# Patient Record
Sex: Female | Born: 1935 | Race: White | Hispanic: No | Smoking: Never smoker
Health system: Southern US, Community
[De-identification: ages and names within clinical notes are randomized; demographics above are authoritative.]

## PROBLEM LIST (undated history)

## (undated) DIAGNOSIS — K219 Gastro-esophageal reflux disease without esophagitis: Secondary | ICD-10-CM

## (undated) DIAGNOSIS — R55 Syncope and collapse: Secondary | ICD-10-CM

## (undated) DIAGNOSIS — R911 Solitary pulmonary nodule: Secondary | ICD-10-CM

## (undated) DIAGNOSIS — R112 Nausea with vomiting, unspecified: Secondary | ICD-10-CM

## (undated) DIAGNOSIS — Z9889 Other specified postprocedural states: Secondary | ICD-10-CM

## (undated) DIAGNOSIS — I639 Cerebral infarction, unspecified: Secondary | ICD-10-CM

## (undated) DIAGNOSIS — K559 Vascular disorder of intestine, unspecified: Secondary | ICD-10-CM

## (undated) DIAGNOSIS — I509 Heart failure, unspecified: Secondary | ICD-10-CM

## (undated) DIAGNOSIS — I2510 Atherosclerotic heart disease of native coronary artery without angina pectoris: Secondary | ICD-10-CM

## (undated) DIAGNOSIS — I10 Essential (primary) hypertension: Secondary | ICD-10-CM

## (undated) DIAGNOSIS — K5792 Diverticulitis of intestine, part unspecified, without perforation or abscess without bleeding: Secondary | ICD-10-CM

## (undated) DIAGNOSIS — E785 Hyperlipidemia, unspecified: Secondary | ICD-10-CM

## (undated) DIAGNOSIS — I4891 Unspecified atrial fibrillation: Secondary | ICD-10-CM

## (undated) DIAGNOSIS — I252 Old myocardial infarction: Secondary | ICD-10-CM

## (undated) HISTORY — PX: KNEE SURGERY: SHX244

## (undated) HISTORY — DX: Heart failure, unspecified: I50.9

## (undated) HISTORY — DX: Hyperlipidemia, unspecified: E78.5

## (undated) HISTORY — DX: Gastro-esophageal reflux disease without esophagitis: K21.9

## (undated) HISTORY — DX: Solitary pulmonary nodule: R91.1

## (undated) HISTORY — DX: Diverticulitis of intestine, part unspecified, without perforation or abscess without bleeding: K57.92

## (undated) HISTORY — DX: Vascular disorder of intestine, unspecified: K55.9

## (undated) HISTORY — DX: Syncope and collapse: R55

## (undated) HISTORY — DX: Unspecified atrial fibrillation: I48.91

## (undated) HISTORY — DX: Atherosclerotic heart disease of native coronary artery without angina pectoris: I25.10

---

## 1998-01-28 ENCOUNTER — Inpatient Hospital Stay (HOSPITAL_COMMUNITY): Admission: AD | Admit: 1998-01-28 | Discharge: 1998-02-01 | Payer: Self-pay | Admitting: Internal Medicine

## 1999-06-18 ENCOUNTER — Inpatient Hospital Stay (HOSPITAL_COMMUNITY): Admission: EM | Admit: 1999-06-18 | Discharge: 1999-06-20 | Payer: Self-pay | Admitting: Cardiovascular Disease

## 2000-07-24 ENCOUNTER — Inpatient Hospital Stay (HOSPITAL_COMMUNITY): Admission: AD | Admit: 2000-07-24 | Discharge: 2000-07-26 | Payer: Self-pay | Admitting: Cardiology

## 2000-07-24 ENCOUNTER — Encounter: Payer: Self-pay | Admitting: Internal Medicine

## 2001-11-06 ENCOUNTER — Encounter: Payer: Self-pay | Admitting: Internal Medicine

## 2001-11-06 ENCOUNTER — Ambulatory Visit (HOSPITAL_COMMUNITY): Admission: RE | Admit: 2001-11-06 | Discharge: 2001-11-06 | Payer: Self-pay | Admitting: Internal Medicine

## 2001-12-29 ENCOUNTER — Inpatient Hospital Stay (HOSPITAL_COMMUNITY): Admission: EM | Admit: 2001-12-29 | Discharge: 2002-01-01 | Payer: Self-pay | Admitting: *Deleted

## 2001-12-29 ENCOUNTER — Encounter: Payer: Self-pay | Admitting: *Deleted

## 2001-12-29 ENCOUNTER — Encounter: Payer: Self-pay | Admitting: Internal Medicine

## 2002-05-22 ENCOUNTER — Ambulatory Visit (HOSPITAL_COMMUNITY): Admission: RE | Admit: 2002-05-22 | Discharge: 2002-05-22 | Payer: Self-pay | Admitting: Orthopedic Surgery

## 2002-05-22 ENCOUNTER — Encounter: Payer: Self-pay | Admitting: Orthopedic Surgery

## 2002-05-26 ENCOUNTER — Inpatient Hospital Stay (HOSPITAL_COMMUNITY): Admission: RE | Admit: 2002-05-26 | Discharge: 2002-05-28 | Payer: Self-pay | Admitting: Orthopedic Surgery

## 2002-11-11 ENCOUNTER — Ambulatory Visit (HOSPITAL_COMMUNITY): Admission: RE | Admit: 2002-11-11 | Discharge: 2002-11-11 | Payer: Self-pay | Admitting: Pulmonary Disease

## 2003-05-13 ENCOUNTER — Ambulatory Visit (HOSPITAL_COMMUNITY): Admission: RE | Admit: 2003-05-13 | Discharge: 2003-05-13 | Payer: Self-pay | Admitting: Urology

## 2003-11-12 ENCOUNTER — Ambulatory Visit (HOSPITAL_COMMUNITY): Admission: RE | Admit: 2003-11-12 | Discharge: 2003-11-12 | Payer: Self-pay | Admitting: Internal Medicine

## 2004-04-09 DIAGNOSIS — K559 Vascular disorder of intestine, unspecified: Secondary | ICD-10-CM

## 2004-04-09 HISTORY — PX: COLONOSCOPY: SHX174

## 2004-04-09 HISTORY — DX: Vascular disorder of intestine, unspecified: K55.9

## 2004-08-06 ENCOUNTER — Ambulatory Visit: Payer: Self-pay | Admitting: Cardiovascular Disease

## 2004-08-06 ENCOUNTER — Inpatient Hospital Stay (HOSPITAL_COMMUNITY): Admission: AD | Admit: 2004-08-06 | Discharge: 2004-08-07 | Payer: Self-pay | Admitting: Cardiovascular Disease

## 2004-08-06 ENCOUNTER — Encounter: Payer: Self-pay | Admitting: Emergency Medicine

## 2004-08-14 ENCOUNTER — Ambulatory Visit: Payer: Self-pay | Admitting: Cardiovascular Disease

## 2004-11-13 ENCOUNTER — Ambulatory Visit (HOSPITAL_COMMUNITY): Admission: RE | Admit: 2004-11-13 | Discharge: 2004-11-13 | Payer: Self-pay | Admitting: Internal Medicine

## 2004-11-14 ENCOUNTER — Ambulatory Visit: Payer: Self-pay | Admitting: Cardiology

## 2004-12-05 ENCOUNTER — Ambulatory Visit: Payer: Self-pay | Admitting: Cardiovascular Disease

## 2005-01-21 ENCOUNTER — Ambulatory Visit: Payer: Self-pay | Admitting: Internal Medicine

## 2005-01-22 ENCOUNTER — Inpatient Hospital Stay (HOSPITAL_COMMUNITY): Admission: EM | Admit: 2005-01-22 | Discharge: 2005-01-24 | Payer: Self-pay | Admitting: Emergency Medicine

## 2005-01-23 ENCOUNTER — Encounter (INDEPENDENT_AMBULATORY_CARE_PROVIDER_SITE_OTHER): Payer: Self-pay | Admitting: Internal Medicine

## 2005-02-08 ENCOUNTER — Ambulatory Visit (HOSPITAL_COMMUNITY): Admission: RE | Admit: 2005-02-08 | Discharge: 2005-02-08 | Payer: Self-pay | Admitting: Internal Medicine

## 2005-11-12 ENCOUNTER — Ambulatory Visit (HOSPITAL_COMMUNITY): Admission: RE | Admit: 2005-11-12 | Discharge: 2005-11-12 | Payer: Self-pay | Admitting: Internal Medicine

## 2005-12-12 ENCOUNTER — Ambulatory Visit (HOSPITAL_COMMUNITY): Admission: RE | Admit: 2005-12-12 | Discharge: 2005-12-12 | Payer: Self-pay | Admitting: Internal Medicine

## 2006-12-16 ENCOUNTER — Ambulatory Visit (HOSPITAL_COMMUNITY): Admission: RE | Admit: 2006-12-16 | Discharge: 2006-12-16 | Payer: Self-pay | Admitting: Internal Medicine

## 2007-02-14 ENCOUNTER — Emergency Department (HOSPITAL_COMMUNITY): Admission: EM | Admit: 2007-02-14 | Discharge: 2007-02-14 | Payer: Self-pay | Admitting: Emergency Medicine

## 2007-10-10 ENCOUNTER — Emergency Department (HOSPITAL_COMMUNITY): Admission: EM | Admit: 2007-10-10 | Discharge: 2007-10-10 | Payer: Self-pay | Admitting: Emergency Medicine

## 2007-12-18 ENCOUNTER — Ambulatory Visit (HOSPITAL_COMMUNITY): Admission: RE | Admit: 2007-12-18 | Discharge: 2007-12-18 | Payer: Self-pay | Admitting: Internal Medicine

## 2008-04-25 ENCOUNTER — Emergency Department (HOSPITAL_COMMUNITY): Admission: EM | Admit: 2008-04-25 | Discharge: 2008-04-25 | Payer: Self-pay | Admitting: Emergency Medicine

## 2008-12-04 ENCOUNTER — Inpatient Hospital Stay (HOSPITAL_COMMUNITY): Admission: EM | Admit: 2008-12-04 | Discharge: 2008-12-07 | Payer: Self-pay | Admitting: Emergency Medicine

## 2009-02-08 ENCOUNTER — Ambulatory Visit (HOSPITAL_COMMUNITY): Admission: RE | Admit: 2009-02-08 | Discharge: 2009-02-08 | Payer: Self-pay | Admitting: Internal Medicine

## 2009-03-15 ENCOUNTER — Ambulatory Visit: Payer: Self-pay | Admitting: Cardiology

## 2009-03-16 ENCOUNTER — Ambulatory Visit: Payer: Self-pay | Admitting: Vascular Surgery

## 2009-03-16 ENCOUNTER — Inpatient Hospital Stay (HOSPITAL_COMMUNITY): Admission: EM | Admit: 2009-03-16 | Discharge: 2009-03-24 | Payer: Self-pay | Admitting: Emergency Medicine

## 2009-03-16 ENCOUNTER — Encounter (INDEPENDENT_AMBULATORY_CARE_PROVIDER_SITE_OTHER): Payer: Self-pay | Admitting: Internal Medicine

## 2009-03-18 ENCOUNTER — Ambulatory Visit: Payer: Self-pay | Admitting: Physical Medicine & Rehabilitation

## 2009-03-24 ENCOUNTER — Inpatient Hospital Stay (HOSPITAL_COMMUNITY)
Admission: RE | Admit: 2009-03-24 | Discharge: 2009-04-06 | Payer: Self-pay | Admitting: Physical Medicine & Rehabilitation

## 2009-03-25 ENCOUNTER — Ambulatory Visit: Payer: Self-pay | Admitting: Physical Medicine & Rehabilitation

## 2009-04-12 ENCOUNTER — Ambulatory Visit (HOSPITAL_COMMUNITY): Admission: RE | Admit: 2009-04-12 | Discharge: 2009-04-12 | Payer: Self-pay | Admitting: Internal Medicine

## 2009-11-17 ENCOUNTER — Emergency Department (HOSPITAL_COMMUNITY): Admission: EM | Admit: 2009-11-17 | Discharge: 2009-11-17 | Payer: Self-pay | Admitting: Internal Medicine

## 2010-04-30 ENCOUNTER — Encounter: Payer: Self-pay | Admitting: Internal Medicine

## 2010-04-30 ENCOUNTER — Encounter: Payer: Self-pay | Admitting: Physical Medicine & Rehabilitation

## 2010-05-01 ENCOUNTER — Encounter: Payer: Self-pay | Admitting: Internal Medicine

## 2010-06-23 LAB — URINE CULTURE
Colony Count: >=100000
Culture  Setup Time: 201108120044

## 2010-06-23 LAB — BASIC METABOLIC PANEL
BUN: 22 mg/dL (ref 6–23)
CO2: 28 mEq/L (ref 19–32)
Chloride: 104 mEq/L (ref 96–112)
Creatinine, Ser: 1.3 mg/dL — ABNORMAL HIGH (ref 0.4–1.2)

## 2010-06-23 LAB — DIFFERENTIAL
Basophils Absolute: 0 10*3/uL (ref 0.0–0.1)
Basophils Relative: 0 % (ref 0–1)
Eosinophils Relative: 5 % (ref 0–5)
Monocytes Absolute: 1.1 10*3/uL — ABNORMAL HIGH (ref 0.1–1.0)

## 2010-06-23 LAB — CBC
HCT: 34.1 % — ABNORMAL LOW (ref 36.0–46.0)
MCH: 28.1 pg (ref 26.0–34.0)
MCHC: 33 g/dL (ref 30.0–36.0)
MCV: 85.1 fL (ref 78.0–100.0)
RDW: 17.9 % — ABNORMAL HIGH (ref 11.5–15.5)

## 2010-06-23 LAB — POCT CARDIAC MARKERS

## 2010-06-23 LAB — URINE MICROSCOPIC-ADD ON

## 2010-06-23 LAB — URINALYSIS, ROUTINE W REFLEX MICROSCOPIC
Bilirubin Urine: NEGATIVE
Ketones, ur: NEGATIVE mg/dL
Nitrite: POSITIVE — AB
Urobilinogen, UA: 0.2 mg/dL (ref 0.0–1.0)
pH: 5.5 (ref 5.0–8.0)

## 2010-07-10 LAB — URINALYSIS, ROUTINE W REFLEX MICROSCOPIC
Bilirubin Urine: NEGATIVE
Hgb urine dipstick: NEGATIVE
Ketones, ur: NEGATIVE mg/dL
Protein, ur: 100 mg/dL — AB
Urobilinogen, UA: 0.2 mg/dL (ref 0.0–1.0)

## 2010-07-10 LAB — BASIC METABOLIC PANEL
BUN: 12 mg/dL (ref 6–23)
CO2: 27 mEq/L (ref 19–32)
CO2: 28 mEq/L (ref 19–32)
Calcium: 8.7 mg/dL (ref 8.4–10.5)
Calcium: 8.9 mg/dL (ref 8.4–10.5)
Chloride: 101 mEq/L (ref 96–112)
Chloride: 102 mEq/L (ref 96–112)
Chloride: 114 mEq/L — ABNORMAL HIGH (ref 96–112)
Creatinine, Ser: 0.7 mg/dL (ref 0.4–1.2)
Creatinine, Ser: 0.84 mg/dL (ref 0.4–1.2)
Creatinine, Ser: 0.88 mg/dL (ref 0.4–1.2)
GFR calc Af Amer: >60 mL/min (ref >60)
GFR calc non Af Amer: >60 mL/min (ref >60)
GFR calc non Af Amer: >60 mL/min (ref >60)
Glucose, Bld: 105 mg/dL — ABNORMAL HIGH (ref 70–99)
Glucose, Bld: 105 mg/dL — ABNORMAL HIGH (ref 70–99)
Glucose, Bld: 123 mg/dL — ABNORMAL HIGH (ref 70–99)
Glucose, Bld: 128 mg/dL — ABNORMAL HIGH (ref 70–99)
Potassium: 3.4 mEq/L — ABNORMAL LOW (ref 3.5–5.1)
Potassium: 3.4 mEq/L — ABNORMAL LOW (ref 3.5–5.1)
Potassium: 3.7 mEq/L (ref 3.5–5.1)
Sodium: 136 mEq/L (ref 135–145)
Sodium: 138 mEq/L (ref 135–145)

## 2010-07-10 LAB — URINE CULTURE: Colony Count: 1000

## 2010-07-10 LAB — COMPREHENSIVE METABOLIC PANEL
BUN: 13 mg/dL (ref 6–23)
CO2: 28 mEq/L (ref 19–32)
Calcium: 8.4 mg/dL (ref 8.4–10.5)
Chloride: 109 mEq/L (ref 96–112)
Creatinine, Ser: 0.69 mg/dL (ref 0.4–1.2)
GFR calc non Af Amer: >60 mL/min (ref >60)
Glucose, Bld: 103 mg/dL — ABNORMAL HIGH (ref 70–99)
Total Bilirubin: 0.6 mg/dL (ref 0.3–1.2)

## 2010-07-10 LAB — CBC
HCT: 30 % — ABNORMAL LOW (ref 36.0–46.0)
HCT: 30.5 % — ABNORMAL LOW (ref 36.0–46.0)
HCT: 30.6 % — ABNORMAL LOW (ref 36.0–46.0)
HCT: 32.6 % — ABNORMAL LOW (ref 36.0–46.0)
Hemoglobin: 9.8 g/dL — ABNORMAL LOW (ref 12.0–15.0)
Hemoglobin: 9.9 g/dL — ABNORMAL LOW (ref 12.0–15.0)
Hemoglobin: 9.9 g/dL — ABNORMAL LOW (ref 12.0–15.0)
MCV: 79.1 fL (ref 78.0–100.0)
MCV: 79.6 fL (ref 78.0–100.0)
MCV: 80.4 fL (ref 78.0–100.0)
MCV: 80.6 fL (ref 78.0–100.0)
Platelets: 212 10*3/uL (ref 150–400)
Platelets: 249 10*3/uL (ref 150–400)
RBC: 3.73 MIL/uL — ABNORMAL LOW (ref 3.87–5.11)
RBC: 3.79 MIL/uL — ABNORMAL LOW (ref 3.87–5.11)
RDW: 16.4 % — ABNORMAL HIGH (ref 11.5–15.5)
RDW: 17 % — ABNORMAL HIGH (ref 11.5–15.5)
WBC: 14.4 10*3/uL — ABNORMAL HIGH (ref 4.0–10.5)
WBC: 8.2 10*3/uL (ref 4.0–10.5)
WBC: 8.4 10*3/uL (ref 4.0–10.5)

## 2010-07-10 LAB — DIFFERENTIAL
Basophils Relative: 0 % (ref 0–1)
Lymphocytes Relative: 15 % (ref 12–46)
Monocytes Relative: 8 % (ref 3–12)
Neutro Abs: 6 10*3/uL (ref 1.7–7.7)
Neutrophils Relative %: 73 % (ref 43–77)

## 2010-07-10 LAB — PROTIME-INR: INR: 1.58 — ABNORMAL HIGH (ref 0.00–1.49)

## 2010-07-10 LAB — CULTURE, BLOOD (ROUTINE X 2)
Culture: NO GROWTH
Culture: NO GROWTH

## 2010-07-11 LAB — PROTIME-INR
INR: 1.15 (ref 0.00–1.49)
INR: 1.45 (ref 0.00–1.49)
INR: 1.74 — ABNORMAL HIGH (ref 0.00–1.49)
INR: 1.84 — ABNORMAL HIGH (ref 0.00–1.49)
INR: 1.93 — ABNORMAL HIGH (ref 0.00–1.49)
INR: 1.99 — ABNORMAL HIGH (ref 0.00–1.49)
Prothrombin Time: 14.6 seconds (ref 11.6–15.2)
Prothrombin Time: 17.5 seconds — ABNORMAL HIGH (ref 11.6–15.2)
Prothrombin Time: 21.1 seconds — ABNORMAL HIGH (ref 11.6–15.2)
Prothrombin Time: 22.4 seconds — ABNORMAL HIGH (ref 11.6–15.2)

## 2010-07-11 LAB — BRAIN NATRIURETIC PEPTIDE: Pro B Natriuretic peptide (BNP): 844 pg/mL — ABNORMAL HIGH (ref 0.0–100.0)

## 2010-07-11 LAB — CARDIAC PANEL(CRET KIN+CKTOT+MB+TROPI)
CK, MB: 1.1 ng/mL (ref 0.3–4.0)
Relative Index: 0.6 (ref 0.0–2.5)
Total CK: 106 U/L (ref 7–177)
Total CK: 159 U/L (ref 7–177)
Total CK: 166 U/L (ref 7–177)
Total CK: 200 U/L — ABNORMAL HIGH (ref 7–177)
Troponin I: 0.02 ng/mL (ref 0.00–0.06)
Troponin I: 0.03 ng/mL (ref 0.00–0.06)
Troponin I: 0.03 ng/mL (ref 0.00–0.06)

## 2010-07-11 LAB — COMPREHENSIVE METABOLIC PANEL
BUN: 18 mg/dL (ref 6–23)
Calcium: 8.8 mg/dL (ref 8.4–10.5)
Creatinine, Ser: 1.05 mg/dL (ref 0.4–1.2)
Glucose, Bld: 132 mg/dL — ABNORMAL HIGH (ref 70–99)
Total Protein: 6.9 g/dL (ref 6.0–8.3)

## 2010-07-11 LAB — CBC
HCT: 31.6 % — ABNORMAL LOW (ref 36.0–46.0)
Hemoglobin: 10.3 g/dL — ABNORMAL LOW (ref 12.0–15.0)
Hemoglobin: 9.4 g/dL — ABNORMAL LOW (ref 12.0–15.0)
MCHC: 32 g/dL (ref 30.0–36.0)
MCHC: 32.9 g/dL (ref 30.0–36.0)
MCHC: 32.9 g/dL (ref 30.0–36.0)
MCV: 80.7 fL (ref 78.0–100.0)
Platelets: 197 10*3/uL (ref 150–400)
Platelets: 200 10*3/uL (ref 150–400)
Platelets: 258 10*3/uL (ref 150–400)
RBC: 3.42 MIL/uL — ABNORMAL LOW (ref 3.87–5.11)
RBC: 3.56 MIL/uL — ABNORMAL LOW (ref 3.87–5.11)
RBC: 3.93 MIL/uL (ref 3.87–5.11)
RDW: 15.8 % — ABNORMAL HIGH (ref 11.5–15.5)
RDW: 16 % — ABNORMAL HIGH (ref 11.5–15.5)
WBC: 7.5 10*3/uL (ref 4.0–10.5)

## 2010-07-11 LAB — BASIC METABOLIC PANEL
BUN: 11 mg/dL (ref 6–23)
BUN: 15 mg/dL (ref 6–23)
BUN: 21 mg/dL (ref 6–23)
CO2: 25 mEq/L (ref 19–32)
CO2: 27 mEq/L (ref 19–32)
Calcium: 8.3 mg/dL — ABNORMAL LOW (ref 8.4–10.5)
Calcium: 8.4 mg/dL (ref 8.4–10.5)
Calcium: 8.5 mg/dL (ref 8.4–10.5)
Calcium: 8.9 mg/dL (ref 8.4–10.5)
Chloride: 107 mEq/L (ref 96–112)
Creatinine, Ser: 0.96 mg/dL (ref 0.4–1.2)
Creatinine, Ser: 1.08 mg/dL (ref 0.4–1.2)
GFR calc Af Amer: 58 mL/min — ABNORMAL LOW (ref >60)
GFR calc Af Amer: >60 mL/min (ref >60)
GFR calc Af Amer: >60 mL/min (ref >60)
GFR calc non Af Amer: >60 mL/min (ref >60)
Glucose, Bld: 107 mg/dL — ABNORMAL HIGH (ref 70–99)
Potassium: 2.9 mEq/L — ABNORMAL LOW (ref 3.5–5.1)
Sodium: 140 mEq/L (ref 135–145)
Sodium: 147 mEq/L — ABNORMAL HIGH (ref 135–145)

## 2010-07-11 LAB — DIFFERENTIAL
Lymphs Abs: 1.6 10*3/uL (ref 0.7–4.0)
Monocytes Relative: 13 % — ABNORMAL HIGH (ref 3–12)
Neutro Abs: 4.2 10*3/uL (ref 1.7–7.7)
Neutrophils Relative %: 60 % (ref 43–77)

## 2010-07-11 LAB — CK TOTAL AND CKMB (NOT AT ARMC)
CK, MB: 1.8 ng/mL (ref 0.3–4.0)
Total CK: 100 U/L (ref 7–177)
Total CK: 120 U/L (ref 7–177)

## 2010-07-11 LAB — IRON AND TIBC
Iron: 19 ug/dL — ABNORMAL LOW (ref 42–135)
Saturation Ratios: 6 % — ABNORMAL LOW (ref 20–55)
TIBC: 340 ug/dL (ref 250–470)

## 2010-07-11 LAB — TSH: TSH: 6.563 u[IU]/mL — ABNORMAL HIGH (ref 0.350–4.500)

## 2010-07-11 LAB — LIPID PANEL: VLDL: 21 mg/dL (ref 0–40)

## 2010-07-11 LAB — VITAMIN B12: Vitamin B-12: 1306 pg/mL — ABNORMAL HIGH (ref 211–911)

## 2010-07-11 LAB — HEMOGLOBIN A1C: Mean Plasma Glucose: 120 mg/dL

## 2010-07-11 LAB — APTT: aPTT: 30 seconds (ref 24–37)

## 2010-07-15 LAB — DIFFERENTIAL
Basophils Absolute: 0 10*3/uL (ref 0.0–0.1)
Basophils Relative: 0 % (ref 0–1)
Eosinophils Relative: 2 % (ref 0–5)
Monocytes Absolute: 0.7 10*3/uL (ref 0.1–1.0)

## 2010-07-15 LAB — CBC
HCT: 38.7 % (ref 36.0–46.0)
Platelets: 196 10*3/uL (ref 150–400)
RBC: 4.64 MIL/uL (ref 3.87–5.11)
WBC: 9.6 10*3/uL (ref 4.0–10.5)

## 2010-07-15 LAB — URINALYSIS, ROUTINE W REFLEX MICROSCOPIC
Glucose, UA: NEGATIVE mg/dL
Hgb urine dipstick: NEGATIVE
Ketones, ur: NEGATIVE mg/dL
pH: 6 (ref 5.0–8.0)

## 2010-07-15 LAB — URINE MICROSCOPIC-ADD ON

## 2010-07-15 LAB — COMPREHENSIVE METABOLIC PANEL
AST: 22 U/L (ref 0–37)
Albumin: 3.3 g/dL — ABNORMAL LOW (ref 3.5–5.2)
Alkaline Phosphatase: 92 U/L (ref 39–117)
BUN: 11 mg/dL (ref 6–23)
Chloride: 105 mEq/L (ref 96–112)
GFR calc Af Amer: >60 mL/min (ref >60)
Potassium: 3.5 mEq/L (ref 3.5–5.1)
Total Bilirubin: 1 mg/dL (ref 0.3–1.2)

## 2010-07-15 LAB — POCT I-STAT, CHEM 8
BUN: 27 mg/dL — ABNORMAL HIGH (ref 6–23)
Calcium, Ion: 1.16 mmol/L (ref 1.12–1.32)
Chloride: 104 mEq/L (ref 96–112)
Creatinine, Ser: 1.7 mg/dL — ABNORMAL HIGH (ref 0.4–1.2)
Glucose, Bld: 150 mg/dL — ABNORMAL HIGH (ref 70–99)

## 2010-08-22 NOTE — H&P (Signed)
NAMECARMON, [name]             ACCOUNT NO.:  [id number]   MEDICAL RECORD NO.:  [id number]          PATIENT TYPE:  INP   LOCATION:  A331                          FACILITY:  [facility]   PHYSICIAN:  Melvyn Novas, MDDATE OF BIRTH:  [birth year]   DATE OF ADMISSION:  12/04/2008  DATE OF DISCHARGE:  LH                              HISTORY & PHYSICAL   HISTORY OF PRESENT ILLNESS:  The patient is an 77 year old white female  who lives alone.  Patient of Dr. Alonza Smoker.  She apparently was making the  bed and fell, tripped over something and landed on the wooden part of  the bed.  She complained of severe right-sided chest pain, was found to  have an acute refracture of the seventh rib and acute versus chronic  fracture of the right 9th rib.  She denies any dyspnea.  She does have  some pleuritic pain on deep inspiration.  No cough, sputum, or  hemoptysis.  The patient denies any antecedent dizziness, anginal chest  pain, or palpitations.  She is simply tripped.  She is admitted for  analgesia and incentive spirometry and perhaps physical therapy.   PAST MEDICAL HISTORY:  Significant for minimal coronary artery disease,  hypertension, history of congestive heart failure, cerebral aneurysm,  GERD, sigmoid diverticulosis, extensive hyperlipidemia, and hiatal  hernia.   PAST SURGICAL HISTORY:  Remarkable for cholecystectomy, knee surgery,  and heart catheterization.   ALLERGIES:  She has no known allergies.   CURRENT MEDICATIONS:  1. Benicar 40 p.o. daily.  2. Imdur 30 mg p.o. b.i.d.  3. Aspirin 81 mg p.o. daily.  4. Zocor 40 mg p.o. daily.  5. Losartan 100 mg as listed.  6. Prilosec 20 mg p.o. daily.  7. Norvasc 10 mg p.o. daily.   PHYSICAL EXAMINATION:  VITAL SIGNS:  Blood pressure 143/76, temperature  98.3, respirations of 22, O2 sat is 95%, pulse is 60 and regular.  HEENT:  Eyes, PERRLA.  Extraocular movement intact.  Sclerae clear.  Conjunctivae pink.  NECK:  No JVD.  No  carotid bruits.  No thyromegaly or thyroid bruits.  LUNGS:  Prolonged expiratory phase.  No rales, wheeze, or rhonchi  appreciable.  HEART:  Regular rhythm.  A 1/6 aortic outflow murmur.  No S3, S4, or  gallops.  No heaves, thrills, or rubs.  ABDOMEN:  Soft and nontender.  Bowel sounds normoactive.  No guarding,  rebound, or hepatosplenomegaly.  EXTREMITIES:  Trace to 1+ pedal edema.  NEUROLOGIC:  The patient is alert and oriented.  Cranial nerves grossly  intact.  The patient moves all 4 extremities.  Plantars are downgoing.   IMPRESSION:  1. Right acute seventh rib fracture and possible acute versus chronic      ninth rib fracture.  2. Atelectasis based on chest x-ray.  3. Hypertension.  4. Minimal coronary artery disease.  5. Hiatal hernia.  6. Hyperlipidemia.  7. Hypertension.   PLAN:  Plan is to admit, continue all current orders, intravenous and  opioid analgesia, incentive spirometry, to instruct on usage, perhaps  physical therapy and Lovenox prophylaxis.  I will make further  recommendations as the database expands.      Melvyn Novas, MD  Electronically Signed     RMD/MEDQ  D:  12/04/2008  T:  12/05/2008  Job:  [id number]

## 2010-08-22 NOTE — Discharge Summary (Signed)
NAMEHARLEIGH, [name]             ACCOUNT NO.:  [id number]   MEDICAL RECORD NO.:  [id number]          PATIENT TYPE:  INP   LOCATION:  A331                          FACILITY:  [facility]   PHYSICIAN:  Kingsley Callander. Ouida Sills, MD       DATE OF BIRTH:  [birth year]   DATE OF ADMISSION:  12/04/2008  DATE OF DISCHARGE:  08/31/2010LH                               DISCHARGE SUMMARY   DISCHARGE DIAGNOSES:  1. Acute fractures of the right 7th and 9th ribs  2. Atelectasis.  3. Coronary artery disease.  4. Urinary tract infection.  5. Paroxysmal atrial fibrillation.  6. Hypertension.  7. Congestive heart failure.  8. Gastroesophageal reflux disease.  9. Bilateral supraclinoid internal carotid artery aneurysms.   HOSPITAL COURSE:  This patient is an 77 year old female who fell at home  while making her bed.  She experienced severe right-sided chest pain.  She was evaluated in the emergency room with CT scans of her head,  abdomen, chest and pelvis.  She was found to have acute fractures of the  right 7th and 9th ribs.  Atelectasis was present.  She was treated with  morphine.  She was treated with incentive spirometry.  She had  paroxysmal atrial fibrillation and amlodipine was switched to diltiazem.  She has a mildly elevated TSH at 5.3.  Her electrolytes were normal.  Her hemoglobin was 12.9.  She developed nausea with her pain medication.  She felt that she could make it at home with Tylenol or ibuprofen alone  without narcotics.  She was improved and stable for discharge on the  morning of the 31st.  She will be seen in follow-up in the office in  approximately 10 days.   DISCHARGE MEDICATIONS:  1. Cipro 250 mg b.i.d. for 7 days.  2. Diltiazem CD 240 mg daily.  3. Benicar 40 mg daily.  4. Isosorbide mononitrate 30 mg b.i.d.  5. Aspirin 81 mg daily.  6. Simvastatin 40 mg q.h.s.  7. Detrol 4 mg daily.  8. Lasix 20 mg daily.  9. Omeprazole 20 mg daily.  10.Norvasc will be stopped.      Kingsley Callander.  Ouida Sills, MD  Electronically Signed     ROF/MEDQ  D:  12/07/2008  T:  12/07/2008  Job:  [id number]

## 2010-08-25 NOTE — Discharge Summary (Signed)
   [name], [name]                       ACCOUNT NO.:  [id number]   MEDICAL RECORD NO.:  [id number]                   PATIENT TYPE:  INP   LOCATION:  A218                                 FACILITY:  [facility]   PHYSICIAN:  Edward L. Juanetta Gosling, M.D.             DATE OF BIRTH:  12/11/1917   DATE OF ADMISSION:  12/29/2001  DATE OF DISCHARGE:  01/01/2002                                 DISCHARGE SUMMARY   FINAL DISCHARGE DIAGNOSES:  1. Acute diverticulitis.  2. Brief episode of syncope.  3. Urinary tract infection.  4. Hypertension.  5. Coronary artery occlusive disease.  6. Hypokalemia.   HISTORY:  The patient is an 77 year old who has a history of coronary artery  occlusive disease, history of diverticulitis in the past, who had been doing  well until the day of admission, at which point she started having diarrhea.  She had soft mucoid bloody stools associated with abdominal pain, passed out  for about 10 seconds, was revived by her relatives.  She had fever and  chills as well.   PHYSICAL EXAMINATION:  CHEST:  Relatively clear.  HEART:  Regular.  ABDOMEN:  Tender.  EXTREMITIES:  No edema.  VITAL SIGNS:  Heart rate 101, blood pressure 190/80, temperature 101.1  degrees.   LABORATORY DATA:  White blood count was 14,900.  Her potassium was 3.3, BUN  23, creatinine 1.7.  Urine showed many white blood cells.   HOSPITAL COURSE:  She was started on Cipro and Flagyl, given IV fluids, had  multiple cardiac enzymes to rule out myocardial infarction, had a CT scan  which showed evidence of diverticulitis, and improved over the next several  days.  By the time of discharge, she was able to take medications orally.  She was eating without any nausea, vomiting, or diarrhea and she was  discharged home in improved condition.   DISCHARGE MEDICATIONS:  1. Metoprolol 100 mg b.i.d.  2. Imdur 30 mg daily.  3. HCTZ 25 mg daily.  4. Altace 10 mg daily.  5. Prilosec 20 mg daily.  6. Aspirin  325 mg daily.  7.     Multiple vitamins.  8. Cipro 500 mb b.i.d. x7 days.  9. Flagyl 500 mg t.i.d. x7 days.                                               Edward L. Juanetta Gosling, M.D.    ELH/MEDQ  D:  01/01/2002  T:  01/01/2002  Job:  [id number]

## 2010-08-25 NOTE — Op Note (Signed)
[name], VENTURA                       ACCOUNT NO.:  [id number]   MEDICAL RECORD NO.:  [id number]                   PATIENT TYPE:  INP   LOCATION:  2891                                 FACILITY:  [facility]   PHYSICIAN:  Lenard Galloway. Chaney Malling, M.D.           DATE OF BIRTH:  [birth year]   DATE OF PROCEDURE:  05/26/2002  DATE OF DISCHARGE:                                 OPERATIVE REPORT   PREOPERATIVE DIAGNOSIS:  Old fracture, right patella, with loose patellar  prosthetic component.   POSTOPERATIVE DIAGNOSES:  Old fracture, right patella, with malunion; loose  patellar total knee prosthesis, tight lateral retinaculum and synovitis,  right knee.   OPERATION:  Synovectomy of right knee; partial patellectomy; lateral  release, removed patellar prosthesis.   SURGEON:  Lenard Galloway. Chaney Malling, M.D.   ASSISTANT:  Legrand Pitts. Duffy, P.A.   ANESTHESIA:  General.   PROCEDURE:  The patient was placed on the operating table in a supine  position.  Pneumatic tourniquet was placed about the right upper thigh.  There was ecchymosis and swelling about the ankle.  Prior to prepping and  draping, Doppler studies were done and dorsalis pedis and posterior tibial  pulses were 2+.  C-arm was used and x-ray was taken of the foot and the  tibia and fibula, all the way up to the knee.  No fractures were seen.  She  does have a history of a rupture popliteal cyst and it was felt that  swelling and ecchymosis about the ankle was related to the popliteal cyst  problem and it was elected to proceed with surgery.   The right lower extremity was prepped with Duraprep and draped out in the  usual manner.  Vi Drape was placed over the operative site.  The right leg  was then wrapped out with an Esmarch and tourniquet elevated.  Incision was  made at the anterior aspect of the knee and through the previous total knee  incision.  The skin edges were retracted.  A long medial parapatellar  incision was then done  and there was old blood in this area.  The sutures  were removed.  The knee was opened and the patellar was completely loose and  floating around her knee.  No damage had occurred to the femoral component.  The articular surface of the tibial poly was intact.  The knee was opened  completely and the patella everted.  There was a great deal of synovitis and  a synovectomy was done.  Some loose pieces of glue were found in the knee.  There were multiple fragments to the patella.  The majority of the patella  was __________ with the lateral femoral condyle adhesed in this area.  Fractures of the patella were found medially and these were each  individually excised out of the retinaculum.  A generous lateral release was  then done and this allowed the patella to be realigned in  the midline.  Part  of the medial retinaculum was excised.  __________  suture and pulsed saline  lavage were used and antibiotic solution was used.  Medial retinaculum was  then closed with interrupted sutures, three along the patella in the  midline.  The tourniquet was dropped and all bleeders were coagulated.  A  Hemovac was then placed in the wound.  Subcutaneous tissue was closed with 2-  0 Vicryl and skin closed with stainless steel staples.  The knee was quite  stable in flexion and extension and with varus and valgus stress.  A large  bulky compressive dressing was applied and the patient returned to the  recovery room in excellent condition.                                              Rodney A. Chaney Malling, M.D.   RAM/MEDQ  D:  05/26/2002  T:  05/26/2002  Job:  [id number]

## 2010-08-25 NOTE — Consult Note (Signed)
NAMESHERISA, GILVIN             ACCOUNT NO.:  [id number]   MEDICAL RECORD NO.:  [id number]          PATIENT TYPE:  INP   LOCATION:  A206                          FACILITY:  [facility]   PHYSICIAN:  Stefani Dama, M.D.  DATE OF BIRTH:  06-20-1917   DATE OF CONSULTATION:  01/21/2005  DATE OF DISCHARGE:                                   CONSULTATION   REQUESTOR:  Rhae Lerner. Margretta Ditty, M.D., [facility] Emergency [facility].   REASON FOR REQUEST:  Syncopal episode, abnormal CT scan.   HISTORY OF PRESENT ILLNESS:  Ms. Alexy Bringle is an 77 year old  individual who presented to [facility] Emergency Room after an apparent  syncopal episode.  She fell forward, striking her head.  CT scan was  obtained.  This demonstrated the presence of normal intracranial contents.  However, there was evidence of a right-sided mass in the region of the  carotid artery consistent with the possibility of a carotid artery aneurysm.  Consultation was sought for further evaluation of this process.  Clinically,  the patient has been stable.  She has not had any cardiac arrhythmias while  in the emergency department.  She has a normal blood pressure, and she has  no significant history of hypertension or other abnormalities.  According to  Dr. Margretta Ditty, her neurologic exam was completely within the limits of  normal.  I reviewed the CT scan and I note that there is the possibility  that there is a 13 mm aneurysm in the region of the internal carotid artery  on the right side.  I discussed the situation with Dr. Earlyne Iba, and  I indicated that I feel the aneurysm is an incidental finding.  I do not  believe that it is likely related to her syncopal episode.  I suggested that  these aneurysms would best be treated most conservatively, and noting that  the patient has normal neurologic examination, I would advise ruling out all  other reasons for the possibility of syncope.  I would be glad to evaluate  the  patient on an outpatient basis to discuss the significance of the  aneurysm and the risks of treatment versus nontreatment.  In this age group,  I would strongly favor nontreatment.  I obtained the patient's phone number,  which was [phone], or [phone], and will be arranging for an outpatient  visit for this patient.      Stefani Dama, M.D.  Electronically Signed     HJE/MEDQ  D:  01/22/2005  T:  01/22/2005  Job:  [id number]   cc:   Kingsley Callander. Ouida Sills, MD  Fax: [phone]   Rhae Lerner. Margretta Ditty, M.D.  [address]  [place]  [place] [address]

## 2010-08-25 NOTE — H&P (Signed)
NAME:  [name], [name]                       ACCOUNT NO.:  [id number]   MEDICAL RECORD NO.:  [id number]                   PATIENT TYPE:  INP   LOCATION:  5030                                 FACILITY:  [facility]   PHYSICIAN:  Lenard Galloway. Chaney Malling, M.D.           DATE OF BIRTH:  [birth year]   DATE OF ADMISSION:  05/26/2002  DATE OF DISCHARGE:  05/28/2002                                HISTORY & PHYSICAL   CHIEF COMPLAINT:  Painful right total knee for further weeks.   HISTORY OF PRESENT ILLNESS:  This 77 year old white female patient presented  to Dr. Chaney Malling with a history of a right knee replacement in August of  1990 and then a revision of her right knee replacement in October of 1998.  The initial two surgeries were done by Dr. Priscille Kluver.  She had been having  difficulty four weeks prior to admission with sudden onset of right knee  pain.  She had no known injury or any other surgery to the knee other than  noted previously.  Three days prior to admission she said she could not move  her knee at all.  Again, no fall or injury to the knee.  She has had no  fever or chills and no real night pain.  She has complained of pain just  with weightbearing and it seems to be in the posterior thigh and calf.  Leg  does feel a little numb and tingly.  There is no pain at night.  It does  seem to swell on and off.  She has been taking Tylenol with minimal relief  of the pain. She has been ambulating with a cane for the last three days.   ALLERGIES:  No known drug allergies.   CURRENT MEDICATIONS:  1. Hydrochlorothiazide 25 mg one tablet p.o. daily.  2. Altace 10 mg one tablet p.o. daily.  3. Metoprolol 100 mg one tablet p.o. b.i.d.  4. Aspirin 81 mg one tablet p.o. daily, last dose May 18, 2002.  5. Isosorbide 30 mg one tablet p.o. daily.  6. Prilosec 20 mg one tablet p.o. daily.   PAST MEDICAL HISTORY:  1. Hypertension for three years.  2. Myocardial infarction in 2000 and she  recently has had a negative stress     test by Dr. Eden Emms.  3. She denies any history of diabetes mellitus, thyroid disease,  hiatal     hernia, reflux, peptic ulcer disease, asthma or any other chronic medical     condition other than noted previously.   PAST SURGICAL HISTORY:  1. Left knee arthroscopy November 03, 1978, by Dr. Priscille Kluver.  2. Right knee arthroscopy December 11, 1979, by Dr. Priscille Kluver.  3. Left knee arthroscopy July 20, 1981, by Dr. Jonny Ruiz L. Rendall.  4. Right total knee arthroplasty November 16, 1988 by Dr. Jonny Ruiz L. Rendall.  5. Revision right total knee arthroplasty January 27, 1997, by Dr. Jonny Ruiz L.  Rendall.  6. Laparoscopic cholecystectomy 2003 by Dr. Jefm Bryant.  7. Removal of bilateral cataracts.   She denies any complications from the above mentioned procedures.   SOCIAL HISTORY:  She denies any history of cigarettes smoking, alcohol use  or drug use.  She is a widow and has four children.  She lives by herself in  a one story house with two steps into the main entrance.  Her daughter does  come stay with her at night.  She is retired from [employer].  Her medical doctor is Dr. Eden Emms of [place].   FAMILY HISTORY:  Her mother died at the age of 78 with tuberculosis.  Her  father died at the age of 34 with myocardial infarction.  She has one  brother alive with coronary artery disease and three two died, two with  myocardial infarctions and one with a car accident.  She has four sisters  who are alive, one with coronary artery disease, one with leukemia, and one  with diabetes and coronary artery disease.  She had one sister who passed  away with a myocardial infarction.  She has four children, one daughter with  osteoarthritis, one with diabetes and then two sons who are health.   REVIEW OF SYSTEMS:  She does complain of some pain in her right knee and  some cramping at times.  She does have urinary urgency and nocturia once a  night, some frequency. She  does have some arthritis in her fingers.  She  wears glasses.  She has a living will and her power of attorney is [employer].   PHYSICAL EXAMINATION:  GENERAL APPEARANCE:  A well-developed, well-nourished  white female in no acute distress.  She walks with use of a cane.  Mood and  affect are appropriate.  She talks easily with the examiner.  She is  accompanied by her daughter.  VITAL SIGNS:  Height is 5 feet 9 inches, weight 186 pounds.  Temperature  98.3 degrees F, pulse 80, respiratory rate 16, blood pressure 142/74.  HEENT:  Normocephalic and atraumatic without frontal or maxillary sinus  tenderness to palpation.  Conjunctiva pink.  Sclerae are anicteric.  PERRLA.  EOM intact.  No visible external ear deformities.  Hearing grossly intact.  Tympanic membranes are pearly gray bilaterally with good light reflex.  Nose  and nasal septum midline.  Nasal mucosa pink and moist without exudates or  polyps noted.  Buckle mucosa pink and moist.  Good dentition.  Pharynx  without erythema or exudates.  Tongue and uvula midline. Tongue without  fasciculations and uvula rises equally with phonation.  NECK:  No visible masses or lesions noted.  Trachea midline.  No palpable  lymphadenopathy nor thyromegaly.  She has decreased range of motion of her  cervical spine on lateral bending and rotation bilaterally but this does  appear to be even.  Minimal pain with these movements.  No pain with  palpation along the neck.  RESPIRATORY:  Respirations are even and unlabored.  Breath sounds clear to  auscultation bilaterally without rales or wheezes noted.  CARDIOVASCULAR:  Regular rate and rhythm.  S1 and S2 present without rubs,  clicks, or murmurs noted.  ABDOMEN:  Rounded abdominal contour.  Bowel sounds present x4 quadrants.  Soft, nontender to palpation without hepatosplenomegaly nor CVA tenderness. Femoral pulses +2 bilaterally.  Nontender to palpation along the entire  length of the  vertebral column.  BREASTS/GU/RECTAL/PELVIC:  These examinations are deferred at this time.  MUSCULOSKELETAL:  She has no obvious deformities in bilateral upper  extremities with full range of motion of these extremities without pain.  Radial pulses +2 bilaterally.  Full range of motion of her hips, ankles and  toes bilaterally.  DP and PT pulses are +2.  Left knee lacks about 25  degrees of full extension, can flex to 120 degrees but a moderate amount of  crepitus with range of motion.  No real pain with palpation.  She does  appear to be in about 20 degrees of a valgus position and she does have a  lateral incision line noted that is well healed.  No pain with palpation  along the joint line, negative Homan's and stable to varus and valgus  stress.  Right knee is lacking about 15 degrees of full extension, can flex  to 115 degrees.  There is a well healed midline incision.  There is minimal  crepitus with range of motion of the knee and she appears to have a 30  degree extension line.  She does have pain with palpation in the chest.  No  real pain with palpation along the joint line.  Stable to varus and valgus  stress.  NEUROLOGIC:  The patient is alert and oriented x3.  Cranial nerves II-XII  grossly intact.  Strength 5/5 bilateral upper and lower extremities.  Rapid  alternating movements intact.  Deep tendon reflexes 2+ bilateral upper and  lower extremities.   RADIOLOGIC FINDINGS:  Three views taken of her right knee in February of  2004, showed the tibial space normal.  Femoral and tibial components are in  good position with no toggle or no loosening noted.  There is severe  deformity noted about the patella and the sunrise view shows what appears to  be a fracture of the patella and/or loosening of the patellar component.  There is very little bone stalk available there.   IMPRESSION:  1. Probable loose patellar component right total knee.  2. Hypertension.  3. Coronary  artery disease with history of myocardial infarction in 2000.   PLAN:  The patient will be admitted to [facility] on  May 26, 2002, when she will undergo a revision of her patellar  component of her right total knee arthroplasty by Dr. Rinaldo Ratel.  She  will undergo all the routine preoperative laboratory tests and studies prior  to this procedure.     Legrand Pitts Duffy, P.A.                      Rodney A. Chaney Malling, M.D.    KED/MEDQ  D:  08/13/2002  T:  08/14/2002  Job:  [id number]

## 2010-08-25 NOTE — Discharge Summary (Signed)
. [facility]  Patient:    [name], [name]                      MRN: [id number] Adm. Date:  06/18/99 Disc. Date: 06/20/99 Attending:  Noralyn Pick. Eden Emms, M.D. [facility] Dictator:   Tereso Newcomer, P.A.-C.                  Referring Physician Discharge [facility]  DATE OF BIRTH:  Aug 12, 1917  DISCHARGE DIAGNOSES: 1. Coronary artery disease.  Coronary angiography, October 1999, revealing left    main 20%; left anterior descending artery 30% proximal, 40% mid; circumflex    20% proximal; right coronary artery totally occluded, with left-to-right    collateralization; normal left ventricular function; treated medically. 2. Admission for chest pain, ruled out for myocardial infarction by enzymes, spiral    CT negative for pulmonary embolism.  Repeat catheterization normal left main;    left anterior descending artery 30% proximal, 40% distal; right coronary    artery with total occlusion, left-to-right collateralization; ejection    fraction 70%. 3. Status post right total knee arthroplasty. 4. Hypertension. 5. Syncope. 6. Increased lipids. 7. Acute parascapular pain.  ADMISSION HISTORY:  This 77 year old female with known CAD (see above) was at church the morning of admission.  She suddenly developed parascapular pain. She lost consciousness and awoke on the floor.  EMS was called.  Apparently, she had a second syncopal episode in the ambulance with no telemetry changes.  She had continued back pain with relief by two nitroglycerin at [facility] emergency department.  EKG at [facility] showed normal sinus rhythm and no acute abnormalities.  She also stated that she had a syncopal episode two weeks prior, at home, that was similar to the day of admission, although at that time she had no back pain.  She was subsequently transferred to [facility] for workup.   LABORATORY FROM [facility]:  WBC 8.4, hemoglobin 13.8,  hematocrit 41.5, platelet count 257.  PTT 25.4, INR 1.27.  Sodium 139, potassium 4.0, chloride 97, CO2 30, glucose 123, BUN 23, creatinine 1.3.  D-dimer slightly elevated. Urinalysis negative.  Troponin I 0.0.  CPK 136, CK-MB 3.  ADMISSION PHYSICAL EXAMINATION:  GENERAL:  Revealed a female in no apparent distress.  HEART:  Regular rate and rhythm without significant murmur.  LUNGS:  Clear to auscultation.  EXTREMITIES:  Without edema.  HOSPITAL COURSE:  Patient was admitted to [facility] under rule out MI  protocol.  Her CK enzymes were as follows:  total 162, CK-MB 2.0, troponin I less than 0.03; third CK done in all - total 148, CK-MB 1.8, troponin I less than 0.03. The patient remained stable.  She had a spiral CT performed on June 18, 1999 that showed cardiomegaly, COPD, coronary artery calcification, and no evidence for pulmonary embolism.  She also had a lumbar and thoracic spine x-ray performed on June 19, 1999.  This showed L4-L5 degenerative disk space narrowing with L4-5 nd L5-S1, past that joint, arthritic changes bilaterally.  No evidence for fracture or subluxation on thoracic spine x-ray.  On June 20, 1999, the patient was taken o the cardiac catheterization laboratory.  Her catheterization report is as above. This showed no progression of coronary artery disease.  It was felt that she was stable enough to be discharged on June 20, 1999.  Question was given to some GI complaints that she had had in the  past as attributing to her symptoms for this  admission.  It was recommended to her that she should follow-up with her doctor, Dr. Juanetta Gosling, about this, and possibly have gastroenterology workup.  It was also noted that during her admission, she had trace leukocytes and few bacteria noted in the urinalysis.  Urine culture was collected and was pending at time of discharge.  DISCHARGE LABORATORY DATA:  WBC 6.9, hemoglobin 13.1, hematocrit  38.4, platelet  count 261.  Sodium 141, potassium 4.0, chloride 102, CO2 30, BUN 20, creatinine  1.4, glucose 121.  TSH 1.348.  DISCHARGE MEDICATIONS: 1. Protonix 40 mg q.d. 2. Enteric-coated aspirin 325 mg q.d. 3. HCTZ 25 mg q.d. 4. Altace 10 mg q.d. 5. Clonidine 0.2 mg b.i.d. 6. Isosorbide 30 mg q.d. 7. Metoprolol 100 mg q.d. 8. Norvasc 10 mg q.d. (this was added due to systolic hypertension noted during her    admission, while on her present antihypertensive regimen).  ACTIVITY:  No driving, sexual activity, heavy lifting, or exertion for three days.  DIET:  Low fat/low sodium/low cholesterol diet.  WOUND CARE:  Watch groin for increased swelling, bleeding, or redness.  Call our office with any concerns.  FOLLOW-UP AND SPECIAL INSTRUCTIONS:  Patient is to follow up with Dr. Juanetta Gosling in one to two weeks.  She should follow up with Dr. Eden Emms in six months.  She should have Dr. Juanetta Gosling repeat a urinalysis at follow-up.  She should also inquire about having a gastroenterology workup after follow-up with Dr. Juanetta Gosling. DD:  06/20/99 TD:  06/20/99 Job: [id number]

## 2010-08-25 NOTE — Cardiovascular Report (Signed)
[place]. [facility]  Patient:    [name], [name]                    MRN: [id number] Proc. Date: 06/20/99 Adm. Date:  [id number] Attending:  Colon Branch                        Cardiac Catheterization  INDICATION:  Chest pain and syncope, with history of coronary disease.  FINDINGS: 1. LEFT MAIN:  Normal. 2. LEFT ANTERIOR DESCENDING:  Has 30% tubular lesion proximally.  The distal    vessel had a 40% tubular lesion; these were unchanged from the study done    in 1999.  The first diagonal branch was normal. 3. CIRCUMFLEX CORONARY ARTERY:  A large vessel, normal.  There were two obtuse    marginal branches which were entirely normal.  There were some left-to-right  collaterals to the PDA. 4. RIGHT CORONARY ARTERY:  Known to be previously occluded.  It was 100% occluded    proximally, with some bridging collaterals to the distal vessel.  RAO VENTRICULOGRAPHY:  Revealed inferior apical wall hypokinesis.  Ejection fraction is preserved with a 70% range.  HEMODYNAMICS: 1. Aortic pressure:  168/82. 2. Left ventricular pressure:  168/18.  The patient was somewhat more hypertensive than this during the case, and was given nitroglycerin sublingually.  The aortic root injections done due to the patients pain between the scapula.  There was no evidence of aortic dissection.  AORTOGRAM:  Since she had significant hypertension, we did a distal aortogram looking at her renal arteries.  These were widely patent.  CLOSURE:  The right femoral artery was closed using Perclose device with good hemostasis.  IMPRESSION:  The patients chest pain and presyncope would appear to be noncardiac in etiology.  She is not ruled in for myocardial infarction and has not had progression of her disease.  She has had a negative CT for pulmonary embolus and has no evidence of aortic dissection.  She will be discharged home later today.  She will follow up with Dr.  Juanetta Gosling and Dr. Vanessa Kick.  I suspect she also needs further GI workup as she has significant reflux symptoms and diarrhea. DD:  06/20/99 TD:  06/19/99 Job: [id number]

## 2010-08-25 NOTE — Group Therapy Note (Signed)
   [name], [name]                       ACCOUNT NO.:  [id number]   MEDICAL RECORD NO.:  [id number]                   PATIENT TYPE:  INP   LOCATION:  A218                                 FACILITY:  [facility]   PHYSICIAN:  Edward L. Juanetta Gosling, M.D.             DATE OF BIRTH:  1917/09/28   DATE OF PROCEDURE:  DATE OF DISCHARGE:                                   PROGRESS NOTE   PROBLEM:  Probable diverticulitis.   SUBJECTIVE:  This patient was admitted last night with what appears to be  diverticulitis.  She has had abdominal discomfort, nausea, and diarrhea.  She says that she feels a little bit better this morning.  She has no other  new complaints.   OBJECTIVE:  Her exam this morning shows that she is afebrile.  Her chest is  fairly clear.  Her heart is regular.  Her abdomen is soft.  According to  her, she has already had a CT scan, but I do not have a report as yet.   ASSESSMENT:  She has what appears to be diverticulitis.   PLAN:  The plan is to continue with treatments and medicines, no changes  today, and will decrease her Cipro.  She is on a higher dose than typical  for a patient who has mild decrease in renal function.                                               Edward L. Juanetta Gosling, M.D.    ELH/MEDQ  D:  12/30/2001  T:  12/31/2001  Job:  [id number]

## 2010-08-25 NOTE — Cardiovascular Report (Signed)
[place]. [facility]  Patient:    [name], [name]                    MRN: [id number] Proc. Date: 07/26/00 Adm. Date:  [id number] Disc. Date: [id number] Attending:  Learta Codding CC:         Arna Snipe, M.D.  Noralyn Pick. Eden Emms, M.D. [facility]   Cardiac Catheterization  PRIMARY CARE PHYSICIAN:  Arna Snipe, M.D.  CARDIOLOGIST:  Noralyn Pick. Eden Emms, M.D.  PROCEDURE:  Left heart catheterization/coronary arteriography.  INDICATIONS:  Evaluate patient with known coronary disease and recurrent chest discomfort.  PROCEDURAL NOTE:  A left heart catheterization was performed via the right femoral artery.  The artery was cannulated using an anterior wall puncture.  A 6-French arterial sheath was inserted via the modified Seldinger technique. Preformed Judkins and a pigtail catheter were utilized.  The patient tolerated the procedure well and left the lab in stable condition.  HEMODYNAMICS:  LV 176/18, AO 182/78.  CORONARIES: 1. The left main was normal. 2. The LAD had a long mid 50% lesion which was slightly progressed compared to    the 2001 examination. 3. The circumflex was the large vessel with luminal irregularities. 4. The right coronary artery appeared to be a dominant vessel and was occluded    proximally.  There was scant left-to-right collaterals.  Left ventriculogram:  A left ventriculogram was obtained in the RAO projection.  The EF was 70% with well preserved wall motion.  CONCLUSIONS:  Single-vessel coronary artery disease with an occluded right coronary artery.  Nonobstructive disease elsewhere.  PLAN:  The patient will continue to have medical management for her known coronary disease with continued risk factor modification.  No further cardiovascular testing is planned at this point. DD:  07/26/00 TD:  07/26/00 Job: [id number]

## 2010-08-25 NOTE — Group Therapy Note (Signed)
   [name], [name]                       ACCOUNT NO.:  [id number]   MEDICAL RECORD NO.:  [id number]                   PATIENT TYPE:  INP   LOCATION:  A218                                 FACILITY:  [facility]   PHYSICIAN:  Edward L. Juanetta Gosling, M.D.             DATE OF BIRTH:  [birth year]   DATE OF PROCEDURE:  01/01/2002  DATE OF DISCHARGE:  01/01/2002                                   PROGRESS NOTE   PROBLEMS:  1. Diverticulitis.  2. Coronary artery occlusive disease.   SUBJECTIVE:  The patient says that she is feeling very well and wants to go  home.  She has no complaints at all this morning.   OBJECTIVE:  Her physical exam shows that her temperature, the max yesterday  was 98.6, blood pressure 150/70, pulse 78, respirations 18.  Her abdomen is  soft except she had some minimal tenderness on very deep palpation.  Bowel  sounds are active.  Her chest is clear.  Her heart is regular.   ASSESSMENT:  She seems to be doing well.   PLAN:  For discharge today if she tolerates oral medications.  Please see  discharge summary for details.                                                Edward L. Juanetta Gosling, M.D.    ELH/MEDQ  D:  01/01/2002  T:  01/02/2002  Job:  [id number]

## 2010-08-25 NOTE — H&P (Signed)
NAME:  [name], [name]                       ACCOUNT NO.:  [id number]   MEDICAL RECORD NO.:  [id number]                   PATIENT TYPE:  INP   LOCATION:  A226                                 FACILITY:  [facility]   PHYSICIAN:  Sarita Bottom, M.D.                  DATE OF BIRTH:  Mar 24, 1918   DATE OF ADMISSION:  12/29/2001  DATE OF DISCHARGE:                                HISTORY & PHYSICAL   REASON FOR ADMISSION:  1. Diverticulitis.  2. Syncope, rule out myocardial infarction.  3. Urinary tract infection.   CHIEF COMPLAINT:  I have had diarrhea and abdominal pain for one day and I  passed out.   HISTORY OF PRESENT ILLNESS:  The patient is an 77 year old lady with a  history of coronary artery disease and diverticulitis.  She was apparently  well until this afternoon when she started having diarrhea.  Her stools are  described as soft, mucoid and bloody associated with abdominal pain.  She  said she passed out for about 10 minutes and she was revived by her  relatives.  She admits to having a fever and chills.  She denies any  vomiting, but admits having nausea.  She denies any chest pain or  palpitations.   PAST MEDICAL HISTORY:  1. Coronary artery disease, status post cardiac catheterization, dates     unknown.  2. History of right knee replacement.  3. History of diverticulitis one year ago.   MEDICATIONS:  1. Metoprolol 100 mg b.i.d.  2. Imdur 20 mg q.d.  3. Hydrochlorothiazide 25 mg q.d.  4. Altace 10 mg q.d.  5. Omeprazole 20 mg q.d.  6. Aspirin 325 mg q.d.  7. Vitamins.   ALLERGIES:  No known drug allergies.   FAMILY HISTORY:  Significant for cancer in her sister and her mother.  Heart  disease with her father.   SOCIAL HISTORY:  She is a widow and lives alone.  She is independent of  activities of daily living.  She does not smoke or drink alcohol.   REVIEW OF SYMPTOMS:  GENERAL:  She admits to having malaise and fever.  RESPIRATORY:  She admits to having a  cough which is nonproductive.  CARDIOVASCULAR:  She denies any chest pain or palpitations.  GI:  She admits  to having abdominal pain, nausea and diarrhea.  GENITOURINARY:  She denies  any dysuria, but admits to incontinence.  NEUROLOGIC:  She denies any  weakness, but admits to having dizziness sometimes.  PSYCHIATRIC:  Denies  any depression.  SKIN:  She denies any rashes or ulcers.  EXTREMITIES:  No  edema.  BACK/SPINE:  She admits to having some back pain.   PHYSICAL EXAMINATION:  VITAL SIGNS:  BP 189/86, heart rate 101, respirations  18, temperature 101.1 degrees Fahrenheit.  GENERAL:  She was an elderly lady lying on a stretcher in mild distress.  HEENT:  The patient was  not pale.  She was anicteric.  Pupils equal round  and reactive to light and accommodation.  She had a dry coated tongue.  NECK:  Supple.  No lymphadenopathy.  No carotid bruits.  CHEST:  Clear to auscultation.  HEART:  S1, S2 normal, regular rate and rhythm.  No murmurs were  appreciated.  ABDOMEN:  Soft with no guarding.  She had tenderness in the right and left  lower abdominal quadrants.  No masses were felt.  The liver and spleen were  not enlarged.  Bowel sounds were present and normal.  NEUROLOGIC:  She was alert and oriented x3 and there were no focal  neurological deficits.  EXTREMITIES:  She did not have any pedal edema.  The pulses were 2+  bilaterally.  RECTAL:  She had blood and mucoid stool on the examining finger which were  guaiac positive.   LABORATORY DATA AND X-RAY FINDINGS:  WBC 14.9, hemoglobin 13.3, hematocrit  39.8, MCV 88.6, platelet count 239, neutrophils 86%, lymphs 6%, monos 8%.  Sodium 137, potassium 3.3, chloride 103, CO2 30, BUN 23, creatinine 1.7,  glucose 92, calcium 8.4.  Total bilirubin 0.5, Alk phos 96, SGOT 21, SGPT  18, total protein 7.1, albumin 3.1.  UA was yellow and clear with specific  gravity of 1.015, nitrite positive, many wbc's 3-6.   Abdominal x-ray unremarkable  for any bowel gas pattern.   ASSESSMENT/PLAN:  1. Acute diverticulitis.  2. Syncope to rule out myocardial infarction, rule out arrhythmia, rule out     transient ischemic attacks.  3. Urinary tract infection.  4. Hypertension.  5. Coronary artery disease.  6. Mild hypokalemia.   PLAN:  1. Admit the patient to telemetry.  2. Give IV hydration, D-5-W plus normal saline at 100 cc per hour.  3. NPO.  4. IV antibiotics with ciprofloxacin started in the emergency room.  5. Continue Cipro and add metronidazole for anaerobic coverage.  6. Schedule CT of the abdomen.  7. Give IV Demerol 25 mg q.4h. p.r.n. pain.  8. Blood cultures x2 sets.  9. WBC, cardiac enzymes and EKG q.6h. to rule out any MI.  10.      Resume current medications of Metoprolol 100 mg b.i.d., Imdur 30 mg     q.d., hydrochlorothiazide 25 mg p.o. q.d., aspirin 325 mg q.d.  11.     Give potassium to replace lost potassium with 20 mEq of potassium IV.  12.      Repeat her basic metabolic panel after potassium.  13.      Notify primary M.D., Dr. Juanetta Gosling to follow the patient's clinical     course.                                               Sarita Bottom, M.D.    DW/MEDQ  D:  12/29/2001  T:  12/30/2001  Job:  [id number]

## 2010-08-25 NOTE — Discharge Summary (Signed)
[place]. [facility]  Patient:    PRINCE, OLIVIER                    MRN: [id number] Adm. Date:  [id number] Disc. Date: [id number] Attending:  Learta Codding Dictator:   Lavella Hammock, P.A. CC:         Noralyn Pick. Eden Emms, M.D. [facility]  Dr. Jinny Sanders   Referring Physician Discharge [facility]  DATE OF BIRTH:  07/19/1917  PROCEDURES: 1. Cardiac catheterization. 2. Coronary arteriogram. 3. Left ventriculogram.  HOSPITAL COURSE:  Mrs. Sanko is an 77 year old female who was transferred to [facility] from [facility] for evaluation of chest pain.  She has a prior history of MI, as well as hypertension and diverticular disease.  She had an elevated D-dimer at [facility], but the CT scan was negative for PE or DVT.  She was admitted to [facility] for further evaluation and treatment.  HOSPITAL COURSE:  Cardiac enzymes were negative for MI and she was scheduled for a cardiac catheterization.  This was done on July 26, 2000, and it showed a normal left main and an LAD with a long mid 50% lesion in the circumflex with luminal irregularities.  The RCA was totaled with left-to-right collaterals.  Her EF was 70%.  It was felt that she had single-vessel coronary artery disease with nonobstructive disease otherwise.  The plan was medical management.  She was considered for discharge on July 26, 2000, p.m.  LABORATORY VALUES:  Serial CK-MBs and troponins Is negative for MI.  The sodium is 141, potassium 3.5, chloride 106, CO2 27, BUN 18, creatinine 1.4, and glucose 118.  Hemoglobin 11.9, hematocrit 35.9, WBC 7.5, platelets 258.  Chest CT:  No evidence of acute pulmonary emboli or aortic dissection.  No other acute findings.  Chest x-ray:  Minimal cardiomegaly with no active disease.  DISCHARGE CONDITION:  Stable.  CONSULTS:  None.  COMPLICATIONS:  None.  DISCHARGE DIAGNOSES: 1. Chest pain,  single-vessel coronary artery disease, medical therapy    recommended. 2. Status post myocardial infarction x 2 in the past. 3. Hypertension. 4. History of diverticular disease. 5. Status post cholecystectomy. 6. Hyperlipidemia, status post reaction to last statin tried. 7. Status post right total knee replacement. 8. Family history of coronary artery disease.  DISCHARGE INSTRUCTIONS: 1. Her activity level is to include no steps for one week, no driving for two    days, and no strenuous activity for two days. 2. She is to stick to a low-fat diet. 3. She is to call the office for bleeding, swelling, or drainage of the    catheterization site. 4. She is to see Dr. Jinny Sanders in a p.r.n. basis. 5. She is to follow up with Theron Arista C. Eden Emms, M.D., as arranged.  DISCHARGE MEDICATIONS: 1. Centrum Silver q.d. 2. Aspirin 325 mg q.d. 3. Clonidine 0.2 mg b.i.d. 4. Nexium 40 mg q.d. 5. Altace 10 mg q.d. 6. Imdur 30 mg q.d. 7. Metoprolol 100 mg b.i.d. 8. HCTZ 25 mg p.o. q.d. DD:  07/26/00 TD:  07/26/00 Job: [id number]

## 2010-08-25 NOTE — H&P (Signed)
[name], [name]             ACCOUNT NO.:  [id number]   MEDICAL RECORD NO.:  [id number]          PATIENT TYPE:  INP   LOCATION:  A206                          FACILITY:  [facility]   PHYSICIAN:  [name]. Dawn Sills, MD       DATE OF BIRTH:  08-14-1917   DATE OF ADMISSION:  01/21/2005  DATE OF DISCHARGE:  LH                                HISTORY & PHYSICAL   CHIEF COMPLAINT:  Nausea and vomiting.   HISTORY OF PRESENT ILLNESS:  This patient is an 77 year old white female  with coronary artery disease who presented to the emergency room after she  was found by her daughters to be experiencing nausea and vomiting.  She then  had an episode of nearly passing out.  She was brought to the emergency room  where she was found to have no signs of hemodynamic instability.  She had  experienced some crampy abdominal pain but no diarrhea.  She had been well  one day previously.  She did not experience chest pain.  Cardiac markers  were negative.  She did not experience shortness of breath, diaphoresis, or  definite loss of consciousness.  She had not experienced hematemesis.   PAST MEDICAL HISTORY:  1.  Coronary artery disease status post two myocardial infarctions.  Last      catheterization was in 2002.  She had a 100% RCA lesion.  Her LV      function was normal.  2.  Cholecystectomy.  3.  History of diverticulitis.  4.  Status post right total knee replacement.  5.  Hypertension.  6.  Chronic microscopic hematuria.  7.  Negative adenosine Myoview stress test in May of 2006.  8.  GERD.   MEDICATIONS:  1.  Metoprolol 100 mg b.i.d.  2.  Altace 10 mg daily.  3.  Imdur 30 mg b.i.d.  4.  Aspirin 81 mg daily.  5.  Prilosec 20 mg daily.  6.  Lipitor 10 mg daily.  7.  Vitamin B12 500 mcg daily.  8.  Centrum Silver multivitamin daily.  9.  Hydrochlorothiazide 25 mg daily.   ALLERGIES:  NONE.   SOCIAL HISTORY:  She was never a smoker.  She does not drink alcohol.  She  does not use  recreational drugs.  She had previously worked at [employer] and  [employer].   FAMILY HISTORY:  Her mother died at 60 of stomach cancer.  Her father died  at 79 of a MI.  A sister has died of a MI.  Three brothers have died of MI.   REVIEW OF SYSTEMS:  Noncontributory.   PHYSICAL EXAMINATION:  VITAL SIGNS:  Temperature 98.1, pulse 66,  respirations 20, blood pressure 134/50, oxygen saturation 98%.  GENERAL:  Alert and oriented and in no distress.  HEENT:  The eyes and oropharynx are unremarkable.  NECK:  Supple, without lymphadenopathy or thyromegaly.  LUNGS:  Clear.  HEART:  Regular with no murmurs.  ABDOMEN:  Mildly tender across the lower quadrants, with no palpable  organomegaly or mass.  EXTREMITIES:  No clubbing, cyanosis, or edema.  NEUROLOGIC:  Grossly intact.   LABORATORY DATA:  White count 10.8, hemoglobin 12.2, platelets 221,000.  Sodium 137, potassium 3.2, bicarb 26, glucose 132, BUN 28, creatinine 1.3,  calcium 8.1.  CK-MB 1.7, troponin I less than 0.05.   EKG reveals sinus bradycardia at 57 beats per minute with left ventricular  hypertrophy, poor R wave progression, and nonspecific T wave changes.   IMPRESSION:  1.  Nausea, vomiting, near-syncope.  She is being hospitalized overnight for      observation.  This may simply be a viral gastrointestinal illness.  She      has shown no sign of cardiac arrhythmias on cardiac monitoring to      explain her near-syncopal episode.  2.  Hypokalemia.  Will supplement her potassium intravenously.  3.  Coronary artery disease, stable.  4.  Hypertension, controlled.  5.  Hyperlipidemia, stable.  6.  Degenerative joint disease.      [name]. Dawn Sills, MD  Electronically Signed     ROF/MEDQ  D:  01/22/2005  T:  01/22/2005  Job:  [id number]

## 2010-08-25 NOTE — Consult Note (Signed)
NAMEDEATRICE, SPANBAUER             ACCOUNT NO.:  [id number]   MEDICAL RECORD NO.:  [id number]          PATIENT TYPE:  INP   LOCATION:  A206                          FACILITY:  [facility]   PHYSICIAN:  Lionel December, M.D.    DATE OF BIRTH:  1917-10-09   DATE OF CONSULTATION:  01/22/2005  DATE OF DISCHARGE:                                   CONSULTATION   REQUESTING PHYSICIAN:  Dr. Ouida Sills.   REASON FOR CONSULTATION:  Bright red blood per rectum.   HISTORY OF PRESENT ILLNESS:  Ms. Whitehair is an 77 year old Caucasian female  who was admitted yesterday.  Her daughter states that she went to the  bathroom around 9:50.  She was having crampy abdominal pain.  She was then  found two hours later by her daughter unresponsive with her head in the  sink.  She was transferred to the hospital via ambulance.  Once then she  continued to complain of severe abdominal pain which was crampy in nature.  She also had some nausea, diaphoresis, and weakness.  She vomited once  yesterday.  Through the evening and night she has had at least four more  episodes of bloody diarrhea.  Reportedly one of the stools was hemoccult-  negative.  She notes pain continues to be 10/10 at worst.  Her pain is now  intermittent and is responding well to pain medications.  Prior to this her  bowel movements have been normal, nonbloody.  She denies any history of  constipation or diarrhea.  She does have occasional heartburn and  indigestion and takes Prilosec 20 mg on a daily basis.  Denies any dysphagia  or odynophagia.   She did have a colonoscopy March 08, 2000.  She had colonic  diverticulosis in the ascending and descending and left colon.  Dr. Karilyn Cota  was unable to reach the cecum, therefore barium enema was ordered which  revealed multiple diverticula.  She also had EGD at the same time which  revealed erosive reflux esophagitis and a small hiatal hernia.   PAST MEDICAL HISTORY:  1.  Hypertension.  2.   Hypercholesterolemia.  3.  TCS and EGD as described in the HPI.  4.  Coronary artery disease.  5.  Obesity.  6.  GERD.  7.  Right knee replacement.  8.  Cholecystectomy.   MEDICATIONS PRIOR TO ADMISSION:  1.  Vitamin B12 500 mg daily.  2.  Altace 10 mg daily.  3.  Hydrochlorothiazide 25 mg daily.  4.  Lipitor 20 mg daily.  5.  Isosorbide MN 30 mg b.i.d.  6.  Metoprolol 100 mg b.i.d.  7.  Aspirin 81 mg daily.  8.  Prilosec 20 mg daily.  9.  Tylenol p.r.n.   ALLERGIES:  No known drug allergies.   FAMILY HISTORY:  Positive for one brother with Crohn's disease.  Mother  deceased secondary to TB.  Father deceased secondary to MI.  She has one  sister with leukemia, one with coronary artery disease.   SOCIAL HISTORY:  Ms. Gargan is currently a widow.  She lives alone.  She has  four grown  healthy children.  She denies any tobacco, alcohol, or drug use.  She is retired from [employer].   REVIEW OF SYSTEMS:  CONSTITUTIONAL:  Weight is stable.  Denies any anorexia.  Denies any fatigue.  Denies any early satiety.  CARDIOVASCULAR:  Denies any  chest pain or palpitations.  PULMONARY:  Denies any shortness of breath,  dyspnea, cough, or hemoptysis.  GI:  See HPI.  NEUROLOGIC:  Denies any  headache.  She does complain of generalized weakness.  No paresthesias.   PHYSICAL EXAMINATION:  VITAL SIGNS:  Height 69 inches, temperature 97.2,  pulse 77, respirations 20, blood pressure 155/72.  GENERAL:  Ms. Droege is an 77 year old elderly-appearing Caucasian female who  is alert, pleasant, cooperative, no acute distress.  HEENT:  Sclerae clear, non-icteric.  Conjunctivae pink.  Oropharynx pink and  moist without any lesions.  NECK:  Supple without any mass or thyromegaly.  CHEST:  Heart regular rate and rhythm with normal S1, S2.  LUNGS:  Clear to auscultation bilaterally.  ABDOMEN:  Positive bowel sounds x4.  No bruits auscultated.  Soft,  nontender, nondistended without palpable mass or  hepatosplenomegaly.  No  rebound tenderness or guarding.  RECTAL:  She does have some rectal excoriation circumferentially around the  anal sphincter approximately 2 cm duration.  No internal masses palpated.  She has good sphincter tone.  Upon removal there is significant amount of  bright red blood on the glove which is gross.  EXTREMITIES:  Without edema bilaterally.   LABORATORIES:  WBC 10.8, hemoglobin 12.2, hematocrit 36.7, platelets 221.  CK-MB 1.6, troponin less than 0.05, myoglobin 24.6.  Calcium 8.1.  Sodium  137, potassium 4.6, chloride 105, CO2 26, BUN 28, creatinine 1.3, glucose  132.   IMPRESSION:  Ms. Pacini is an 77 year old Caucasian female with a two-day  history of severe cramping, low abdominal pain, nausea, and bright red  rectal bleeding with loose stools.  She was found unresponsive by her  daughter yesterday on the commode.  She was diaphoretic and weak.  She has  since had four episodes of rectal bleeding here.  Her abdominal pain is  intermittent 10/10 at worst.  She does respond well to pain medications.  Last colonoscopy was November 2001 which showed significant diverticulosis.   I suspect she has diverticular bleed versus ischemic colitis.  Given the  fact that she has not had a colonoscopy in five years we need to find the  source of her bleeding.  She also has history of chronic gastroesophageal  reflux disease, well controlled on Prilosec.   PLAN:  1.  CBC now.  2.  Obtain consent for colonoscopy tomorrow by Dr. Karilyn Cota.  I discussed the      procedure including risks, benefits which include, but are not limited      to, bleeding, infection, perforation and drug reaction. Informed consent      will be obtained.  3.  Clear liquids tonight.  4.  n.p.o. past midnight.  5.  Will hold her aspirin for now.  6.  She is going to prep with GoLYTELY 4 L over four hours to begin 1700      today and she will have two Fleet's enemas per rectum at 8:00 in the      morning.  7.  Further recommendations to follow procedure.   I would like to thank Dr. Ouida Sills for allowing [place] to participate in the care  of Ms. Harvel.      Nicholas Lose,  N.P.      Lionel December, M.D.  Electronically Signed    KC/MEDQ  D:  01/22/2005  T:  01/22/2005  Job:  [id number]

## 2010-08-25 NOTE — Group Therapy Note (Signed)
   NAMEKHALILA, BUECHNER                       ACCOUNT NO.:  [id number]   MEDICAL RECORD NO.:  [id number]                   PATIENT TYPE:  INP   LOCATION:  A218                                 FACILITY:  [facility]   PHYSICIAN:  Edward L. Juanetta Gosling, M.D.             DATE OF BIRTH:  [birth year]   DATE OF PROCEDURE:  DATE OF DISCHARGE:                                   PROGRESS NOTE   PROBLEMS:  Diverticulitis.   SUBJECTIVE:  The patient says she is feeling well.  She is not having any  abdominal pain now.  She has no other new complaints except that she is  hungry.   PHYSICAL EXAMINATION:  CHEST:  Clear.  HEART:  Regular.  ABDOMEN:  Nontender now.  VITAL SIGNS:  She is afebrile.  EXTREMITIES:  No edema.  CNS:  Grossly intact.   ASSESSMENT:  She is doing well.   PLAN:  Advance her diet.  If she does well through the day on an advanced  diet, I will probably switch her to oral antibiotics later today.                                               Edward L. Juanetta Gosling, M.D.    ELH/MEDQ  D:  12/31/2001  T:  01/01/2002  Job:  [id number]

## 2010-08-25 NOTE — Discharge Summary (Signed)
NAMEJENNIGER, FIGIEL             ACCOUNT NO.:  [id number]   MEDICAL RECORD NO.:  [id number]          PATIENT TYPE:  INP   LOCATION:  2023                         FACILITY:  [facility]   PHYSICIAN:  Willa Rough, M.D.     DATE OF BIRTH:  April 20, 1917   DATE OF ADMISSION:  08/06/2004  DATE OF DISCHARGE:  08/07/2004                                 DISCHARGE SUMMARY   PROCEDURES:  1. Adenosine Myoview, Aug 07, 2004.  2. Lower extremity venous Dopplers.     REASON FOR ADMISSION:  Ms. Cortner is an 77 year old female, with known  coronary artery disease, followed by Dr. Charlton Haws, who presented from  [facility] for further evaluation and management of chest pain.  Please refer to dictated admission note for further details.   LABORATORY DATA:  Serial cardiac markers normal.  Lipid profile:  Total  cholesterol 165, triglycerides 177, HDL 47, LDL 83.  TSH 3.02.  Normal  electrolytes, renal function, and liver enzymes, marginally elevated glucose  of 102, CBC normal.   HOSPITAL COURSE:  The patient initially presented to [facility]  Emergency Room with a complaint of chest pain, had negative serial cardiac  markers, but with elevated D-dimer (1.13) and followup chest CT negative for  pulmonary embolus.  Of note, however, CT scan also notable for 3  questionable nodular foci in the right lung (nonspecific), with recommended  followup CT scan in 3 months.   Following transfer from [facility], repeat cardiac markers were all within  normal limits.   The patient was thus cleared to proceed with noninvasive workup and was  scheduled for an adenosine Myoview.  Perfusion imaging revealed no evidence  of scar/ischemia (study was not gated).   No further cardiac workup was recommended; patient was cleared for  discharge.   Of note, the patient also was referred for lower extremity venous Dopplers  which were negative for DVT or superficial thrombosis.   MEDICATION  ADJUSTMENTS:  1. Up-titration of Lisinopril on admission.  2. Imdur increased at time of discharge.     MEDICATIONS AT DISCHARGE:  1. Aspirin 81 mg daily.  2. Imdur 30 mg b.i.d.  3. Metoprolol 100 mg b.i.d.  4. Prilosec 20 mg daily.  5. Hydrochlorothiazide 25 mg daily.  6. Lisinopril 20 mg b.i.d.  7. Lipitor 20 mg daily.     INSTRUCTIONS:  Low-fat/-cholesterol diet.   FOLLOWUP:  The patient will follow up with Dr. Charlton Haws on Monday, Aug 21, 2004, at 11 a.m.   Patient also instructed to arrange followup with her primary care physician,  Dr. Carylon Perches, in [place].   DISCHARGE DIAGNOSES:  1. Nonischemic chest pain.      a. Normal serial cardiac markers.      b. Normal perfusion by adenosine Myoview, Aug 07, 2004.  2. Coronary artery disease.      a. History of myocardial infarction with known 100% right coronary         artery with distal collateralization by cardiac catheterization,         April 2002.  b. Normal left ventricular function.  3. Dyslipidemia.  4. Abnormal chest CT scan.      a. Nonspecific right lung nodules.  5. Hypertension.  6. Gastroesophageal reflux disease.  7. Obesity.        GS/MEDQ  D:  08/07/2004  T:  08/08/2004  Job:  [id number]   cc:   Kingsley Callander. Ouida Sills, MD  [address]  [place]  [place] [address]  Fax: [phone]

## 2010-08-25 NOTE — H&P (Signed)
NAMELATERRA, LUBINSKI NO.:  [id number]   MEDICAL RECORD NO.:  [id number]          PATIENT TYPE:  INP   LOCATION:  2023                         FACILITY:  [facility]   PHYSICIAN:  Verne Grain, MD   DATE OF BIRTH:  08-Jan-1918   DATE OF ADMISSION:  08/06/2004  DATE OF DISCHARGE:                                HISTORY & PHYSICAL   PHYSICIANS:  Primary cardiologist:  Charlton Haws, M.D.  Primary care physician: Kingsley Callander. Ouida Sills, MD   CHIEF COMPLAINT:  Chest pain, transferred from [facility].   HISTORY OF PRESENT ILLNESS:  An 77 year old female with hypertension,  hyperlipidemia, coronary artery disease status post myocardial infarction in  2000, history of negative stress test by Dr. Eden Emms in January 2004.  Most  recent cardiac catheterization May 2002 with totaled right coronary artery  and left-to-right collaterals, a long 50% LAD region that was slightly worse  from previous cardiac catheterization in 2001, treated medically.  Has done  well until this evening when was at a birthday party around 4 or 5 p.m. at  which time she felt weak and had a fall.  However, reports not hurting  herself at that time.  Unclear exactly why she felt weak.  She describes no  focal weakness now.  She denies any dizziness or other focal neurologic  complaint.  At 8 p.m. she reported having sudden onset chest discomfort,  squeezing, with shortness of breath, mild nausea, mild diaphoresis.  No  radiation.  She reports taking 2 sublingual nitroglycerin with only partial  relief.  She reported to [facility] Emergency Room for further evaluation.  She was treated with nitroglycerin, morphine, and oxygen as well as IV  nitroglycerin and eventually reported being pain free.  She recalls the  morphine being the most helpful agent to her recollection.  She still has  some mild residual soreness.  She notes marked discomfort with palpation  along her sternum.  She does have some increased chest  discomfort with deep  inspiration.  Her D-dimer is noted to be 1.1 at [facility].  Subsequently  she had a spiral CT ordered that showed no evidence of pulmonary embolism.   ALLERGIES:  No known drug allergies.   CURRENT MEDICATIONS:  1.  Multivitamins 1 tablet p.o. daily.  2.  Lipitor 20 mg p.o. q.h.s.  3.  Lisinopril 20 mg p.o. daily.  4.  Aspirin 81 mg p.o. daily.  5.  Isosorbide mononitrate 30 mg p.o. daily.  6.  Metoprolol 100 mg p.o. b.i.d.  7.  Prilosec 20 mg p.o. daily.  8.  Hydrochlorothiazide 25 mg p.o. daily.   PAST MEDICAL HISTORY:  1.  Coronary artery disease status post myocardial infarction in 2000,      history of negative stress test in 2004 by Dr. Eden Emms, status post      admission for chest pain and subsequent cardiac catheterization in April      2002 which revealed left main normal, LAD with long mid 50% that was      somewhat worrisome.  On previous cardiac catheterization in 2001, left  circumflex with some minor irregularities, right coronary artery totaled      with left-to-right collaterals, left ventricular ejection fraction 70%.  2.  Hypertension.  3.  Hyperlipidemia.  4.  History of gastric reflux symptoms.  5.  History of right total knee arthroplasty.  6.  History of laparoscopic cholecystectomy and bilateral cataracts.  7.  History of diverticulitis.   SOCIAL HISTORY:  The patient is a widow; she has four children.  She has no  tobacco, alcohol, or drug history.   FAMILY HISTORY:  The patient's mother died at age 25 of tuberculosis.  Her  father died at age 47 of a myocardial infarction.  She has multiple  siblings, some of which had heart problems and one of whom had leukemia.   REVIEW OF SYSTEMS:  The patient reports no fevers, chills, sweats, weight  change, or adenopathy.  She has no acute changes in auditory or visual  status.  She has no rash or other lesions to report.  She describes chest  pain, chest squeezing with shortness of  breath, and also reports an episode  of a fall but did not lose consciousness as described in HPI.  She had no  cough or wheeze.  She has no bowel or bladder complaints.  She felt  transiently weak during the fall but currently has no complaints of weakness  or numbness.  She did report mild nausea and diaphoresis with her chest  discomfort; otherwise, she has no bowel or bladder complaints.  She reports  no polyuria, polydipsia, heat or cold intolerance, skin or hair changes  suggestive of endocrinologic abnormality.  All other systems are negative.   PHYSICAL EXAMINATION:  VITAL SIGNS:  Temperature 98.6, heart rate 66,  respiratory rate 16, blood pressure 155/81, oxygen saturation 98% on room  air.  GENERAL:  The patient is pleasant, alert, cooperative.  HEENT:  She is normocephalic and atraumatic.  Extraocular eye movements are  intact.  Oropharynx pink and moist.  NECK:  Supple.  There is no evidence of bruit.  There is no evidence of  jugular venous distention.  There is no evidence of lymphadenopathy.  CARDIOVASCULAR:  Regular S1 and regular S2.  LUNGS:  Lung fields reveal occasional scattered crackles at the base.  However, these appear to clear with cough.  There are no acute rashes  identified.  ABDOMEN:  Soft, nontender, nondistended with positive bowel sounds.  EXTREMITIES:  Lower extremity examination reveals no evidence of edema.  Extremities are well perfused.  NEUROLOGIC:  Grossly nonfocal.  The patient is alert and answers questions  appropriately.  She is able to move all four extremities without difficulty.   LABORATORY DATA AND OTHER STUDIES:  Spiral CT:  No PE, per verbal report  from [facility].   Chest x-ray:  Cardiomegaly with subsegmental atelectasis at the left base,  at [facility].   EKG:  Sinus rhythm at a rate of 75 with evidence of left axis deviation, normal PR, QRS, and QTC intervals.  There are 2 PVCs noted.  There is poor R  wave progression  There  is evidence of LVH.  There are no significant  changes when compared to previous EKG.   Laboratory values:  White blood cell count 7.2, hematocrit 34, platelet  count 251.  Sodium 141, potassium 3.5, chloride 105, bicarb 30, BUN 27,  creatinine 1.4, glucose 135.  Anion gap 6.  CK-MB 1.1 x 3.  Troponin I less  than 0.05 x 3. BNP  84.  D-dimer 1.13.  Calcium 9.0.  Differential with  segmented neutrophils at 55%.   ASSESSMENT AND PLAN:  An 77 year old female with hypertension,  hyperlipidemia, gastric reflux disease, coronary artery disease status post  myocardial infarction in 2000 with last catheterization in 2002 with chronic  totaled RCA and left-to-right collaterals with 50% mid LAD.  Negative stress  in 2004, with chest discomfort and shortness of breath, sudden onset at rest  after a fall earlier in the evening. The patient had a mildly elevated D-  dimer, but her spiral CT at [facility] was negative for evidence of  pulmonary embolism.  The patient was sent to [facility] for further  evaluation.  It is notable on physical exam the patient has significant pain  with palpation of her anterior precordium and sometimes with deep  inspiration.  The patient's EKG reveals no acute changes when compared to  previous.  Cardiac markers have been negative.  Potassium is 3.5.  1.  Shortness of breath and chest discomfort of unclear etiology (spiral CT      negative).  The patient's history is filled with some typical and some      atypical features.  Reproducibility of the patient's chest discomfort      with palpation is suspicious for musculoskeletal origin (i.e.,      costochondritis).  If cardiac evaluation is negative, could consider      empiric Ancef therapy for costochondritis.  In light of the fact that      the patient does have some symptoms that are suggestive of pulmonary      embolism, will also order bilateral lower extremity ultrasound to      exclude evidence of a deep vein  thrombosis.  This would further support      the absence of pulmonary embolus as thought to be the case with negative      spiral CT performed at [facility].  I will continue aspirin, heparin,      metoprolol, Lisinopril, Lipitor, and nitrates with additional CK and      troponin on telemetry and followup EKGs for complete rule out of      myocardial infarction.  Will keep patient n.p.o. except for medications      with plans for adenosine Cardiolite to exclude ischemia.  Will      supplement potassium with 20 mEq p.o. x 1.  Will check a thyroid profile      along with urinalysis and culture to look for any other precipitating      causes of today's events.  2.  Hypertension.  Continue metoprolol, Lisinopril, Hydrochlorothiazide to      optimize blood pressure control.  Increase Lisinopril q.12h. to help      reach goal blood pressure of less than 135/85 while following creatinine     and potassium to look for any marked abnormalities wit this dosage      change in Lisinopril.  3.  Hyperlipidemia.  Will continue Lipitor as previously prescribed and      check LFTs and lipid profile along with a total CK to ensure that this      medication is being well tolerated and lipid profile is at goal (LDL      less than 70, triglycerides less than 150).  4.  History of gastroesophageal reflux disease.  Continue Prilosec 20 mg      p.o. daily as previously prescribed.      DDH/MEDQ  D:  08/06/2004  T:  08/06/2004  Job:  [id number]   cc:   Charlton Haws, M.D.   Kingsley Callander. Ouida Sills, MD  [address]  [place]  [place] [address]  Fax: [phone]

## 2010-08-25 NOTE — Op Note (Signed)
[name], [name]             ACCOUNT NO.:  [id number]   MEDICAL RECORD NO.:  [id number]          PATIENT TYPE:  INP   LOCATION:  A206                          FACILITY:  [facility]   PHYSICIAN:  Lionel December, M.D.    DATE OF BIRTH:  October 15, 1917   DATE OF PROCEDURE:  01/23/2005  DATE OF DISCHARGE:                                 OPERATIVE REPORT   PROCEDURE:  Colonoscopy limited splenic flexure.   INDICATIONS:  [name] is an 77 year old Caucasian female who presents with  sudden onset of lower abdominal cramps with GI bleed. Her hemoglobin has  dropped slightly. Based on her history, she is suspected to have ischemic  colitis. She has known extensive diverticular disease. Procedure risks were  reviewed with the patient, and informed consent was obtained.   PREMEDICATION:  Demerol 25 mg IV, Versed 2 mg IV in divided dose.   FINDINGS:  Procedure performed in endoscopy suite. The patient's vital signs  and O2 saturation were monitored during the procedure and remained stable.  The patient was placed in left lateral position. Rectal examination  performed. No abnormality noted on external or digital exam. Olympus  videoscope was placed in rectum and advanced under vision into sigmoid colon  and beyond. Preparation was excellent. She had extensive diverticula in the  sigmoid colon, a very tortuous segment. Slowly and carefully, scope was  advanced to descending colon where she had patches of erythematous, friable  mucosa and multiple superficial ulcers. Biopsy was taken for routine  histology. These changes were felt to be typical of ischemic colitis. Scope  was passed into splenic flexure but not beyond. Endoscope was withdrawn, and  there were no other lesions noted. Rectal mucosa was normal. Scope was  retroflexed to examine anorectal junction which was unremarkable. Endoscope  was straightened and withdrawn. The patient tolerated the procedure well.   FINAL DIAGNOSIS:  1.   Ischemic colitis involving descending colon. I suspect this is end-organ      disease. No history of arrhythmia, therefore doubt thromboembolic      phenomenon.  2.  Extensive diverticula involving the sigmoid colon with few more at      descending colon.   RECOMMENDATIONS:  Will advance diet to 4 grams sodium diet. Diet could be  changed to high fiber diet plus fiber supplement next week when she is  completely recovered from this acute illness.      Lionel December, M.D.  Electronically Signed     NR/MEDQ  D:  01/23/2005  T:  01/23/2005  Job:  [id number]

## 2010-08-25 NOTE — Discharge Summary (Signed)
[name], [name]             ACCOUNT NO.:  [id number]   MEDICAL RECORD NO.:  [id number]          PATIENT TYPE:  INP   LOCATION:  A206                          FACILITY:  [facility]   PHYSICIAN:  Kingsley Callander. Ouida Sills, MD       DATE OF BIRTH:  05/25/1917   DATE OF ADMISSION:  01/21/2005  DATE OF DISCHARGE:  10/18/2006LH                                 DISCHARGE SUMMARY   DISCHARGE DIAGNOSES:  1.  Ischemic colitis.  2.  Near syncope.  3.  Coronary artery disease.  4.  Hypokalemia.  5.  Internal carotid artery aneurysm.  6.  Hypertension.  7.  Gastroesophageal reflux disease.   HOSPITAL COURSE:  This patient is an 77 year old female who presented to the  emergency room with nausea, vomiting, crampy abdominal pain.  She had had a  near syncopal episode at home.  She was hemodynamically stable in the  emergency room.  She was hospitalized for observation.  She subsequently had  four bloody stools.  Her hemoglobin dropped to 10.4.  A gastroenterology  consultation was obtained with Dr. Karilyn Cota.  She was felt to likely have  ischemic colitis.  She underwent a colonoscopy which confirmed this with  findings consistent with ischemic colitis including patchy erythema and  multiple superficial ulcerated areas.   Following her colonoscopy her diet was advanced without complication.  She  had no recurrent nausea, vomiting, or bleeding.  She was stable from a heart  disease standpoint.   Because of near syncope she had undergone a CT scan of the head in the  emergency room.  This revealed a 1.5 cm suprasellar mass which was worrisome  for an internal carotid artery terminus aneurysm.  MRA and MRI was  recommended.  This will be obtained as an outpatient.   She was improved and stable for discharge on October 18.  She will follow up  in the office in one to two weeks.  Aspirin will be held for one week.   DISCHARGE MEDICATIONS:  1.  Metoprolol 100 mg b.i.d.  2.  Altace 10 mg daily.  3.  Imdur 30  mg b.i.d.  4.  Prilosec 20 mg daily.  5.  Lipitor 110 mg daily.  6.  HCTZ 25 mg daily.  7.  Centrum Silver daily.  8.  Vitamin B12 500 mcg daily.      Kingsley Callander. Ouida Sills, MD  Electronically Signed     ROF/MEDQ  D:  01/24/2005  T:  01/24/2005  Job:  [id number]   cc:   Lionel December, M.D.  [address]  [place]  [place] [address]

## 2010-09-29 ENCOUNTER — Encounter (INDEPENDENT_AMBULATORY_CARE_PROVIDER_SITE_OTHER): Payer: Self-pay | Admitting: Internal Medicine

## 2010-09-29 ENCOUNTER — Ambulatory Visit (HOSPITAL_COMMUNITY): Admission: RE | Admit: 2010-09-29 | Payer: Medicare Other | Source: Ambulatory Visit | Admitting: Internal Medicine

## 2011-01-04 LAB — CBC
Platelets: 267
RDW: 12.9

## 2011-01-04 LAB — DIFFERENTIAL
Basophils Absolute: 0
Eosinophils Relative: 2
Lymphocytes Relative: 12
Neutro Abs: 7.5

## 2011-01-04 LAB — BASIC METABOLIC PANEL
BUN: 20
Calcium: 9.2
GFR calc non Af Amer: 36 — ABNORMAL LOW
Glucose, Bld: 116 — ABNORMAL HIGH

## 2011-01-04 LAB — SEDIMENTATION RATE: Sed Rate: 28 — ABNORMAL HIGH

## 2011-01-16 LAB — CBC
HCT: 36.9
Hemoglobin: 12.5
MCHC: 33.9
MCV: 89.6
RDW: 13.8

## 2011-01-16 LAB — URINALYSIS, ROUTINE W REFLEX MICROSCOPIC
Bilirubin Urine: NEGATIVE
Glucose, UA: NEGATIVE
Ketones, ur: NEGATIVE
Protein, ur: NEGATIVE

## 2011-01-16 LAB — BASIC METABOLIC PANEL
CO2: 28
Calcium: 9.1
Chloride: 106
Glucose, Bld: 111 — ABNORMAL HIGH
Potassium: 3.9
Sodium: 141

## 2011-02-22 ENCOUNTER — Other Ambulatory Visit (HOSPITAL_COMMUNITY): Payer: Self-pay | Admitting: Internal Medicine

## 2011-02-22 DIAGNOSIS — Z139 Encounter for screening, unspecified: Secondary | ICD-10-CM

## 2011-03-06 ENCOUNTER — Ambulatory Visit (HOSPITAL_COMMUNITY)
Admission: RE | Admit: 2011-03-06 | Discharge: 2011-03-06 | Disposition: A | Discharge status: Home / Self Care | Payer: Medicare Other | Source: Ambulatory Visit | Attending: Internal Medicine | Admitting: Internal Medicine

## 2011-03-06 DIAGNOSIS — Z1231 Encounter for screening mammogram for malignant neoplasm of breast: Secondary | ICD-10-CM | POA: Insufficient documentation

## 2011-03-06 DIAGNOSIS — Z139 Encounter for screening, unspecified: Secondary | ICD-10-CM

## 2012-06-21 ENCOUNTER — Inpatient Hospital Stay (HOSPITAL_COMMUNITY)
Admission: EM | Admit: 2012-06-21 | Discharge: 2012-06-27 | DRG: 371 | Disposition: A | Discharge status: Home Health | Payer: Medicare Other | Attending: Internal Medicine | Admitting: Internal Medicine

## 2012-06-21 ENCOUNTER — Emergency Department (HOSPITAL_COMMUNITY): Payer: Medicare Other

## 2012-06-21 ENCOUNTER — Encounter (HOSPITAL_COMMUNITY): Payer: Self-pay | Admitting: *Deleted

## 2012-06-21 DIAGNOSIS — I441 Atrioventricular block, second degree: Secondary | ICD-10-CM | POA: Diagnosis present

## 2012-06-21 DIAGNOSIS — N179 Acute kidney failure, unspecified: Secondary | ICD-10-CM | POA: Diagnosis not present

## 2012-06-21 DIAGNOSIS — I4891 Unspecified atrial fibrillation: Secondary | ICD-10-CM

## 2012-06-21 DIAGNOSIS — K449 Diaphragmatic hernia without obstruction or gangrene: Secondary | ICD-10-CM | POA: Diagnosis present

## 2012-06-21 DIAGNOSIS — I495 Sick sinus syndrome: Secondary | ICD-10-CM | POA: Diagnosis present

## 2012-06-21 DIAGNOSIS — B3781 Candidal esophagitis: Secondary | ICD-10-CM | POA: Diagnosis present

## 2012-06-21 DIAGNOSIS — Z96659 Presence of unspecified artificial knee joint: Secondary | ICD-10-CM

## 2012-06-21 DIAGNOSIS — K209 Esophagitis, unspecified without bleeding: Secondary | ICD-10-CM | POA: Diagnosis present

## 2012-06-21 DIAGNOSIS — E7800 Pure hypercholesterolemia, unspecified: Secondary | ICD-10-CM | POA: Diagnosis present

## 2012-06-21 DIAGNOSIS — E876 Hypokalemia: Secondary | ICD-10-CM | POA: Diagnosis present

## 2012-06-21 DIAGNOSIS — Z79899 Other long term (current) drug therapy: Secondary | ICD-10-CM

## 2012-06-21 DIAGNOSIS — Z66 Do not resuscitate: Secondary | ICD-10-CM | POA: Diagnosis present

## 2012-06-21 DIAGNOSIS — D62 Acute posthemorrhagic anemia: Secondary | ICD-10-CM | POA: Diagnosis not present

## 2012-06-21 DIAGNOSIS — E860 Dehydration: Secondary | ICD-10-CM | POA: Diagnosis present

## 2012-06-21 DIAGNOSIS — R55 Syncope and collapse: Secondary | ICD-10-CM

## 2012-06-21 DIAGNOSIS — K228 Other specified diseases of esophagus: Secondary | ICD-10-CM | POA: Diagnosis present

## 2012-06-21 DIAGNOSIS — R001 Bradycardia, unspecified: Secondary | ICD-10-CM | POA: Diagnosis present

## 2012-06-21 DIAGNOSIS — I509 Heart failure, unspecified: Secondary | ICD-10-CM | POA: Diagnosis present

## 2012-06-21 DIAGNOSIS — Z8673 Personal history of transient ischemic attack (TIA), and cerebral infarction without residual deficits: Secondary | ICD-10-CM

## 2012-06-21 DIAGNOSIS — K219 Gastro-esophageal reflux disease without esophagitis: Secondary | ICD-10-CM | POA: Diagnosis present

## 2012-06-21 DIAGNOSIS — K2289 Other specified disease of esophagus: Secondary | ICD-10-CM | POA: Diagnosis present

## 2012-06-21 DIAGNOSIS — A0472 Enterocolitis due to Clostridium difficile, not specified as recurrent: Principal | ICD-10-CM | POA: Diagnosis present

## 2012-06-21 DIAGNOSIS — E785 Hyperlipidemia, unspecified: Secondary | ICD-10-CM | POA: Diagnosis present

## 2012-06-21 DIAGNOSIS — D509 Iron deficiency anemia, unspecified: Secondary | ICD-10-CM | POA: Diagnosis present

## 2012-06-21 DIAGNOSIS — K529 Noninfective gastroenteritis and colitis, unspecified: Secondary | ICD-10-CM | POA: Diagnosis present

## 2012-06-21 DIAGNOSIS — N390 Urinary tract infection, site not specified: Secondary | ICD-10-CM | POA: Diagnosis not present

## 2012-06-21 DIAGNOSIS — I2510 Atherosclerotic heart disease of native coronary artery without angina pectoris: Secondary | ICD-10-CM | POA: Diagnosis present

## 2012-06-21 DIAGNOSIS — I10 Essential (primary) hypertension: Secondary | ICD-10-CM | POA: Diagnosis present

## 2012-06-21 DIAGNOSIS — Z7982 Long term (current) use of aspirin: Secondary | ICD-10-CM

## 2012-06-21 HISTORY — DX: Nausea with vomiting, unspecified: R11.2

## 2012-06-21 HISTORY — DX: Essential (primary) hypertension: I10

## 2012-06-21 HISTORY — DX: Cerebral infarction, unspecified: I63.9

## 2012-06-21 HISTORY — DX: Nausea with vomiting, unspecified: Z98.890

## 2012-06-21 LAB — CBC WITH DIFFERENTIAL/PLATELET
Basophils Relative: 0 % (ref 0–1)
Eosinophils Absolute: 0 10*3/uL (ref 0.0–0.7)
Eosinophils Relative: 1 % (ref 0–5)
MCH: 24.3 pg — ABNORMAL LOW (ref 26.0–34.0)
MCHC: 31.4 g/dL (ref 30.0–36.0)
MCV: 77.4 fL — ABNORMAL LOW (ref 78.0–100.0)
Neutrophils Relative %: 68 % (ref 43–77)
Platelets: 212 10*3/uL (ref 150–400)
RDW: 16.4 % — ABNORMAL HIGH (ref 11.5–15.5)

## 2012-06-21 LAB — COMPREHENSIVE METABOLIC PANEL
ALT: 24 U/L (ref 0–35)
Albumin: 3.2 g/dL — ABNORMAL LOW (ref 3.5–5.2)
Alkaline Phosphatase: 110 U/L (ref 39–117)
Calcium: 8.5 mg/dL (ref 8.4–10.5)
Potassium: 4.1 mEq/L (ref 3.5–5.1)
Sodium: 138 mEq/L (ref 135–145)
Total Protein: 6.9 g/dL (ref 6.0–8.3)

## 2012-06-21 LAB — URINALYSIS, ROUTINE W REFLEX MICROSCOPIC
Bilirubin Urine: NEGATIVE
Nitrite: NEGATIVE
Specific Gravity, Urine: 1.02 (ref 1.005–1.030)
Urobilinogen, UA: 0.2 mg/dL (ref 0.0–1.0)
pH: 6 (ref 5.0–8.0)

## 2012-06-21 LAB — TROPONIN I: Troponin I: <0.3 ng/mL (ref <0.30)

## 2012-06-21 LAB — CLOSTRIDIUM DIFFICILE BY PCR: Toxigenic C. Difficile by PCR: POSITIVE — AB

## 2012-06-21 LAB — OCCULT BLOOD X 1 CARD TO LAB, STOOL: Fecal Occult Bld: NEGATIVE

## 2012-06-21 LAB — URINE MICROSCOPIC-ADD ON

## 2012-06-21 MED ORDER — ONDANSETRON HCL 4 MG/2ML IJ SOLN
4.0000 mg | Freq: Four times a day (QID) | INTRAMUSCULAR | Status: DC | PRN
  Administered 2012-06-23: 4 mg via INTRAVENOUS
  Filled 2012-06-21: qty 2

## 2012-06-21 MED ORDER — SODIUM CHLORIDE 0.9 % IV BOLUS (SEPSIS)
500.0000 mL | Freq: Once | INTRAVENOUS | Status: AC
  Administered 2012-06-21: 500 mL via INTRAVENOUS

## 2012-06-21 MED ORDER — METRONIDAZOLE 500 MG PO TABS
500.0000 mg | ORAL_TABLET | Freq: Three times a day (TID) | ORAL | Status: DC
  Administered 2012-06-21 – 2012-06-27 (×17): 500 mg via ORAL
  Filled 2012-06-21 (×17): qty 1

## 2012-06-21 MED ORDER — ACETAMINOPHEN 325 MG PO TABS
650.0000 mg | ORAL_TABLET | Freq: Four times a day (QID) | ORAL | Status: DC | PRN
  Administered 2012-06-25: 650 mg via ORAL
  Filled 2012-06-21: qty 2

## 2012-06-21 MED ORDER — SIMVASTATIN 10 MG PO TABS
10.0000 mg | ORAL_TABLET | Freq: Every day | ORAL | Status: DC
  Administered 2012-06-21 – 2012-06-26 (×6): 10 mg via ORAL
  Filled 2012-06-21 (×6): qty 1

## 2012-06-21 MED ORDER — ENOXAPARIN SODIUM 40 MG/0.4ML ~~LOC~~ SOLN
40.0000 mg | SUBCUTANEOUS | Status: DC
  Administered 2012-06-21 – 2012-06-23 (×3): 40 mg via SUBCUTANEOUS
  Filled 2012-06-21 (×3): qty 0.4

## 2012-06-21 MED ORDER — LOPERAMIDE HCL 2 MG PO CAPS
2.0000 mg | ORAL_CAPSULE | Freq: Once | ORAL | Status: AC
  Administered 2012-06-21: 2 mg via ORAL
  Filled 2012-06-21: qty 1

## 2012-06-21 MED ORDER — METOPROLOL TARTRATE 50 MG PO TABS
100.0000 mg | ORAL_TABLET | Freq: Two times a day (BID) | ORAL | Status: DC
  Administered 2012-06-21 – 2012-06-23 (×4): 100 mg via ORAL
  Filled 2012-06-21 (×4): qty 2

## 2012-06-21 MED ORDER — ONDANSETRON HCL 4 MG PO TABS
4.0000 mg | ORAL_TABLET | Freq: Four times a day (QID) | ORAL | Status: DC | PRN
  Administered 2012-06-23: 4 mg via ORAL
  Filled 2012-06-21: qty 1

## 2012-06-21 MED ORDER — ISOSORBIDE MONONITRATE ER 60 MG PO TB24
30.0000 mg | ORAL_TABLET | Freq: Two times a day (BID) | ORAL | Status: DC
  Administered 2012-06-21 – 2012-06-23 (×4): 30 mg via ORAL
  Filled 2012-06-21 (×4): qty 1

## 2012-06-21 MED ORDER — ALUM & MAG HYDROXIDE-SIMETH 200-200-20 MG/5ML PO SUSP
30.0000 mL | Freq: Four times a day (QID) | ORAL | Status: DC | PRN
  Administered 2012-06-25: 30 mL via ORAL
  Filled 2012-06-21: qty 30

## 2012-06-21 MED ORDER — LOSARTAN POTASSIUM 50 MG PO TABS
100.0000 mg | ORAL_TABLET | Freq: Every day | ORAL | Status: DC
  Administered 2012-06-22 – 2012-06-27 (×6): 100 mg via ORAL
  Filled 2012-06-21 (×6): qty 2

## 2012-06-21 MED ORDER — PANTOPRAZOLE SODIUM 40 MG PO TBEC
40.0000 mg | DELAYED_RELEASE_TABLET | Freq: Every day | ORAL | Status: DC
  Administered 2012-06-22 – 2012-06-24 (×3): 40 mg via ORAL
  Filled 2012-06-21 (×3): qty 1

## 2012-06-21 MED ORDER — ACETAMINOPHEN 650 MG RE SUPP: 650.0000 mg | Freq: Four times a day (QID) | RECTAL | Status: DC | PRN

## 2012-06-21 MED ORDER — DILTIAZEM HCL ER COATED BEADS 240 MG PO CP24
240.0000 mg | ORAL_CAPSULE | Freq: Every day | ORAL | Status: DC
  Administered 2012-06-22 – 2012-06-23 (×2): 240 mg via ORAL
  Filled 2012-06-21 (×4): qty 1

## 2012-06-21 MED ORDER — ASPIRIN EC 81 MG PO TBEC
81.0000 mg | DELAYED_RELEASE_TABLET | Freq: Every day | ORAL | Status: DC
  Administered 2012-06-22 – 2012-06-23 (×2): 81 mg via ORAL
  Filled 2012-06-21 (×2): qty 1

## 2012-06-21 MED ORDER — CLONIDINE HCL 0.1 MG PO TABS
0.1000 mg | ORAL_TABLET | Freq: Two times a day (BID) | ORAL | Status: DC
  Administered 2012-06-21 – 2012-06-27 (×12): 0.1 mg via ORAL
  Filled 2012-06-21 (×12): qty 1

## 2012-06-21 MED ORDER — SODIUM CHLORIDE 0.9 % IV SOLN
INTRAVENOUS | Status: DC
  Administered 2012-06-21: 1000 mL via INTRAVENOUS
  Administered 2012-06-22: 03:00:00 via INTRAVENOUS

## 2012-06-21 NOTE — ED Provider Notes (Signed)
History     This chart was scribed for Geoffery Lyons, MD, MD by Smitty Pluck, ED Scribe. The patient was seen in room APA14/APA14 and the patient's care was started at 10:39 AM.   CSN: [id number]  Arrival date & time 06/21/12  1017      Chief Complaint  Patient presents with  . syncopal episode     The history is provided by the EMS personnel and a relative. No language interpreter was used.  Pt is level 5 caveat due dementia.  [name] is a 79 y.o. female with hx of aneurysm, stroke, HTN and CHF  who presents to the Emergency Department BIB EMS due to syncopal episode today. Family reports that they witnessed the syncopal epiosde while eating. They states the LOC lasted 5 minutes then she regained consciousness. They states she had another syncopal episode 3 minutes later. Family reports that pt has had diarrhea and vomiting onset 2 days ago. Daughter is unsure of the number of episodes of emesis. Daughter states pt had bowel and bladder incontinence today but she usually has incontinence. Pt denies fever, chills, weakness, cough, SOB and any other pain.   Past Medical History  Diagnosis Date  . Hypertension   . Stroke   . CHF (congestive heart failure)   . GERD (gastroesophageal reflux disease)   . High cholesterol     Past Surgical History  Procedure Laterality Date  . Knee surgery      No family history on file.  History  Substance Use Topics  . Smoking status: Never Smoker   . Smokeless tobacco: Not on file  . Alcohol Use: No    OB History   Grav Para Term Preterm Abortions TAB SAB Ect Mult Living                  Review of Systems  Unable to perform ROS: Dementia    Allergies  Review of patient's allergies indicates no known allergies.  Home Medications  No current outpatient prescriptions on file.  BP 117/97  Pulse 73  Temp(Src) 98.4 F (36.9 C) (Oral)  Resp 18  Ht 5\' 9"  (1.753 m)  Wt 180 lb (81.647 kg)  BMI 26.57 kg/m2  SpO2  97%  Physical Exam  Nursing note and vitals reviewed. Constitutional: She appears well-developed and well-nourished.  She is an elderly pale appearing female. She is not in distress.   HENT:  Head: Normocephalic and atraumatic.  Mouth/Throat: Oropharynx is clear and moist.  Eyes: EOM are normal. Pupils are equal, round, and reactive to light.  Cardiovascular: Normal rate, regular rhythm and normal heart sounds.   Pulmonary/Chest: Effort normal and breath sounds normal. No respiratory distress. She has no wheezes. She has no rales.  Abdominal: Soft. Bowel sounds are normal. She exhibits no distension. There is no tenderness. There is no rebound.  Musculoskeletal: She exhibits no edema.  Neurological: She is alert.  Skin:  Appears somewhat pale.   Psychiatric: She has a normal mood and affect. Her behavior is normal.    ED Course  Procedures (including critical care time) DIAGNOSTIC STUDIES: Oxygen Saturation is 97% on [place], normal by my interpretation.    COORDINATION OF CARE: 10:44 AM Discussed ED treatment with pt and pt agrees.     Labs Reviewed  CBC WITH DIFFERENTIAL - Abnormal; Notable for the following:    Hemoglobin 10.2 (*)    HCT 32.5 (*)    MCV 77.4 (*)    MCH 24.3 (*)  RDW 16.4 (*)    Lymphocytes Relative 10 (*)    Lymphs Abs 0.6 (*)    Monocytes Relative 22 (*)    Monocytes Absolute 1.2 (*)    All other components within normal limits  COMPREHENSIVE METABOLIC PANEL - Abnormal; Notable for the following:    BUN 25 (*)    Creatinine, Ser 1.13 (*)    Albumin 3.2 (*)    GFR calc non Af Amer 41 (*)    GFR calc Af Amer 47 (*)    All other components within normal limits  URINALYSIS, ROUTINE W REFLEX MICROSCOPIC - Abnormal; Notable for the following:    Hgb urine dipstick TRACE (*)    Protein, ur 30 (*)    All other components within normal limits  TROPONIN I  URINE MICROSCOPIC-ADD ON   Dg Chest Port 1 View  06/21/2012  *RADIOLOGY REPORT*  Clinical Data:  Syncope.  PORTABLE CHEST - 1 VIEW  Comparison: 11/17/2009 prior chest radiographs dating back to 11/2003.  Findings: Cardiomegaly again noted. Mild elevation of the left hemidiaphragm with minimal basilar scarring again noted. There is no evidence of focal airspace disease, pulmonary edema, suspicious pulmonary nodule/mass, pleural effusion, or pneumothorax. No acute bony abnormalities are identified.  IMPRESSION: Cardiomegaly without evidence of acute cardiopulmonary disease.   Original Report Authenticated By: Harmon Pier, M.D.      No diagnosis found.   Date: 06/21/2012  Rate: 75  Rhythm: normal sinus rhythm  QRS Axis: left  Intervals: normal  ST/T Wave abnormalities: normal  Conduction Disutrbances:none  Narrative Interpretation:   Old EKG Reviewed: unchanged    MDM  The patient presents here by ems after two unresponsive episodes at home.  She has had diarrhea the past two days and I suspect this may be the combination of mild dehydration and the stress from the illness.  I have seen no cardiac arrhythmia while in the ED and the ekg remains unchanged from priors.  The labs are okay but I believe the patient requires admission for hydration and workup of syncope.  I have spoken with Dr. Ouida Sills who agrees to admit.       I personally performed the services described in this documentation, which was scribed in my presence. The recorded information has been reviewed and is accurate.         Geoffery Lyons, MD 06/21/12 1155

## 2012-06-21 NOTE — ED Notes (Signed)
Syncopal episode today witnessed by family with hx of same.  Per EMS - pt was alert upon their arrival.  Initial EMS assessment, pt was unable to answer questions appropriately.  Pt answers questions appropriately at this time.  CBG 166 en route.  Pt incontinent of bowel and bladder during episode with hx of same.

## 2012-06-21 NOTE — Progress Notes (Signed)
Patient C. Diff positive.  Dr. Ouida Sills notified, orders for PO Flagyl given.  Still wants patient to receive 1 time dose immodium.  Advance diet to [facility] for in the morning, but with no dairy products.  Informed MD that patient refused flexiseal when first offered this eveing, but order for flexiseal given incase patients changes her mind through out night.  Patient was educated on placement of and use of flexiseal and explained importance of skin care, but refuses placement at this time.

## 2012-06-22 LAB — CBC
MCH: 24.4 pg — ABNORMAL LOW (ref 26.0–34.0)
MCV: 77.4 fL — ABNORMAL LOW (ref 78.0–100.0)
Platelets: 219 10*3/uL (ref 150–400)
RBC: 3.9 MIL/uL (ref 3.87–5.11)

## 2012-06-22 LAB — FERRITIN: Ferritin: 9 ng/mL — ABNORMAL LOW (ref 10–291)

## 2012-06-22 LAB — BASIC METABOLIC PANEL
CO2: 23 mEq/L (ref 19–32)
Calcium: 8.2 mg/dL — ABNORMAL LOW (ref 8.4–10.5)
Glucose, Bld: 94 mg/dL (ref 70–99)
Sodium: 139 mEq/L (ref 135–145)

## 2012-06-22 LAB — IRON AND TIBC
Saturation Ratios: 10 % — ABNORMAL LOW (ref 20–55)
TIBC: 371 ug/dL (ref 250–470)

## 2012-06-22 LAB — RETICULOCYTES: Retic Ct Pct: 1 % (ref 0.4–3.1)

## 2012-06-22 NOTE — Progress Notes (Signed)
06/22/12 0047 Discussed use of flexi-seal with patient this evening due to frequent loose stools and possibility of skin irritation. Pt did not want flexiseal, stated would consider and let nursing know if she is agreeable to try flexi-seal. Has had two loose stools tonight, will monitor. Earnstine Regal, RN

## 2012-06-22 NOTE — Progress Notes (Signed)
NAMEKENDALL, [name]             ACCOUNT NO.:  [id number]  MEDICAL RECORD NO.:  [id number]  LOCATION:  A326                          FACILITY:  [facility]  PHYSICIAN:  Kingsley Callander. Ouida Sills, MD       DATE OF BIRTH:  12/02/1917  DATE OF PROCEDURE: DATE OF DISCHARGE:                                PROGRESS NOTE   [name] is having recurrent watery diarrhea.  She has also been incontinent of urine.  She is not experiencing any vomiting.  Her appetite has returned.  She ate breakfast without any difficulty.  She has not had fever.  OBJECTIVE:  VITAL SIGNS:  Temperature 97.8, pulse 77, blood pressure 138/58. LUNGS:  Clear. HEART:  Regular with no murmurs. ABDOMEN:  Soft and nontender with no palpable organomegaly. NEURO:  At baseline.  IMPRESSION/PLAN: 1. Syncope.  No recurrence. 2. Clostridium difficile colitis.  Her Clostridium difficile PCR is     positive.  She was started on metronidazole last night.  Her white     count this morning is 3.7.  She has had a mild drop in hemoglobin     from 10.2-9.5 with hydration. 3. Dehydration resolved.  Discontinue IV fluids.  BUN and creatinine     are 18 and 1.03, potassium is 4.0. 4. Hypertension.  Continue current therapy.     Kingsley Callander. Ouida Sills, MD     ROF/MEDQ  D:  06/22/2012  T:  06/22/2012  Job:  [id number]

## 2012-06-22 NOTE — H&P (Signed)
[name], [name]             ACCOUNT NO.:  [id number]  MEDICAL RECORD NO.:  [id number]  LOCATION:  A326                          FACILITY:  [facility]  PHYSICIAN:  Kingsley Callander. Ouida Sills, MD       DATE OF BIRTH:  [birth year]  DATE OF ADMISSION:  06/21/2012 DATE OF DISCHARGE:  LH                             HISTORY & PHYSICAL   CHIEF COMPLAINT:  Passed out.  HISTORY OF PRESENT ILLNESS:  This patient is a 79 year old white female who presented to the emergency room by ambulance after having 2 episodes of loss of consciousness at home witnessed by her daughter.  The patient had been experiencing vomiting and diarrhea for the past 2 days.  She had not experienced hematemesis, melena, or rectal bleeding.  She was sitting in a chair when she lost consciousness for about 5 minutes initially this morning.  She aroused and then had another episode lasting approximately 2 minutes.  She denied any chest pain.  She has been on telemetry and has had no arrhythmias.  She was evaluated in the emergency room where she was at her baseline neurologically.  She denies any fever or chills.  She has had some incontinence of her bowel and her bladder.  PAST MEDICAL HISTORY: 1. Stroke. 2. Hypertension. 3. CAD. 4. CHF. 5. GERD. 6. Hyperlipidemia. 7. History of knee replacement. 8. Carotid aneurysms.  MEDICATIONS: 1. Aspirin 81 mg daily. 2. Clonidine 0.1 mg b.i.d. 3. Diltiazem 240 mg daily. 4. Fish oil 1 g daily. 5. Lasix 20 mg daily. 6. Imdur 30 mg b.i.d. 7. Losartan 100 mg daily. 8. Metoprolol 100 mg b.i.d. 9. Multivitamin daily. 10.Prilosec 20 mg daily. 11.Simvastatin 10 mg daily.  ALLERGIES:  None.  SOCIAL HISTORY:  She does not smoke, drink, or use recreational drugs.  FAMILY HISTORY:  Remarkable for diabetes in her children.  REVIEW OF SYSTEMS:  No recent head injury.  No fever, chills, difficulty breathing, chest pain, rectal bleeding, or hematuria.  PHYSICAL EXAMINATION:  VITAL SIGNS:   Temperature 97.8, pulse 67, respirations 16, blood pressure 117/97. GENERAL:  She is alert and in no distress. HEENT:  No scleral icterus.  Nose and oropharynx are unremarkable. NECK:  Supple with no JVD or thyromegaly. LUNGS:  Clear. HEART:  Regular with no murmurs. ABDOMEN:  Soft, nondistended, nontender with no palpable organomegaly. EXTREMITIES:  No cyanosis, clubbing, or edema. NEURO:  No focal weakness.  Speech is intact.  Gait was not tested. LYMPH NODES:  No cervical or supraclavicular enlargement. SKIN:  Warm and dry.  LABORATORY DATA:  White count 5.7, hemoglobin 10.2, platelets 212,000. Sodium 138, potassium 4.1, bicarb 23, BUN 25, creatinine 1.13, calcium 8.5.  Troponin-I less than 0.30.  Urinalysis reveals 30 mg/dL of protein.  Chest x-ray reveals no acute infiltrate.  EKG reveals normal sinus rhythm at 75 beats per minute with left ventricular hypertrophy and nonspecific ST changes.  IMPRESSION/PLAN: 1. Gastroenteritis.  She has not been able to eat or drink much over     the last 2 days and is likely dehydrated.  BUN and creatinine are     mildly elevated at 25 and 1.13.  She will be treated with IV  fluids.  She will be allowed to start clear liquids if tolerated.     Zofran if needed.  We will obtain stool studies. 2. Syncope potentially related to volume depletion and vasovagal     episode.  She has shown no sign of dysrhythmia.  Code status is     DNR. 3. Microcytic anemia.  Hemoglobin is low at 10.2 and MCV is low at 77.     We will obtain an anemia profile. 4. Hypertension controlled. 5. History of stroke.  Continue aspirin. 6. History of congestive heart failure, stable.  We will hold Lasix     temporarily. 7. History of carotid aneurysms.     Kingsley Callander. Ouida Sills, MD     ROF/MEDQ  D:  06/22/2012  T:  06/22/2012  Job:  [id number]

## 2012-06-23 ENCOUNTER — Encounter (HOSPITAL_COMMUNITY): Payer: Self-pay | Admitting: Adult Health

## 2012-06-23 DIAGNOSIS — I498 Other specified cardiac arrhythmias: Secondary | ICD-10-CM

## 2012-06-23 DIAGNOSIS — R001 Bradycardia, unspecified: Secondary | ICD-10-CM | POA: Diagnosis present

## 2012-06-23 DIAGNOSIS — R55 Syncope and collapse: Secondary | ICD-10-CM

## 2012-06-23 DIAGNOSIS — K219 Gastro-esophageal reflux disease without esophagitis: Secondary | ICD-10-CM | POA: Diagnosis present

## 2012-06-23 DIAGNOSIS — I10 Essential (primary) hypertension: Secondary | ICD-10-CM

## 2012-06-23 MED ORDER — AMLODIPINE BESYLATE 5 MG PO TABS
5.0000 mg | ORAL_TABLET | Freq: Every day | ORAL | Status: DC
  Administered 2012-06-24 – 2012-06-27 (×4): 5 mg via ORAL
  Filled 2012-06-23 (×4): qty 1

## 2012-06-23 MED ORDER — AMLODIPINE BESYLATE 5 MG PO TABS
10.0000 mg | ORAL_TABLET | Freq: Every day | ORAL | Status: DC
  Administered 2012-06-23: 10 mg via ORAL
  Filled 2012-06-23: qty 2

## 2012-06-23 NOTE — Progress Notes (Signed)
[name], [name]             ACCOUNT NO.:  [id number]  MEDICAL RECORD NO.:  [id number]  LOCATION:  A326                          FACILITY:  [facility]  PHYSICIAN:  Kingsley Callander. Ouida Sills, MD       DATE OF BIRTH:  1918/01/28  DATE OF PROCEDURE:  06/23/2012 DATE OF DISCHARGE:                                PROGRESS NOTE   SUBJECTIVE:  Ms. Ansley is still having watery diarrhea.  She has persistent weakness.  She is able to eat.  She has had no fever.  PHYSICAL EXAMINATION:  VITAL SIGNS:  Temperature 98, pulse 81, respirations 20, blood pressure 99/57. LUNGS:  Clear. HEART:  Regular with no murmurs. ABDOMEN:  Nontender with no organomegaly. EXTREMITIES:  No edema.  IMPRESSION/PLAN: 1. Clostridium difficile colitis.  Continue metronidazole. 2. Syncope, no recurrence. 3. Iron-deficiency anemia.  Latest hemoglobin is 9.5 from yesterday     with an MCV of 77.  Her ferritin is low at 9.  Iron is 38 with a     TIBC of 371.  B12 and folate are normal.  She is Hemoccult     negative.  Her stool culture remains pending.  We will continue general supportive care in the hospital.  Her family is really not comfortable with her going home at this point with her continuing to have watery diarrhea.  She is certainly at risk of fall with her continued generalized weakness.     Kingsley Callander. Ouida Sills, MD     ROF/MEDQ  D:  06/23/2012  T:  06/23/2012  Job:  [id number]

## 2012-06-23 NOTE — Progress Notes (Addendum)
Patient converted from normal sinus rhythm to atrial fibrillation.  MD on call notified.  No new orders received at this time.  Will continue to monitor patient.  Gibson, Dawn Stagers 06/23/2012

## 2012-06-23 NOTE — Progress Notes (Signed)
UR Chart Review Completed  

## 2012-06-23 NOTE — Care Management Note (Signed)
    Page 1 of 2   06/27/2012     10:59:06 AM   CARE MANAGEMENT NOTE 06/27/2012  Patient:  ADELIE, CROSWELL   Account Number:  [id number]  Date Initiated:  06/23/2012  Documentation initiated by:  Sharrie Rothman  Subjective/Objective Assessment:   Pt admitted from home with c diff and weakness. Pt currently lives alone but has a daughter who lives across the road from her. Pt has a walker for home use.     Action/Plan:   Pt is not interested in placement. Pt is ok with HH at discharge. CM spoke with pts daughter who is also agreeable to [facility] RN only at discharge. PT will assess pt inhouse.   Anticipated DC Date:  06/24/2012   Anticipated DC Plan:  HOME W HOME HEALTH SERVICES      DC Planning Services  CM consult      [facility] Choice  HOME HEALTH   Choice offered to / List presented to:  C-1 Patient        HH arranged  HH-1 RN      [facility] agency  [facility]   Status of service:  Completed, signed off Medicare Important Message given?  YES (If response is "NO", the following Medicare IM given date fields will be blank) Date Medicare IM given:  06/27/2012 Date Additional Medicare IM given:    Discharge Disposition:  HOME W HOME HEALTH SERVICES  Per UR Regulation:    If discussed at Long Length of Stay Meetings, dates discussed:   06/26/2012    Comments:  06/27/12 1100 [name], RN BSN CM Pt discharged home with [facility] RN. [name] of [facility] is aware and will collect the pts information from the chart. Pt has no DME needs. HH services to start within 48 hours. Pt and pts nurse aware of discharge arrangements.  06/23/12 1151 [name], RN BSN CM

## 2012-06-23 NOTE — Progress Notes (Signed)
Patient converted from normal sinus rhythm to second degree type II heart block.  MD notified.  12 lead EKG ordered and carried out.  Faxed results to MD.  MD called to order cardiology consult.  Will notify cardiology of new consult.  Schonewitz, [name] 06/23/2012

## 2012-06-23 NOTE — Progress Notes (Signed)
PT Cancellation Note  Patient Details Name: [name] MRN: [id number] DOB: 1917/12/24   Cancelled Treatment:    Reason Eval/Treat Not Completed: Medical issues which prohibited therapy (pt having uncontrolled diarrhea) We initiated PT eval with an interview, whereupon pt began to have uncontrolled diarrhea.  There did not appear to be an end in sight, and as activity usually increases bowel motility, we decided to defer until tomorrow.  [name] L 06/23/2012, 1:51 PM

## 2012-06-23 NOTE — Consult Note (Addendum)
CARDIOLOGY CONSULT NOTE  Patient ID: [name] MRN: [id number] DOB/AGE: 1918/02/11 79 y.o.  Admit date: 06/21/2012 Referring Physician:Fagan,Roy Primary PhysicianFAGAN,ROY, MD Primary Cardiologist: Eden Emms Reason for Consultation:Arrythmia Active Problems:   Syncope   Essential hypertension, benign   CVA (cerebral infarction)   GERD (gastroesophageal reflux disease)   Noninfectious gastroenteritis and colitis  HPI: [name] is a 79 year old female patient who has been lost to cardiac followup normally followed by Dr. Eden Emms was admitted with a syncopal episode. She been experiencing nausea and vomiting along with diarrhea for the last 48 hours prior to admission. She was found to have ventricular arrhythmias during evaluation on telemetry and we are asked for recommendations. She had 2 episodes of syncope prior to coming in to the hospital, lasting 5 minutes or less. She states that she had a dizzy spell a few days prior to admission when she turned around quickly on her way to the bathroom. And then again will seated eating lunch the day of admission.     Review of telemetry revealed around 12 30 this afternoon she had sudden episode of  second degree AV block Mobitz II, with heart rate into the 30s. She was symptomatic with some dizziness while eating lunch according to her son. The patient does not remember having any symptoms.    Other past medical history to include a CVA, hypertension, CAD with last cardiac catheterization in 2001 treated medically, echocardiogram in 2010 with normal LVEF. She has had previous admissions for near syncope in the setting of gastritis. Most recent documentation was in 2006. 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  Review of labs reveals a cardiac enzymes are negative x2,. Potassium 4.1 sodium 138 creatinine 1.13 BUN 25. She was found to have some mild anemia with a hemoglobin of  10.2 hematocrit 32.5. Anemia profile revealed iron deficiency with low saturation and low ferritin levels. EKG did not reveal acute ACS, with normal sinus rhythm and LVH noted. She was not found to be hypotensive.    Review of systems complete and found to be negative unless listed above   Past Medical History  Diagnosis Date  . Hypertension   . Stroke   . CHF (congestive heart failure)   . GERD (gastroesophageal reflux disease)   . High cholesterol     Family History  Problem Relation Age of Onset  . Cancer Mother   . Cancer Sister   . Heart attack Father     History   Social History  . Marital Status: Widowed    Spouse Name: N/A    Number of Children: N/A  . Years of Education: N/A   Occupational History  . Not on file.   Social History Main Topics  . Smoking status: Never Smoker   . Smokeless tobacco: Not on file  . Alcohol Use: No  . Drug Use: No  . Sexually Active: Not on file   Other Topics Concern  . Not on file   Social History Narrative  . No narrative on file    Past Surgical History  Procedure Laterality Date  . Knee surgery      Prescriptions prior to admission  Medication Sig Dispense Refill  . aspirin EC 81 MG tablet Take 81 mg by mouth daily.      . cloNIDine (CATAPRES) 0.1 MG tablet Take 0.1 mg by mouth 2 (two) times daily.      Marland Kitchen diltiazem (  DILACOR XR) 240 MG 24 hr capsule Take 240 mg by mouth daily.      . fish oil-omega-3 fatty acids 1000 MG capsule Take 1 g by mouth daily.      . furosemide (LASIX) 20 MG tablet Take 20 mg by mouth daily.      . isosorbide mononitrate (IMDUR) 60 MG 24 hr tablet Take 30 mg by mouth 2 (two) times daily.      Marland Kitchen losartan (COZAAR) 100 MG tablet Take 100 mg by mouth daily.      . metoprolol (LOPRESSOR) 100 MG tablet Take 100 mg by mouth 2 (two) times daily.      . Multiple Vitamin (MULTIVITAMIN WITH MINERALS) TABS Take 1 tablet by mouth daily.      Marland Kitchen omeprazole (PRILOSEC) 20 MG capsule Take 20 mg by mouth daily.       . simvastatin (ZOCOR) 10 MG tablet Take 10 mg by mouth at bedtime.       Cardiac Catheterization 06/1999([name]) FINDINGS:  1. LEFT MAIN: Normal.  2. LEFT ANTERIOR DESCENDING: Has 30% tubular lesion proximally. The distal  vessel had a 40% tubular lesion; these were unchanged from the study done  in 1999. The first diagonal branch was normal.  3. CIRCUMFLEX CORONARY ARTERY: A large vessel, normal. There were two obtuse  marginal branches which were entirely normal. There were some left-to-right  collaterals to the PDA.  4. RIGHT CORONARY ARTERY: Known to be previously occluded. It was 100%  occluded  proximally, with some bridging collaterals to the distal vessel.  RAO VENTRICULOGRAPHY: Revealed inferior apical wall hypokinesis. Ejection  fraction is preserved with a 70% range.  HEMODYNAMICS:  1. Aortic pressure: 168/82.  2. Left ventricular pressure: 168/18.  The patient was somewhat more hypertensive than this during the case, and was  given  nitroglycerin sublingually. The aortic root injections done due to the  patients  pain between the scapula. There was no evidence of aortic dissection.  AORTOGRAM: Since she had significant hypertension, we did a distal aortogram  looking at her renal arteries. These were widely patent.  Echocardiogram: 2010 Left ventricle: The cavity size was normal. Wall thickness was increased in a pattern of mild LVH. Systolic function was normal. The estimated ejection fraction was in the range of 55% to 60%. Wall motion was normal; there were no regional wall motion abnormalities. Doppler parameters are consistent with abnormal left ventricular relaxation (grade 1 diastolic dysfunction). - Mitral valve: Calcified annulus. Mild regurgitation. - Left atrium: The atrium was moderately dilated. - Right atrium: The atrium was mildly dilated. - Pulmonary arteries: Systolic pressure was mildly to moderately increased. PA peak pressure: 47mm Hg (S). -  Pericardium, extracardiac: A trivial pericardial effusion was identified.  MRA Head. 03/2009 :  IMPRESSION: Minimal increase in size of right supraclinoid aneurysm now with maximal dimension of 15 mm versus prior 12 mm. Similar appearance of left supraclinoid 6 mm aneurysm. Similar appearance of bulge of the left aspect of the basilar artery.Atherosclerotic type changes with abrupt cut off of proximal left middle cerebral artery branch vessel consistent with the patient's acute infarct.  Physical Exam: Blood pressure 105/44, pulse 76, temperature 98 F (36.7 C), temperature source Oral, resp. rate 20, height 5\' 9"  (1.753 m), weight 169 lb 8.5 oz (76.9 kg), SpO2 93.00%.   General: Well developed, well nourished, in no acute distress, poor historian. Head: Eyes PERRLA, No xanthomas.   Normal cephalic and atramatic  Lungs: Clear bilaterally to auscultation and percussion.  Heart: HRRR S1 S2, occasional irregularity, without MRG.  Pulses are 2+ & equal.            No carotid bruit. No JVD.  No abdominal bruits. No femoral bruits. Abdomen: Bowel sounds are positive, abdomen soft with some tenderness without masses or                  Hernia's noted. Msk:  Kyphosis is noted. Normal strength and tone for age. Extremities: No clubbing, cyanosis or edema.  DP +1 Neuro: Alert and oriented X 3, but has some confusion of recent events. Psych: Flat affect, responds appropriately   Lab Results  Component Value Date   WBC 3.7* 06/22/2012   HGB 9.5* 06/22/2012   HCT 30.2* 06/22/2012   MCV 77.4* 06/22/2012   PLT 219 06/22/2012     Recent Labs Lab 06/21/12 1051 06/22/12 0651  NA 138 139  K 4.1 4.0  CL 105 108  CO2 23 23  BUN 25* 18  CREATININE 1.13* 1.03  CALCIUM 8.5 8.2*  PROT 6.9  --   BILITOT 0.3  --   ALKPHOS 110  --   ALT 24  --   AST 34  --   GLUCOSE 95 94   Lab Results  Component Value Date   CKTOTAL 166 03/19/2009   CKMB 0.8 03/19/2009   TROPONINI <0.30 06/21/2012    Lab  Results  Component Value Date   CHOL  Value: 112        ATP III CLASSIFICATION:  <200     mg/dL   Desirable  454-098  mg/dL   Borderline High  >=119    mg/dL   High        14/10/8293   Lab Results  Component Value Date   HDL 58 03/16/2009   Lab Results  Component Value Date   LDLCALC  Value: 33        Total Cholesterol/HDL:CHD Risk Coronary Heart Disease Risk Table                     Men   Women  1/2 Average Risk   3.4   3.3  Average Risk       5.0   4.4  2 X Average Risk   9.6   7.1  3 X Average Risk  23.4   11.0        Use the calculated Patient Ratio above and the CHD Risk Table to determine the patient's CHD Risk.        ATP III CLASSIFICATION (LDL):  <100     mg/dL   Optimal  621-308  mg/dL   Near or Above                    Optimal  130-159  mg/dL   Borderline  657-846  mg/dL   High  >962     mg/dL   Very High 95/05/8411   Lab Results  Component Value Date   TRIG 106 03/16/2009   Lab Results  Component Value Date   CHOLHDL 1.9 03/16/2009   Radiology: CXR 06/21/2012 Findings: Cardiomegaly again noted. Mild elevation of the left hemidiaphragm with minimal basilar scarring again noted. There is no evidence of focal airspace disease, pulmonary edema, suspicious pulmonary nodule/mass, pleural effusion, or pneumothorax. No acute bony abnormalities are identified.  IMPRESSION: Cardiomegaly without evidence of acute cardiopulmonary disease.  EKG: Normal sinus rhythm with evidence of LVH.  Telemetry: Paroxysmal atrial fibrillation,  occasionally with slow conduction in AF; no significant pauses with conversion to sinus rhythm.  ASSESSMENT AND PLAN:   1. Sick Sinus Syndrome: This is found transiently during the early afternoon around 12:30 while she was eating her lunch. According to son at bedside she was having some dizzy spells during that time. She subsequently returned to normal sinus rhythm. She did not have syncope. This may be etiology of her syncopal episode prior to admission.  At age 84, doubt placing pacemaker would be optimal therapy at this time with multiple core morbidities. She is currently on 2  AV nodal blockers; metoprolol 100 mg daily along with diltiazem and clonidine 0.1 mg BID. She is mildly hypotensive with blood pressure ranges between 105/44 -142/64 during admission. We will decrease metoprolol, secondary to this episode. Clonidine has been known to cause significant bradycardia but not heart block. May need to adjust other medications to assist with blood pressure control once beta blocker has been discontinued.  Continue to monitor on telemetry for recurrent heart block.  2. CAD: As recent cardiac catheterization that I can find record of this in 2001. She was found to have a totally occluded right coronary artery at the time with collaterals to the distal vessel. She did have some non-obstructive disease of her LAD and 30% proximally and 40% distally. Continue aspirin.  3. Hypertension: Blood pressure is soft currently. His may be related to dehydration in the setting of watery diarrhea for the last 3 days. At home she is on Lasix 20 mg daily along with losartan, calcium channel blocker, clonidine, and metoprolol.  We will need to begin to titrate down and even ultimately discontinue medications if she remains hypotensive. When she is hydrated we will check orthostatics.  4. 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 It also stated she had atherosclerotic type changes with abrupt cut off of the proximal left middle cerebral artery branch consistent with acute infarct.  5.Gastritis 6. Hypercholesterolemia  Bettey Mare. Lyman Bishop NP [facility] 06/23/2012, 3:41 PM [phone]  Cardiology Attending Patient interviewed and examined. Discussed with Joni Reining, NP.  Above note annotated and modified based upon my findings.  Patient presents with syncope,  most likely related to bradycardic response in atrial fibrillation.  Based upon her history, dehydration is a consideration; however, BUN and creatinine are normal. Mild anemia probably is not contributing to her problems. Metoprolol and diltiazem will be discontinued for now in the face of symptomatic bradycardia arrhythmias; however, if ventricular response becomes rapid in atrial fibrillation, resumption of beta blocker will be necessary.  Chronic anticoagulation appropriate if it can be done safely. This will likely depend upon whether patient returns home to live alone or another situation is found for her. Anticoagulation will be deferred for now.  Isosorbide mononitrate is likely not contributing much to her treatment and can be discontinued as well. Blood pressure will be monitored and antihypertensive medications adjusted accordingly.  Coleman Bing, MD 06/23/2012, 5:56 PM

## 2012-06-24 ENCOUNTER — Encounter (HOSPITAL_COMMUNITY)
Admission: EM | Disposition: A | Discharge status: Home Health | Payer: Self-pay | Source: Home / Self Care | Attending: Internal Medicine

## 2012-06-24 ENCOUNTER — Encounter (HOSPITAL_COMMUNITY): Payer: Self-pay | Admitting: Gastroenterology

## 2012-06-24 DIAGNOSIS — R933 Abnormal findings on diagnostic imaging of other parts of digestive tract: Secondary | ICD-10-CM

## 2012-06-24 DIAGNOSIS — K209 Esophagitis, unspecified: Secondary | ICD-10-CM

## 2012-06-24 DIAGNOSIS — I369 Nonrheumatic tricuspid valve disorder, unspecified: Secondary | ICD-10-CM

## 2012-06-24 DIAGNOSIS — A0472 Enterocolitis due to Clostridium difficile, not specified as recurrent: Secondary | ICD-10-CM

## 2012-06-24 DIAGNOSIS — K449 Diaphragmatic hernia without obstruction or gangrene: Secondary | ICD-10-CM

## 2012-06-24 DIAGNOSIS — K921 Melena: Secondary | ICD-10-CM

## 2012-06-24 DIAGNOSIS — D649 Anemia, unspecified: Secondary | ICD-10-CM

## 2012-06-24 HISTORY — PX: ESOPHAGOGASTRODUODENOSCOPY (EGD) WITH ESOPHAGEAL DILATION: SHX5812

## 2012-06-24 LAB — BASIC METABOLIC PANEL
BUN: 56 mg/dL — ABNORMAL HIGH (ref 6–23)
Calcium: 7.8 mg/dL — ABNORMAL LOW (ref 8.4–10.5)
Creatinine, Ser: 2.06 mg/dL — ABNORMAL HIGH (ref 0.50–1.10)
GFR calc non Af Amer: 20 mL/min — ABNORMAL LOW (ref >90)
Glucose, Bld: 121 mg/dL — ABNORMAL HIGH (ref 70–99)

## 2012-06-24 LAB — ABO/RH: ABO/RH(D): A POS

## 2012-06-24 LAB — KOH PREP: KOH Prep: NONE SEEN

## 2012-06-24 LAB — CBC
MCH: 24.1 pg — ABNORMAL LOW (ref 26.0–34.0)
MCHC: 31.3 g/dL (ref 30.0–36.0)
Platelets: 200 10*3/uL (ref 150–400)

## 2012-06-24 LAB — PREPARE RBC (CROSSMATCH)

## 2012-06-24 SURGERY — ESOPHAGOGASTRODUODENOSCOPY (EGD) WITH ESOPHAGEAL DILATION
Anesthesia: Moderate Sedation

## 2012-06-24 MED ORDER — STERILE WATER FOR IRRIGATION IR SOLN
Status: DC | PRN
  Administered 2012-06-24: 13:00:00

## 2012-06-24 MED ORDER — MIDAZOLAM HCL 5 MG/5ML IJ SOLN
INTRAMUSCULAR | Status: DC | PRN
  Administered 2012-06-24: 2 mg via INTRAVENOUS

## 2012-06-24 MED ORDER — SODIUM CHLORIDE 0.9 % IV SOLN
INTRAVENOUS | Status: DC
  Administered 2012-06-24: 13:00:00 via INTRAVENOUS

## 2012-06-24 MED ORDER — SUCRALFATE 1 GM/10ML PO SUSP
1.0000 g | Freq: Three times a day (TID) | ORAL | Status: DC
  Administered 2012-06-24 – 2012-06-27 (×13): 1 g via ORAL
  Filled 2012-06-24 (×13): qty 10

## 2012-06-24 MED ORDER — MIDAZOLAM HCL 5 MG/5ML IJ SOLN
INTRAMUSCULAR | Status: AC
  Filled 2012-06-24: qty 10

## 2012-06-24 MED ORDER — MEPERIDINE HCL 100 MG/ML IJ SOLN
INTRAMUSCULAR | Status: AC
  Filled 2012-06-24: qty 2

## 2012-06-24 MED ORDER — BUTAMBEN-TETRACAINE-BENZOCAINE 2-2-14 % EX AERO
INHALATION_SPRAY | CUTANEOUS | Status: DC | PRN
  Administered 2012-06-24: 2 via TOPICAL

## 2012-06-24 MED ORDER — MEPERIDINE HCL 100 MG/ML IJ SOLN
INTRAMUSCULAR | Status: DC | PRN
  Administered 2012-06-24: 25 mg via INTRAVENOUS

## 2012-06-24 MED ORDER — POTASSIUM CHLORIDE IN NACL 20-0.9 MEQ/L-% IV SOLN
INTRAVENOUS | Status: DC
  Administered 2012-06-24 – 2012-06-27 (×4): via INTRAVENOUS

## 2012-06-24 MED ORDER — ONDANSETRON HCL 4 MG/2ML IJ SOLN
INTRAMUSCULAR | Status: DC | PRN
  Administered 2012-06-24: 4 mg via INTRAVENOUS

## 2012-06-24 MED ORDER — FAMOTIDINE IN NACL 20-0.9 MG/50ML-% IV SOLN
20.0000 mg | Freq: Two times a day (BID) | INTRAVENOUS | Status: DC
  Administered 2012-06-24 – 2012-06-27 (×6): 20 mg via INTRAVENOUS
  Filled 2012-06-24 (×12): qty 50

## 2012-06-24 MED ORDER — ONDANSETRON HCL 4 MG/2ML IJ SOLN
INTRAMUSCULAR | Status: AC
  Filled 2012-06-24: qty 2

## 2012-06-24 MED ORDER — POTASSIUM CHLORIDE CRYS ER 20 MEQ PO TBCR
20.0000 meq | EXTENDED_RELEASE_TABLET | Freq: Three times a day (TID) | ORAL | Status: AC
  Administered 2012-06-24 – 2012-06-25 (×6): 20 meq via ORAL
  Filled 2012-06-24 (×6): qty 1

## 2012-06-24 MED ORDER — POTASSIUM CHLORIDE IN NACL 20-0.9 MEQ/L-% IV SOLN
INTRAVENOUS | Status: DC
  Administered 2012-06-24: 10:00:00 via INTRAVENOUS

## 2012-06-24 NOTE — Progress Notes (Signed)
*  PRELIMINARY RESULTS* Echocardiogram 2D Echocardiogram has been performed.  Conrad [place] 06/24/2012, 3:37 PM

## 2012-06-24 NOTE — Progress Notes (Signed)
PT Cancellation Note  Patient Details Name: [name] MRN: [id number] DOB: 14-May-1917   Cancelled Treatment:    Reason Eval/Treat Not Completed: Medical issues which prohibited therapy;Other (comment) Pt sleeping soundly at the time of my arrival.  Her son states that she is to have an EGD today and asked that I not awaken her.  We will try again tomorrow.  [name] L 06/24/2012, 11:02 AM

## 2012-06-24 NOTE — Progress Notes (Signed)
[name], [name]             ACCOUNT NO.:  [id number]  MEDICAL RECORD NO.:  [id number]  LOCATION:  A326                          FACILITY:  [facility]  PHYSICIAN:  Kingsley Callander. Ouida Sills, MD       DATE OF BIRTH:  [birth year]  DATE OF PROCEDURE:  06/24/2012 DATE OF DISCHARGE:                                PROGRESS NOTE   SUBJECTIVE:  Mrs. Mattice states that she feels fine.  A Flexi-Seal has been placed.  She has a large amount of liquid black stool in it.  She has very little appetite and has not eaten well.  She developed Mobitz type 2 second-degree heart block yesterday afternoon.  She was seen by Cardiology.  Diltiazem and metoprolol were discontinued.  OBJECTIVE:  VITAL SIGNS:  Temperature 97.7, pulse 56, blood pressure 98/54, respirations 20. GENERAL:  Alert and comfortable. LUNGS:  Clear. HEART:  Regular with no murmurs. ABDOMEN:  Soft and nontender.  Bowel sounds are present.  The Flexi-Seal bag is filled with black liquid stool.  IMPRESSION/PLAN: 1. Gastrointestinal bleed.  Her hemoglobin has dropped to 7.9.  She     will be typed, crossed and transfused.  A Gastroenterology consult     will be obtained.  Continue pantoprazole, discontinue Lovenox and     baby aspirin.  Her BUN and creatinine increased with this GI bleed     from 18 and 1.03 to 56 and 2.06.  She will be hydrated with normal     saline.  She has developed a mild hypokalemia of 3.3, which will be     treated with supplemental potassium. 2. Second-degree heart block.  Observe, off diltiazem and metoprolol.     Appreciate Cardiology consult. 3. Iron-deficiency anemia. 4. History of stroke. 5. Clostridium difficile colitis.  Continue metronidazole. 6. Syncope, no recurrence.     Kingsley Callander. Ouida Sills, MD     ROF/MEDQ  D:  06/24/2012  T:  06/24/2012  Job:  [id number]

## 2012-06-24 NOTE — Consult Note (Signed)
Referring Provider: Dr. Ouida Sills Primary Gastroenterologist:  Dr. Jena Gauss   Date of Admission: 06/21/12 Date of Consultation: 06/24/12  Reason for Consultation:  Anemia, melena   HPI:  [name] is a pleasant 79 year old female who was originally admitted 3/15 secondary to syncope, presumed gastroenteritis, IDA. Found to be positive for Cdiff and started on Flagyl. Cardiology was also consulted secondary to sick sinus syndrome, afib. Hgb has dropped from 10.2 on admission to now 7.9. Rectal tube noted with black stool.  Denies abdominal pain. No N/V. Notes decreased appetite since hospitalization over the last few days. Occasional vague esophageal dysphagia, swallows 2-3 times with resolution. Denies changes in weight. She denies any use of NSAIDs or aspirin powders. On 81 mg ASA daily.   She did eat a 1/4 of french toast this morning. Last dose of Lovenox yesterday around noon.    Past Medical History  Diagnosis Date  . Hypertension   . Stroke   . CHF (congestive heart failure)   . GERD (gastroesophageal reflux disease)   . High cholesterol     Past Surgical History  Procedure Laterality Date  . Knee surgery    . Colonoscopy  2006    Dr. Karilyn Cota: ischemic colitis involving descending colon, extensive diverticula    Prior to Admission medications   Medication Sig Start Date End Date Taking? Authorizing Provider  aspirin EC 81 MG tablet Take 81 mg by mouth daily.   Yes Historical Provider, MD  cloNIDine (CATAPRES) 0.1 MG tablet Take 0.1 mg by mouth 2 (two) times daily.   Yes Historical Provider, MD  diltiazem (DILACOR XR) 240 MG 24 hr capsule Take 240 mg by mouth daily.   Yes Historical Provider, MD  fish oil-omega-3 fatty acids 1000 MG capsule Take 1 g by mouth daily.   Yes Historical Provider, MD  furosemide (LASIX) 20 MG tablet Take 20 mg by mouth daily.   Yes Historical Provider, MD  isosorbide mononitrate (IMDUR) 60 MG 24 hr tablet Take 30 mg by mouth 2 (two) times daily.   Yes  Historical Provider, MD  losartan (COZAAR) 100 MG tablet Take 100 mg by mouth daily.   Yes Historical Provider, MD  metoprolol (LOPRESSOR) 100 MG tablet Take 100 mg by mouth 2 (two) times daily.   Yes Historical Provider, MD  Multiple Vitamin (MULTIVITAMIN WITH MINERALS) TABS Take 1 tablet by mouth daily.   Yes Historical Provider, MD  omeprazole (PRILOSEC) 20 MG capsule Take 20 mg by mouth daily.   Yes Historical Provider, MD  simvastatin (ZOCOR) 10 MG tablet Take 10 mg by mouth at bedtime.   Yes Historical Provider, MD    Current Facility-Administered Medications  Medication Dose Route Frequency Provider Last Rate Last Dose  . 0.9 % NaCl with KCl 20 mEq/ L  infusion   Intravenous Continuous Carylon Perches, MD      . acetaminophen (TYLENOL) tablet 650 mg  650 mg Oral Q6H PRN Carylon Perches, MD       Or  . acetaminophen (TYLENOL) suppository 650 mg  650 mg Rectal Q6H PRN Carylon Perches, MD      . alum & mag hydroxide-simeth (MAALOX/MYLANTA) 200-200-20 MG/5ML suspension 30 mL  30 mL Oral Q6H PRN Carylon Perches, MD      . amLODipine (NORVASC) tablet 5 mg  5 mg Oral Daily Kathlen Brunswick, MD      . cloNIDine (CATAPRES) tablet 0.1 mg  0.1 mg Oral BID Carylon Perches, MD   0.1 mg at 06/23/12  2151  . losartan (COZAAR) tablet 100 mg  100 mg Oral Daily Carylon Perches, MD   100 mg at 06/23/12 1200  . metroNIDAZOLE (FLAGYL) tablet 500 mg  500 mg Oral Q8H Carylon Perches, MD   500 mg at 06/24/12 0548  . ondansetron (ZOFRAN) tablet 4 mg  4 mg Oral Q6H PRN Carylon Perches, MD   4 mg at 06/23/12 1342   Or  . ondansetron [facility]) injection 4 mg  4 mg Intravenous Q6H PRN Carylon Perches, MD   4 mg at 06/23/12 4098  . pantoprazole (PROTONIX) EC tablet 40 mg  40 mg Oral Daily Carylon Perches, MD   40 mg at 06/23/12 1202  . potassium chloride SA (K-DUR,KLOR-CON) CR tablet 20 mEq  20 mEq Oral TID Carylon Perches, MD      . simvastatin (ZOCOR) tablet 10 mg  10 mg Oral QHS Carylon Perches, MD   10 mg at 06/23/12 2151    Allergies as of 06/21/2012  . (No Known Allergies)     Family History  Problem Relation Age of Onset  . Cancer Mother   . Cancer Sister   . Heart attack Father   . Colon cancer Neg Hx     History   Social History  . Marital Status: Widowed    Spouse Name: N/A    Number of Children: N/A  . Years of Education: N/A   Occupational History  . Not on file.   Social History Main Topics  . Smoking status: Never Smoker   . Smokeless tobacco: Not on file  . Alcohol Use: No  . Drug Use: No  . Sexually Active: Not on file   Other Topics Concern  . Not on file   Social History Narrative  . No narrative on file    Review of Systems: Gen: Denies fever, chills, loss of appetite, change in weight or weight loss CV:SEE HPI Resp: Denies shortness of breath with rest, cough, wheezing GI: SEE HPI GU : Denies urinary burning, urinary frequency, urinary incontinence.  MS: +joint pain  Derm: Denies rash, itching, dry skin Psych: +short-term memory loss Heme: Denies bruising, bleeding, and enlarged lymph nodes.  Physical Exam: Vital signs in last 24 hours: Temp:  [97.7 F (36.5 C)-98.5 F (36.9 C)] 97.7 F (36.5 C) (03/18 0447) Pulse Rate:  [38-76] 56 (03/18 0447) Resp:  [20] 20 (03/18 0447) BP: (84-123)/(44-74) 98/54 mmHg (03/18 0524) SpO2:  [92 %-96 %] 96 % (03/18 0447) Weight:  [173 lb 4.5 oz (78.6 kg)] 173 lb 4.5 oz (78.6 kg) (03/18 0447) Last BM Date: 06/24/12 (rectal tube) General:   Alert,  Well-developed, well-nourished, pleasant and cooperative in NAD Head:  Normocephalic and atraumatic. Eyes:  Sclera clear, no icterus.   Conjunctiva pink. Ears:  Normal auditory acuity. Nose:  No deformity, discharge,  or lesions. Mouth:  No deformity or lesions, dentition normal. Neck:  Supple; no masses or thyromegaly. Lungs:  Clear throughout to auscultation.   No wheezes, crackles, or rhonchi. No acute distress. Heart: S1 S2 present Abdomen:  Soft, nontender and nondistended. No masses, hepatosplenomegaly or hernias noted.  Normal bowel sounds, without guarding, and without rebound.   Rectal:  Deferred  Msk:  Symmetrical without gross deformities.  Extremities:  Without clubbing or edema. Neurologic:  Alert and  oriented x4;  grossly normal neurologically. Skin:  Intact without significant lesions or rashes. Cervical Nodes:  No significant cervical adenopathy. Psych:  Alert and cooperative. Normal mood and affect.  Intake/Output from previous  day: 03/17 0701 - 03/18 0700 In: 100 [P.O.:100] Out: -  Intake/Output this shift: Total I/O In: 240 [P.O.:240] Out: -   Lab Results:  Recent Labs  06/21/12 1051 06/22/12 0651 06/24/12 0526  WBC 5.7 3.7* 8.6  HGB 10.2* 9.5* 7.9*  HCT 32.5* 30.2* 25.2*  PLT 212 219 200   BMET  Recent Labs  06/21/12 1051 06/22/12 0651 06/24/12 0526  NA 138 139 137  K 4.1 4.0 3.3*  CL 105 108 108  CO2 23 23 17*  GLUCOSE 95 94 121*  BUN 25* 18 56*  CREATININE 1.13* 1.03 2.06*  CALCIUM 8.5 8.2* 7.8*   LFT  Recent Labs  06/21/12 1051  PROT 6.9  ALBUMIN 3.2*  AST 34  ALT 24  ALKPHOS 110  BILITOT 0.3   CDIFF PCR POSITIVE  Impression: 79 year old pleasant female admitted with syncopal episodes, found to be positive for Cdiff and started on Flagyl 3/15. Cardiology is also involved in her care due to afib, sick sinus syndrome. No anticoagulation currently. Hgb has dropped almost 3 grams since admission, with evidence of IDA. Associated black stool. She denies any prior use of NSAIDs or aspirin powders. No blood thinners, only taking 81mg  ASA daily. She denies any upper GI complaints except for vague esophageal dysphagia intermittently with food. She is unable to characterize this further. She will need further evaluation of her upper GI tract in the way of an upper endoscopy today. Lovenox has been held, with last dose yesterday around noon. She ate a 1/4 of french toast this morning, but she has been NPO since.   As of note, her last colonoscopy was in 2006 with  Dr. Karilyn Cota. Findings of ischemic colitis and diverticulosis.   Plan: Proceed with upper endoscopy and possible dilation in the near future with Dr. Jena Gauss. The risks, benefits, and alternatives have been discussed in detail with patient. They have stated understanding and desire to proceed.  NPO now Continue to hold Lovenox Continue Flagyl for Cdiff (started 3/15) Further recommendations to follow    LOS: 3 days    06/24/2012, 10:06 AM  Briefly discussed with Dr. Ouida Sills earlier today. Agree with endoscopic evaluation as outlined. Further recommendations to follow

## 2012-06-24 NOTE — Progress Notes (Signed)
SUBJECTIVE: Denies complaints of recurrent dizziness, nausea vomiting diarrhea.   Active Problems:   Syncope   Essential hypertension, benign   CVA (cerebral infarction)   GERD (gastroesophageal reflux disease)   Noninfectious gastroenteritis and colitis   Bradycardia   LABS: Basic Metabolic Panel:  Recent Labs  09/81/19 0651 06/24/12 0526  NA 139 137  K 4.0 3.3*  CL 108 108  CO2 23 17*  GLUCOSE 94 121*  BUN 18 56*  CREATININE 1.03 2.06*  CALCIUM 8.2* 7.8*   Liver Function Tests:  Recent Labs  06/21/12 1051  AST 34  ALT 24  ALKPHOS 110  BILITOT 0.3  PROT 6.9  ALBUMIN 3.2*   CBC:  Recent Labs  06/21/12 1051 06/22/12 0651 06/24/12 0526  WBC 5.7 3.7* 8.6  NEUTROABS 3.9  --   --   HGB 10.2* 9.5* 7.9*  HCT 32.5* 30.2* 25.2*  MCV 77.4* 77.4* 76.8*  PLT 212 219 200   Cardiac Enzymes:  Recent Labs  06/21/12 1051  TROPONINI <0.30     PHYSICAL EXAM BP 98/54  Pulse 56  Temp(Src) 97.7 F (36.5 C) (Oral)  Resp 20  Ht 5\' 9"  (1.753 m)  Wt 173 lb 4.5 oz (78.6 kg)  BMI 25.58 kg/m2  SpO2 96% General: Well developed, well nourished, in no acute distress, still frail.  Head: Eyes PERRLA, No xanthomas.   Normal cephalic and atramatic  Lungs: Clear bilaterally to auscultation and percussion. Heart: HRIR S1 S2, No MRG .  Pulses are 2+ & equal.            No carotid bruit. No JVD.  No abdominal bruits. No femoral bruits. Abdomen: Bowel sounds are positive, abdomen soft and non-tender without masses or                  Hernia's noted. Msk:  Back normal, normal gait. Normal strength and tone for age. Extremities: No clubbing, cyanosis or edema.  DP +1 Neuro: Alert and oriented X 3. Psych:  Flat  affect, responds appropriately  TELEMETRY: Reviewed telemetry pt in atrial fibrillation rates between 70 and 80:  ASSESSMENT AND PLAN:  1. Sick Sinus Syndrome: This is found transiently during the early afternoon around 12:30 while she was eating her lunch.  Appears to be Mobitz 2 heart block. According to son at bedside she was having some dizzy spells during that time. She subsequently returned to normal sinus rhythm, but is now in atrial fibrillation overnight with rate controlled after being taken off of Cardizem and metoprolol. Review of telemetry did not show recurrence of bradycardia or heart block over the last 24 hours. Consideration for anticoagulation will need to be discussed. With comorbidities uncertain if she would be a candidate.   2. CAD: Most cardiac catheterization that I can find record of this in 2001. She was found to have a totally occluded right coronary artery at the time with collaterals to the distal vessel. She did have some non-obstructive disease of her LAD and 30% proximally and 40% distally. Continue aspirin.   3. Hypertension: Blood pressure has improved from hypotension after removal of metoprolol and Cardizem. This may have been related to dehydration in the setting of watery diarrhea for the last 3 days. At home she is on Lasix 20 mg daily along with losartan, calcium channel blocker, clonidine, and metoprolol. Renal function has worsened overnight however. Creatinine rising from 1.03 -2.06. Isosorbide was discontinued yesterday as well. Will decrease losartan to 50 mg daily and increase  amlodipine to 10 mg daily. She is not on diuretics during this admission. Continue hydration. Echocardiogram is ordered for LV function.  4. 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 It also stated she had atherosclerotic type changes with abrupt cut off of the proximal left middle cerebral artery branch consistent with acute infarct.   5.Gastritis: Symptoms have improved without recurrence of watery diarrhea or nausea.   6. Hypercholesterolemia   7.Acute Renal Failure: Creatinine increase substantially overnight from 1.03-2.06. She continues  on IV hydration with normal saline at 75 cc an hour. She is not on diuretics. Repeat BMET in am.   8. Hypokalemia: Replete today.    Patient examined chart reviewed.  Doing well this am with no dizzyness.  AV nodal drugs stopped by Dr Dietrich Pates yesterday. No indication for pacer at this time. Continue to hydrate for elevated Cr and follow BMET.  Charlton Haws

## 2012-06-24 NOTE — Op Note (Signed)
[facility] [address] [place], [address]   ENDOSCOPY PROCEDURE REPORT  PATIENT: Dawn, Gibson  MR#: [id number] BIRTHDATE: January 18, 1918 , 89  yrs. old GENDER: Female ENDOSCOPIST: R.  Roetta Sessions, MD FACP FACG REFERRED BY:  Carylon Perches, M.D. PROCEDURE DATE:  06/24/2012 PROCEDURE:     EGD with KOH esophageal brushing, biopsy, gastric and duodenal biopsy  INDICATIONS:     Melena in the setting of gastroenteritis / C. difficile infection  INFORMED CONSENT:   The risks, benefits, limitations, alternatives and imponderables have been discussed.  The potential for biopsy, esophogeal dilation, etc. have also been reviewed.  Questions have been answered.  All parties agreeable.  Please see the history and physical in the medical record for more information.  MEDICATIONS:    Versed 2 mg IV and Demerol 25 mg IV. Zofran 4 mg IV. Cetacaine spray.  DESCRIPTION OF PROCEDURE:   The EG-[id number] ([id number])  endoscope was introduced through the mouth and advanced to the second portion of the duodenum without difficulty or limitations.  The mucosal surfaces were surveyed very carefully during advancement of the scope and upon withdrawal.  Retroflexion view of the proximal stomach and esophagogastric junction was performed.      FINDINGS: Severely inflamed appearing esophagus with esophagitis involving the distal one third of the tubular esophagus. There were overlying cream-colored plaques present. The tubular esophagus was patent throughout its course. Stomach empty. Small hiatal hernia. Diffuse "fish scale" appearance of the gastric mucosa of uncertain significance. No ulcer or infiltrating process seen. Patent pylorus.  Examination of the bulb and second portion revealed patchy loss of the villous tips with superficial erosions present diffusely.  THERAPEUTIC / DIAGNOSTIC MANEUVERS PERFORMED:  Esophageal mucosa was brushed for KOH prep. Subsequently, the esophageal  mucosa was biopsied. The abnormal gastric mucosa was biopsied. Finally, the abnormal-appearing duodenal mucosa was biopsied.  COMPLICATIONS:  None  IMPRESSION:  Marked esophagitis-suspicious for Candida esophagitis Status post brushing and biopsy. Hiatal hernia. Abnormal-appearing gastric mucosa-status post biopsy. Duodenal erosions-status post biopsy.    The esophageal mucosa was markedly inflamed and could have easily bled enough recently to produce a melenic stool.  RECOMMENDATIONS:   Allow a clear liquid diet for now. Followup on brushing and biopsies. Further recommendations to follow.    _______________________________ R. Roetta Sessions, MD FACP [facility] eSigned:  R. Roetta Sessions, MD FACP [facility] 06/24/2012 2:02 PM     CC:  PATIENT NAME:  Dawn, Gibson MR#: [id number]

## 2012-06-24 NOTE — Progress Notes (Signed)
KOH negative for yeast; recommend we add Carafate suspension QID to regimen; stop PPI; begin Pepsid 20 mg IV BID; will re-assess in am Review biopsies as they come available

## 2012-06-25 LAB — TYPE AND SCREEN
ABO/RH(D): A POS
Antibody Screen: NEGATIVE

## 2012-06-25 LAB — OCCULT BLOOD X 1 CARD TO LAB, STOOL: Fecal Occult Bld: POSITIVE — AB

## 2012-06-25 LAB — URINALYSIS, DIPSTICK ONLY
Glucose, UA: NEGATIVE mg/dL
Protein, ur: NEGATIVE mg/dL
Specific Gravity, Urine: 1.015 (ref 1.005–1.030)
Urobilinogen, UA: 0.2 mg/dL (ref 0.0–1.0)

## 2012-06-25 LAB — BASIC METABOLIC PANEL
BUN: 47 mg/dL — ABNORMAL HIGH (ref 6–23)
Chloride: 111 mEq/L (ref 96–112)
GFR calc Af Amer: 27 mL/min — ABNORMAL LOW (ref >90)
Potassium: 4.5 mEq/L (ref 3.5–5.1)

## 2012-06-25 LAB — CBC
HCT: 32.8 % — ABNORMAL LOW (ref 36.0–46.0)
Hemoglobin: 10.5 g/dL — ABNORMAL LOW (ref 12.0–15.0)
WBC: 9.4 10*3/uL (ref 4.0–10.5)

## 2012-06-25 MED ORDER — CIPROFLOXACIN HCL 250 MG PO TABS
250.0000 mg | ORAL_TABLET | Freq: Two times a day (BID) | ORAL | Status: DC
  Administered 2012-06-25 – 2012-06-27 (×4): 250 mg via ORAL
  Filled 2012-06-25 (×4): qty 1

## 2012-06-25 NOTE — Progress Notes (Signed)
Patient's urine came back with nitrites, leukocytes, and the hgb dipstick came back with a large amount. Dr. Juanetta Gosling was made aware and new orders were given for Cipro 250mg  BID PO. Will continue to monitor patient.

## 2012-06-25 NOTE — Progress Notes (Signed)
UR Chart Review Completed  

## 2012-06-25 NOTE — Progress Notes (Signed)
Subjective: This is a patient of Dr. Ouida Sills who has had a complicated course while in the hospital. She was admitted with what seemed to be a gastroenteritis-like picture was found to be positive for C. difficile which is been treated she's had trouble with syncope and then bradycardic. Her medications have been altered. She was then found to have GI bleeding with severe esophagitis. She says she feels a little better. Her renal function had deteriorated but he is improving  Objective: Vital signs in last 24 hours: Temp:  [97.3 F (36.3 C)-98.3 F (36.8 C)] 97.9 F (36.6 C) (03/19 0615) Pulse Rate:  [65-103] 76 (03/19 0615) Resp:  [15-22] 18 (03/19 0615) BP: (70-129)/(27-83) 129/66 mmHg (03/19 0615) SpO2:  [95 %-100 %] 100 % (03/19 0615) Weight:  [81.4 kg (179 lb 7.3 oz)] 81.4 kg (179 lb 7.3 oz) (03/19 0615) Weight change: 2.8 kg (6 lb 2.8 oz) Last BM Date: 06/24/12 (flexiseal )  Intake/Output from previous day: 03/18 0701 - 03/19 0700 In: 1528 [P.O.:360; I.V.:500; Blood:268; IV Piggyback:400] Out: -   PHYSICAL EXAM General appearance: alert, cooperative and no distress Resp: clear to auscultation bilaterally Cardio: regular rate and rhythm, S1, S2 normal, no murmur, click, rub or gallop GI: soft, non-tender; bowel sounds normal; no masses,  no organomegaly Extremities: extremities normal, atraumatic, no cyanosis or edema  Lab Results:    Basic Metabolic Panel:  Recent Labs  16/10/96 0526 06/25/12 0608  NA 137 138  K 3.3* 4.5  CL 108 111  CO2 17* 19  GLUCOSE 121* 106*  BUN 56* 47*  CREATININE 2.06* 1.81*  CALCIUM 7.8* 8.4   Liver Function Tests: No results found for this basename: AST, ALT, ALKPHOS, BILITOT, PROT, ALBUMIN,  in the last 72 hours No results found for this basename: LIPASE, AMYLASE,  in the last 72 hours No results found for this basename: AMMONIA,  in the last 72 hours CBC:  Recent Labs  06/24/12 0526 06/25/12 0608  WBC 8.6 9.4  HGB 7.9* 10.5*   HCT 25.2* 32.8*  MCV 76.8* 78.5  PLT 200 198   Cardiac Enzymes: No results found for this basename: CKTOTAL, CKMB, CKMBINDEX, TROPONINI,  in the last 72 hours BNP: No results found for this basename: PROBNP,  in the last 72 hours D-Dimer: No results found for this basename: DDIMER,  in the last 72 hours CBG: No results found for this basename: GLUCAP,  in the last 72 hours Hemoglobin A1C: No results found for this basename: HGBA1C,  in the last 72 hours Fasting Lipid Panel: No results found for this basename: CHOL, HDL, LDLCALC, TRIG, CHOLHDL, LDLDIRECT,  in the last 72 hours Thyroid Function Tests: No results found for this basename: TSH, T4TOTAL, FREET4, T3FREE, THYROIDAB,  in the last 72 hours Anemia Panel:  Recent Labs  06/22/12 0921  VITAMINB12 609  FOLATE >20.0  FERRITIN 9*  TIBC 371  IRON 38*  RETICCTPCT 1.0   Coagulation: No results found for this basename: LABPROT, INR,  in the last 72 hours Urine Drug Screen: Drugs of Abuse  No results found for this basename: labopia, cocainscrnur, labbenz, amphetmu, thcu, labbarb    Alcohol Level: No results found for this basename: ETH,  in the last 72 hours Urinalysis: No results found for this basename: COLORURINE, APPERANCEUR, LABSPEC, PHURINE, GLUCOSEU, HGBUR, BILIRUBINUR, KETONESUR, PROTEINUR, UROBILINOGEN, NITRITE, LEUKOCYTESUR,  in the last 72 hours Misc. Labs:  ABGS No results found for this basename: PHART, PCO2, PO2ART, TCO2, HCO3,  in the  last 72 hours CULTURES Recent Results (from the past 240 hour(s))  CLOSTRIDIUM DIFFICILE BY PCR     Status: Abnormal   Collection Time    06/21/12  6:17 PM      Result Value Range Status   C difficile by pcr POSITIVE (*) NEGATIVE Final   Comment: RESULT CALLED TO, READ BACK BY AND VERIFIED WITH:      ROGERS,A ON 03`514 BY SMITH,N AT 1920  STOOL CULTURE     Status: None   Collection Time    06/21/12  6:18 PM      Result Value Range Status   Specimen Description STOOL    Final   Special Requests NONE   Final   Culture NO SUSPICIOUS COLONIES, CONTINUING TO HOLD   Final   Report Status PENDING   Incomplete  KOH PREP     Status: None   Collection Time    06/24/12 12:55 PM      Result Value Range Status   Specimen Description ESOPHAGUS BRUSHING   Final   Special Requests NONE   Final   KOH Prep NO YEAST OR FUNGAL ELEMENTS SEEN   Final   Report Status 06/24/2012 FINAL   Final   Studies/Results: No results found.  Medications:  Prior to Admission:  Prescriptions prior to admission  Medication Sig Dispense Refill  . aspirin EC 81 MG tablet Take 81 mg by mouth daily.      . cloNIDine (CATAPRES) 0.1 MG tablet Take 0.1 mg by mouth 2 (two) times daily.      Marland Kitchen diltiazem (DILACOR XR) 240 MG 24 hr capsule Take 240 mg by mouth daily.      . fish oil-omega-3 fatty acids 1000 MG capsule Take 1 g by mouth daily.      . furosemide (LASIX) 20 MG tablet Take 20 mg by mouth daily.      . isosorbide mononitrate (IMDUR) 60 MG 24 hr tablet Take 30 mg by mouth 2 (two) times daily.      Marland Kitchen losartan (COZAAR) 100 MG tablet Take 100 mg by mouth daily.      . metoprolol (LOPRESSOR) 100 MG tablet Take 100 mg by mouth 2 (two) times daily.      . Multiple Vitamin (MULTIVITAMIN WITH MINERALS) TABS Take 1 tablet by mouth daily.      Marland Kitchen omeprazole (PRILOSEC) 20 MG capsule Take 20 mg by mouth daily.      . simvastatin (ZOCOR) 10 MG tablet Take 10 mg by mouth at bedtime.       Scheduled: . amLODipine  5 mg Oral Daily  . cloNIDine  0.1 mg Oral BID  . famotidine (PEPCID) IV  20 mg Intravenous Q12H  . losartan  100 mg Oral Daily  . metroNIDAZOLE  500 mg Oral Q8H  . potassium chloride  20 mEq Oral TID  . simvastatin  10 mg Oral QHS  . sucralfate  1 g Oral TID WC & HS   Continuous: . 0.9 % NaCl with KCl 20 mEq / L 50 mL/hr at 06/24/12 2049   ZOX:[id number], acetaminophen, alum & mag hydroxide-simeth, ondansetron (ZOFRAN) IV, ondansetron  Assesment: She has had what appears to  be gastroenteritis. She didn't have a GI bleed and anemia requiring blood transfusion. She looks much better by history. Her renal function is a little better. Active Problems:   Syncope   Essential hypertension, benign   CVA (cerebral infarction)   GERD (gastroesophageal reflux disease)   Noninfectious gastroenteritis and  colitis   Bradycardia    Plan: I will advance her diet. Continue other treatments.    LOS: 4 days   Kenechukwu Eckstein L 06/25/2012, 8:51 AM

## 2012-06-25 NOTE — Progress Notes (Signed)
Patient positive hemoccult. Dr. Ouida Sills notified, no new orders.

## 2012-06-25 NOTE — Evaluation (Signed)
Physical Therapy Evaluation Patient Details Name: [name] MRN: [id number] DOB: 1917/06/13 Today's Date: 06/25/2012 Time: 4098-1191 PT Time Calculation (min): 42 min  PT Assessment / Plan / Recommendation Clinical Impression  Pt is a 79 year old female admited to [facility] for c-diff and referred to PT for mobility with following impairments listed below.  Attempted to have pt sit up in chair, but during transfer to sit in chair flexiseal fell out of pt.  Made nursing aware and placed pt back in bed.  At this time pt walked short distances and was more concerened with her IV and flexiseal which limited her mobility.  Requires mod A-supervision for most mobility.  recommened HHPT with 24/7 supervsion for safe return home as pt was ambulating with a SPC before coming to the hospital.     PT Assessment  Patient needs continued PT services    Follow Up Recommendations  Home health PT;Supervision/Assistance - 24 hour    Does the patient have the potential to tolerate intense rehabilitation      Barriers to Discharge        Equipment Recommendations  Rolling walker with 5" wheels    Recommendations for Other Services     Frequency Min 3X/week    Precautions / Restrictions     Pertinent Vitals/Pain Denies pain      Mobility  Bed Mobility Bed Mobility: Rolling Right;Right Sidelying to Sit Rolling Right: 7: Independent Right Sidelying to Sit: 3: Mod assist Transfers Transfers: Sit to Stand;Stand to Sit Sit to Stand: 5: Supervision;From bed;From chair/3-in-1;With upper extremity assist Stand to Sit: 5: Supervision;To chair/3-in-1;To bed Details for Transfer Assistance: cueing for handplacement from EOB and chair Ambulation/Gait Ambulation/Gait Assistance: 5: Supervision Ambulation Distance (Feet): 10 Feet Assistive device: Rolling walker Gait Pattern: Within Functional Limits    Exercises     PT Diagnosis: Difficulty walking;Generalized weakness  PT Problem List:  Decreased activity tolerance;Decreased balance;Decreased mobility PT Treatment Interventions: Gait training;DME instruction;Stair training;Functional mobility training;Therapeutic activities;Therapeutic exercise;Patient/family education   PT Goals Acute Rehab PT Goals PT Goal Formulation: With patient Time For Goal Achievement: 07/02/12 Potential to Achieve Goals: Good Pt will go Supine/Side to Sit: with min assist;with HOB 0 degrees PT Goal: Supine/Side to Sit - Progress: Goal set today Pt will go Sit to Stand: with modified independence;with upper extremity assist PT Goal: Sit to Stand - Progress: Goal set today Pt will go Stand to Sit: with modified independence;with upper extremity assist PT Goal: Stand to Sit - Progress: Goal set today Pt will Ambulate: 51 - 150 feet;with least restrictive assistive device PT Goal: Ambulate - Progress: Goal set today  Visit Information  Last PT Received On: 06/25/12    Subjective Data  Subjective: I can either go to my house or go to my daughters home.    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 x10 minutes  End of Session  PT - End of Session Equipment Utilized During Treatment: Gait belt Patient  left: in bed;with call bell/phone within reach Nurse Communication: Mobility status  GP     Mouhamadou Gittleman, PT 06/25/2012, 12:40 PM

## 2012-06-25 NOTE — Progress Notes (Addendum)
Subjective: Denies abdominal pain, N/V. Denies dysphagia. No complaints this morning.   Objective: Vital signs in last 24 hours: Temp:  [97.3 F (36.3 C)-98.3 F (36.8 C)] 97.9 F (36.6 C) (03/19 0615) Pulse Rate:  [65-103] 76 (03/19 0615) Resp:  [15-22] 18 (03/19 0615) BP: (70-129)/(27-83) 129/66 mmHg (03/19 0615) SpO2:  [95 %-100 %] 100 % (03/19 0615) Weight:  [179 lb 7.3 oz (81.4 kg)] 179 lb 7.3 oz (81.4 kg) (03/19 0615) Last BM Date: 06/24/12 (flexiseal ) General:   Alert and oriented, pleasant Head:  Normocephalic and atraumatic. Eyes:  No icterus, sclera clear. Conjuctiva pink.  Mouth:  Without lesions, mucosa pink and moist.  Heart:  S1, S2 present, no murmurs noted.  Lungs: Clear to auscultation bilaterally, without wheezing, rales, or rhonchi.  Abdomen:  Bowel sounds present, soft, non-tender, non-distended. No HSM or hernias noted. No rebound or guarding. No masses appreciated  Msk:  Symmetrical without gross deformities. Normal posture. Extremities:  Without clubbing or edema. Neurologic:  Alert and  oriented x4;  grossly normal neurologically. Skin:  Warm and dry, intact without significant lesions.  Psych:  Alert and cooperative. Normal mood and affect.  Intake/Output from previous day: 03/18 0701 - 03/19 0700 In: 1528 [P.O.:360; I.V.:500; Blood:268; IV Piggyback:400] Out: -  Intake/Output this shift:    Lab Results:  Recent Labs  06/24/12 0526 06/25/12 0608  WBC 8.6 9.4  HGB 7.9* 10.5*  HCT 25.2* 32.8*  PLT 200 198   BMET  Recent Labs  06/24/12 0526 06/25/12 0608  NA 137 138  K 3.3* 4.5  CL 108 111  CO2 17* 19  GLUCOSE 121* 106*  BUN 56* 47*  CREATININE 2.06* 1.81*  CALCIUM 7.8* 8.4   C-Diff PCR POSITIVE   Assessment: 79 year old female with Cdiff, IDA s/p EGD 3/18 showing significant esophagitis suspicious for candida esophagitis but negative KOH. Biopsies of duodenal / gastric mucosa pending. Melena and anemia may be secondary to  significant inflammation of esophagus (precipitated by lovenox and ongoing asa therapy). Hgb improved s/p 1u PRBCs yesterday.     Plan: Carafate suspension QID Pepcid 20 mg IV BID instead of PPI Follow-up pending biopsies Continue Flagyl for Cdiff  Remain on clears this morning and lunch, likely increase later today   LOS: 4 days    06/25/2012, 7:54 AM    Patient looks pretty good this morning when seen. She denies some diarrhea/melena. Agree with advancing diet. Will review biopsies when they become available.

## 2012-06-25 NOTE — Progress Notes (Signed)
SUBJECTIVE:Feeling better, still mild confusion  Active Problems:   Syncope   Essential hypertension, benign   CVA (cerebral infarction)   GERD (gastroesophageal reflux disease)   Noninfectious gastroenteritis and colitis   Bradycardia   LABS: Basic Metabolic Panel:  Recent Labs  09/81/19 0526 06/25/12 0608  NA 137 138  K 3.3* 4.5  CL 108 111  CO2 17* 19  GLUCOSE 121* 106*  BUN 56* 47*  CREATININE 2.06* 1.81*  CALCIUM 7.8* 8.4   CBC:  Recent Labs  06/24/12 0526 06/25/12 0608  WBC 8.6 9.4  HGB 7.9* 10.5*  HCT 25.2* 32.8*  MCV 76.8* 78.5  PLT 200 198      PHYSICAL EXAM BP 129/66  Pulse 76  Temp(Src) 97.9 F (36.6 C) (Oral)  Resp 18  Ht 5\' 9"  (1.753 m)  Wt 179 lb 7.3 oz (81.4 kg)  BMI 26.49 kg/m2  SpO2 100% General: Well developed, well nourished, in no acute distress Head: Eyes PERRLA, No xanthomas.   Normal cephalic and atramatic  Lungs: Clear bilaterally to auscultation and percussion. Heart: HRIR S1 S2, No MRG .  Pulses are 2+ & equal.            No carotid bruit. No JVD.  No abdominal bruits. No femoral bruits. Abdomen: Bowel sounds are positive, abdomen soft and non-tender without masses or                  Hernia's noted. Msk:  Back normal,  Normal strength and tone for age. Extremities: No clubbing, cyanosis or edema.  DP +1 Neuro: Alert and oriented X 3. Psych:  Good affect, responds appropriately. Mild confusion  TELEMETRY: Reviewed telemetry pt in Atrial fibrillation rates in the  70-90's  ASSESSMENT AND PLAN:  1. Sick Sinus Syndrome: This is found transiently 2 days ago. Appears to be Mobitz 2 heart block. Metroprolol and cardizem was discontinued. She subsequently returned to normal sinus rhythm, but is now in atrial fibrillation overnight with rate controlled . Review of telemetry does not show recurrence of bradycardia or heart block  She is not a coumadin candidate with GI bleed requiring blood transfusion.  2. CAD: Most cardiac  catheterization that I can find record of this in 2001. She was found to have a totally occluded right coronary artery at the time with collaterals to the distal vessel. She did have some non-obstructive disease of her LAD and 30% proximally and 40% distally. Continue aspirin.   3. Hypertension: Blood pressure has improved from hypotension after removal of metoprolol and Cardizem.  She is not on diuretics during this admission. Continue hydration. Continue losartan and clonidine.  4. 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 It also stated she had atherosclerotic type changes with abrupt cut off of the proximal left middle cerebral artery branch consistent with acute infarct.   5.Gastritis: Symptoms have improved without recurrence of watery diarrhea or nausea. Found to have C-Diff. EGD for GI bleed revealed esophageal errosions. Required 1 unit of PRBC's. Hgb increased from 7.9 to 10.1.  6. Hypercholesterolemia   7.Acute Renal Failure: Creatinine increase substantially overnight from 1.03-2.06. She continues on IV hydration with normal saline at 75 cc an hour. She is not on diuretics. Probably related to GI bleed. Creatinine now improved to 1.81.  8. Hypokalemia: Improved to 4.5 this am.      Bettey Mare. Lyman Bishop NP [facility]  06/25/2012, 8:27 AM  Patient examined and agree. Continue telemetry to monitor for any further bradycardia or heart block.  Valera Castle, MD 06/25/2012 9:04 AM

## 2012-06-26 ENCOUNTER — Encounter (HOSPITAL_COMMUNITY): Payer: Self-pay | Admitting: Internal Medicine

## 2012-06-26 LAB — BASIC METABOLIC PANEL
BUN: 27 mg/dL — ABNORMAL HIGH (ref 6–23)
Creatinine, Ser: 1.19 mg/dL — ABNORMAL HIGH (ref 0.50–1.10)
GFR calc non Af Amer: 38 mL/min — ABNORMAL LOW (ref >90)
Glucose, Bld: 96 mg/dL (ref 70–99)
Potassium: 4.9 mEq/L (ref 3.5–5.1)

## 2012-06-26 LAB — CBC WITH DIFFERENTIAL/PLATELET
Basophils Absolute: 0 10*3/uL (ref 0.0–0.1)
Lymphocytes Relative: 18 % (ref 12–46)
Lymphs Abs: 1.3 10*3/uL (ref 0.7–4.0)
Neutro Abs: 4.8 10*3/uL (ref 1.7–7.7)
Platelets: 193 10*3/uL (ref 150–400)
RBC: 3.97 MIL/uL (ref 3.87–5.11)
RDW: 18.2 % — ABNORMAL HIGH (ref 11.5–15.5)
WBC: 7.1 10*3/uL (ref 4.0–10.5)

## 2012-06-26 LAB — MAGNESIUM: Magnesium: 1.7 mg/dL (ref 1.5–2.5)

## 2012-06-26 LAB — STOOL CULTURE

## 2012-06-26 NOTE — Progress Notes (Signed)
Subjective: She says she feels better. She has no new complaints. She is still having some symptoms of UTI. She says she wants to go home.  Objective: Vital signs in last 24 hours: Temp:  [97.9 F (36.6 C)-98.7 F (37.1 C)] 98.7 F (37.1 C) (03/20 1610) Pulse Rate:  [82-121] 92 (03/20 0850) Resp:  [20] 20 (03/20 0608) BP: (107-135)/(53-80) 135/80 mmHg (03/20 0850) SpO2:  [95 %-99 %] 95 % (03/20 0850) Weight change:  Last BM Date: 06/26/12  Intake/Output from previous day: 03/19 0701 - 03/20 0700 In: 2266.7 [P.O.:540; I.V.:1576.7; IV Piggyback:150] Out: 900 [Urine:500; Stool:400]  PHYSICAL EXAM General appearance: alert, cooperative and mild distress Resp: clear to auscultation bilaterally Cardio: regular rate and rhythm, S1, S2 normal, no murmur, click, rub or gallop GI: soft, non-tender; bowel sounds normal; no masses,  no organomegaly Extremities: extremities normal, atraumatic, no cyanosis or edema  Lab Results:    Basic Metabolic Panel:  Recent Labs  96/04/54 0608 06/26/12 0545  NA 138 139  K 4.5 4.9  CL 111 113*  CO2 19 18*  GLUCOSE 106* 96  BUN 47* 27*  CREATININE 1.81* 1.19*  CALCIUM 8.4 8.4  MG  --  1.7   Liver Function Tests: No results found for this basename: AST, ALT, ALKPHOS, BILITOT, PROT, ALBUMIN,  in the last 72 hours No results found for this basename: LIPASE, AMYLASE,  in the last 72 hours No results found for this basename: AMMONIA,  in the last 72 hours CBC:  Recent Labs  06/25/12 0608 06/26/12 0545  WBC 9.4 7.1  NEUTROABS  --  4.8  HGB 10.5* 10.2*  HCT 32.8* 31.4*  MCV 78.5 79.1  PLT 198 193   Cardiac Enzymes: No results found for this basename: CKTOTAL, CKMB, CKMBINDEX, TROPONINI,  in the last 72 hours BNP: No results found for this basename: PROBNP,  in the last 72 hours D-Dimer: No results found for this basename: DDIMER,  in the last 72 hours CBG: No results found for this basename: GLUCAP,  in the last 72  hours Hemoglobin A1C: No results found for this basename: HGBA1C,  in the last 72 hours Fasting Lipid Panel: No results found for this basename: CHOL, HDL, LDLCALC, TRIG, CHOLHDL, LDLDIRECT,  in the last 72 hours Thyroid Function Tests: No results found for this basename: TSH, T4TOTAL, FREET4, T3FREE, THYROIDAB,  in the last 72 hours Anemia Panel: No results found for this basename: VITAMINB12, FOLATE, FERRITIN, TIBC, IRON, RETICCTPCT,  in the last 72 hours Coagulation: No results found for this basename: LABPROT, INR,  in the last 72 hours Urine Drug Screen: Drugs of Abuse  No results found for this basename: labopia, cocainscrnur, labbenz, amphetmu, thcu, labbarb    Alcohol Level: No results found for this basename: ETH,  in the last 72 hours Urinalysis:  Recent Labs  06/25/12 2018  LABSPEC 1.015  PHURINE 5.5  GLUCOSEU NEGATIVE  HGBUR LARGE*  BILIRUBINUR NEGATIVE  KETONESUR NEGATIVE  PROTEINUR NEGATIVE  UROBILINOGEN 0.2  NITRITE POSITIVE*  LEUKOCYTESUR MODERATE*   Misc. Labs:  ABGS No results found for this basename: PHART, PCO2, PO2ART, TCO2, HCO3,  in the last 72 hours CULTURES Recent Results (from the past 240 hour(s))  CLOSTRIDIUM DIFFICILE BY PCR     Status: Abnormal   Collection Time    06/21/12  6:17 PM      Result Value Range Status   C difficile by pcr POSITIVE (*) NEGATIVE Final   Comment: RESULT CALLED TO, READ BACK  BY AND VERIFIED WITH:      ROGERS,A ON 03`514 BY SMITH,N AT 1920  STOOL CULTURE     Status: None   Collection Time    06/21/12  6:18 PM      Result Value Range Status   Specimen Description STOOL   Final   Special Requests NONE   Final   Culture     Final   Value: NO SALMONELLA, SHIGELLA, CAMPYLOBACTER, YERSINIA, OR E.COLI 0157:H7 ISOLATED   Report Status 06/26/2012 FINAL   Final  KOH PREP     Status: None   Collection Time    06/24/12 12:55 PM      Result Value Range Status   Specimen Description ESOPHAGUS BRUSHING   Final    Special Requests NONE   Final   KOH Prep NO YEAST OR FUNGAL ELEMENTS SEEN   Final   Report Status 06/24/2012 FINAL   Final   Studies/Results: No results found.  Medications:  Prior to Admission:  Prescriptions prior to admission  Medication Sig Dispense Refill  . aspirin EC 81 MG tablet Take 81 mg by mouth daily.      . cloNIDine (CATAPRES) 0.1 MG tablet Take 0.1 mg by mouth 2 (two) times daily.      Marland Kitchen diltiazem (DILACOR XR) 240 MG 24 hr capsule Take 240 mg by mouth daily.      . fish oil-omega-3 fatty acids 1000 MG capsule Take 1 g by mouth daily.      . furosemide (LASIX) 20 MG tablet Take 20 mg by mouth daily.      . isosorbide mononitrate (IMDUR) 60 MG 24 hr tablet Take 30 mg by mouth 2 (two) times daily.      Marland Kitchen losartan (COZAAR) 100 MG tablet Take 100 mg by mouth daily.      . metoprolol (LOPRESSOR) 100 MG tablet Take 100 mg by mouth 2 (two) times daily.      . Multiple Vitamin (MULTIVITAMIN WITH MINERALS) TABS Take 1 tablet by mouth daily.      Marland Kitchen omeprazole (PRILOSEC) 20 MG capsule Take 20 mg by mouth daily.      . simvastatin (ZOCOR) 10 MG tablet Take 10 mg by mouth at bedtime.       Scheduled: . amLODipine  5 mg Oral Daily  . ciprofloxacin  250 mg Oral BID  . cloNIDine  0.1 mg Oral BID  . famotidine (PEPCID) IV  20 mg Intravenous Q12H  . losartan  100 mg Oral Daily  . metroNIDAZOLE  500 mg Oral Q8H  . simvastatin  10 mg Oral QHS  . sucralfate  1 g Oral TID WC & HS   Continuous: . 0.9 % NaCl with KCl 20 mEq / L 50 mL/hr at 06/25/12 1421   ZOX:[id number], acetaminophen, alum & mag hydroxide-simeth, ondansetron (ZOFRAN) IV, ondansetron  Assesment: She has severe gastroesophageal reflux disease. She has colitis and is still putting out significant amount of rectal tube output. She now has a UTI and I have started her on antibiotics and we'll try to stop that very quickly because of her C. difficile she had cardiac arrhythmias it seemed okay. She had acute renal failure  that's better Active Problems:   Syncope   Essential hypertension, benign   CVA (cerebral infarction)   GERD (gastroesophageal reflux disease)   Noninfectious gastroenteritis and colitis   Bradycardia    Plan: Continue current treatments    LOS: 5 days   Shaida Route L 06/26/2012, 9:05 AM

## 2012-06-26 NOTE — Progress Notes (Signed)
Patient went into a 21 beat run of v tach and a 47 beat run of V tach. Patient had no symptoms, she was laying in the bed and could tell me her name and where she was. Blood pressure was 116/59 and oxygen stat was 97% on room air. Dr. Juanetta Gosling was called and new orders were given to add a magnesium to lab this morning and that potassium would be looked at this morning as well. Will continue to monitor patient.

## 2012-06-26 NOTE — Progress Notes (Signed)
Physical Therapy Treatment Patient Details Name: [name] MRN: [id number] DOB: [birth year] Today's Date: 06/26/2012 Time: 1119-1208 PT Time Calculation (min): 49 min  PT Assessment / Plan / Recommendation Comments on Treatment Session  Pt with no c/o today, feels stronger.  Pt needed only min assist to mobilize OOB and SBA to stand to walker.  We instructed in sit to stand and bed to chair numerous times while getting her cleaned from lying in a wet bed (she is incontinent of urine).  While sitting on BSC, her flexiseal came out (RN alerted).  She was instructed in gait with a walker, able to ambulate 40' with SBA.  She fatigues easily but is amazingly strong for her age and the illness she has endured.  She plans to stay with her daughter at d/c and I concur that HHPT should be initiated at d/c.    Follow Up Recommendations        Does the patient have the potential to tolerate intense rehabilitation     Barriers to Discharge        Equipment Recommendations       Recommendations for Other Services    Frequency     Plan Discharge plan remains appropriate;Frequency remains appropriate    Precautions / Restrictions Restrictions Weight Bearing Restrictions: No   Pertinent Vitals/Pain     Mobility  Bed Mobility Right Sidelying to Sit: 4: Min assist Transfers Sit to Stand: 5: Supervision;From bed;From chair/3-in-1 Stand to Sit: 5: Supervision Ambulation/Gait Ambulation/Gait Assistance: 5: Supervision Ambulation Distance (Feet): 40 Feet Assistive device: Rolling walker Gait Pattern: Trunk flexed Gait velocity: slow but stable Stairs: No Wheelchair Mobility Wheelchair Mobility: No    Exercises     PT Diagnosis:    PT Problem List:   PT Treatment Interventions:     PT Goals Acute Rehab PT Goals PT Goal: Supine/Side to Sit - Progress: Met PT Goal: Sit to Stand - Progress: Progressing toward goal PT Goal: Stand to Sit - Progress: Progressing toward goal PT Goal:  Ambulate - Progress: Progressing toward goal  Visit Information  Last PT Received On: 07/06/12    Subjective Data  Subjective: feels better Patient Stated Goal: plans to stay with her daughter   Cognition  Cognition Overall Cognitive Status: Appears within functional limits for tasks assessed/performed Arousal/Alertness: Awake/alert Orientation Level: Appears intact for tasks assessed Behavior During Session: [facility] for tasks performed    Balance     End of Session PT - End of Session Equipment Utilized During Treatment: Gait belt Activity Tolerance: Patient tolerated treatment well Patient left: in chair;with call bell/phone within reach Nurse Communication: Mobility status   GP     [name] L 06/26/2012, 12:16 PM

## 2012-06-26 NOTE — Progress Notes (Signed)
SUBJECTIVE:No complaints of dizziness or discomfort.  Active Problems:   Syncope   Essential hypertension, benign   CVA (cerebral infarction)   GERD (gastroesophageal reflux disease)   Noninfectious gastroenteritis and colitis   Bradycardia   LABS: Basic Metabolic Panel:  Recent Labs  09/81/19 0608 06/26/12 0545  NA 138 139  K 4.5 4.9  CL 111 113*  CO2 19 18*  GLUCOSE 106* 96  BUN 47* 27*  CREATININE 1.81* 1.19*  CALCIUM 8.4 8.4  MG  --  1.7   CBC:  Recent Labs  06/25/12 0608 06/26/12 0545  WBC 9.4 7.1  NEUTROABS  --  4.8  HGB 10.5* 10.2*  HCT 32.8* 31.4*  MCV 78.5 79.1  PLT 198 193   PHYSICAL EXAM BP 107/66  Pulse 86  Temp(Src) 98.7 F (37.1 C) (Oral)  Resp 20  Ht 5\' 9"  (1.753 m)  Wt 179 lb 7.3 oz (81.4 kg)  BMI 26.49 kg/m2  SpO2 98% General: Well developed, well nourished, in no acute distress; mentally brighter-answering questions directly and appropriately Head: Eyes PERRLA, No xanthomas.   Normal cephalic and atramatic  Lungs: Clear bilaterally to auscultation diminished in the bases. Heart: HRIR S1 S2, No MRG .  Pulses are 2+ & equal.            No carotid bruit. No JVD.  No abdominal bruits. No femoral bruits. Abdomen: Bowel sounds are positive, hyperactive, abdomen soft and non-tender without masses or                  Hernia's noted. Msk:  Back normal. Diminished strength and tone for age. Extremities: No clubbing, cyanosis or edema.  DP +1 Neuro: Alert and oriented X 3. Psych:  Good affect, responds appropriately, mild confusion  TELEMETRY: Reviewed telemetry pt in: Atrial fibrillation with rates in the 80's- 110 bpm.  ASSESSMENT AND PLAN:  1. Sick Sinus Syndrome:  Metroprolol and cardizem was discontinued. She is now in atrial fibrillation with an acceptable ventricular rate.  Review of telemetry does not show recurrence of bradycardia or heart block She is not a coumadin candidate with GI bleed requiring blood transfusion.  2. CAD:  Most cardiac catheterization that I can find record of this in 2001. She was found to have a totally occluded right coronary artery at the time with collaterals to the distal vessel. She did have some non-obstructive disease of her LAD and 30% proximally and 40% distally. Continue aspirin. No complaints of chest pain.  3. Hypertension: Blood pressure is low normal with continued losartan and clonidine.  Will follow. Hydration is ongoing.  4. 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 It also stated she had atherosclerotic type changes with abrupt cut off of the proximal left middle cerebral artery branch consistent with acute infarct.   5.Gastritis: Symptoms have improved without recurrence of watery diarrhea or nausea. Found to have C-Diff. EGD for GI bleed revealed esophageal errosions. Required 1 unit of PRBC's. Hgb increased from 7.9 to 10.1.   6. Hypercholesterolemia   7.Acute Renal Failure: Creatinine has improved with hydration from highest reading of 2.06 down to 1.19.  She is not on diuretics.   8. Hypokalemia: Improved to 4.5 this am.  Bettey Mare. Lyman Bishop NP [facility] 06/26/2012, 8:38 AM  Cardiology Attending Patient interviewed and examined. Discussed with Joni Reining, NP.  Above note annotated and modified based upon my  findings.  Patient generally improved. Risk of thromboembolism as the result of atrial fibrillation a substantial, and GI blood loss was no worse than semi-acute.  We will reevaluate possibility of safe anticoagulation and adequacy of control of heart rate when she returns to the office.    [place] Bing, MD 06/27/2012, 10:21 AM

## 2012-06-26 NOTE — Progress Notes (Signed)
Pt's flexi-seal came out earlier today.  Pt has not had BM since.  Pt in ST earlier today while working with PT.  Heart rate has returned to 90s.  Dr. Juanetta Gosling on unit and notified.  No new orders for heart rate at this time.  Stated to continue to monitor.  Gave order to discontinue the flexi-seal (rectal tube).  Orders followed.  Fara Chute, RN.

## 2012-06-26 NOTE — Progress Notes (Signed)
Subjective: No nausea, vomiting, abdominal pain. Wants to go home. Appears 400 ml output from rectal tube in past 24 hours.   Objective: Vital signs in last 24 hours: Temp:  [97.9 F (36.6 C)-98.7 F (37.1 C)] 98.7 F (37.1 C) (03/20 8657) Pulse Rate:  [82-121] 86 (03/20 0608) Resp:  [20] 20 (03/20 0608) BP: (107-116)/(53-66) 107/66 mmHg (03/20 0608) SpO2:  [95 %-99 %] 98 % (03/20 0608) Last BM Date: 06/25/12 General:   Alert and oriented, pleasant Head:  Normocephalic and atraumatic. Eyes:  No icterus, sclera clear. Conjuctiva pink.  Heart:  S1, S2 present, no murmurs noted.  Lungs: Clear to auscultation bilaterally, without wheezing, rales, or rhonchi.  Abdomen:  Bowel sounds present, soft, non-tender, non-distended. No HSM or hernias noted. No rebound or guarding. No masses appreciated  Extremities:  Without clubbing or edema. Neurologic:  Alert and  oriented x4;  grossly normal neurologically. Skin:  Warm and dry, intact without significant lesions.  Psych:  Alert and cooperative. Normal mood and affect.  Intake/Output from previous day: 03/19 0701 - 03/20 0700 In: 2266.7 [P.O.:540; I.V.:1576.7; IV Piggyback:150] Out: 900 [Urine:500; Stool:400] Intake/Output this shift:    Lab Results:  Recent Labs  06/24/12 0526 06/25/12 0608 06/26/12 0545  WBC 8.6 9.4 7.1  HGB 7.9* 10.5* 10.2*  HCT 25.2* 32.8* 31.4*  PLT 200 198 193   BMET  Recent Labs  06/24/12 0526 06/25/12 0608 06/26/12 0545  NA 137 138 139  K 3.3* 4.5 4.9  CL 108 111 113*  CO2 17* 19 18*  GLUCOSE 121* 106* 96  BUN 56* 47* 27*  CREATININE 2.06* 1.81* 1.19*  CALCIUM 7.8* 8.4 8.4   CDIFF POSITIVE  Assessment: 79 year old female with Cdiff, IDA, heme positive stools s/p EGD 3/18 showing significant esophagitis but negative KOH. Biopsies of duodenal / gastric mucosa pending. Melena and anemia may be secondary to significant inflammation of esophagus (precipitated by lovenox and ongoing asa therapy).  Appears may be dealing with a UTI, with Cipro ordered by attending overnight. Would avoid Cipro due to presence of Cdiff. May need to talk to ID to discuss best route of treatment  Plan: Continue Flagyl Pepcid BID Carafate suspension QID Follow-up pending biopsies Confer with Dr. Jena Gauss possible alternatives for UTI due to presence of Cdiff (may need to discuss with ID)   LOS: 5 days    06/26/2012, 7:55 AM   Reviewed pathology with Dr. Luisa Hart. Patient has nonspecific duodenitis and gastritis. No evidence of sprue, H. pylori or malignancy. Patient has severe esophagitis, again, nonspecific. No infectious agents seen. This is felt to represent more pill-induced injury rather than anything else. Recommend continuing Pepcid and Carafate. Swallowing precautions reviewed.  Complete treatment for Clostridium difficile infection   ADDENDUM: Spoke with Shon Hale, RN taking care of Ms. Linan regarding swallowing precautions, pill administration to include sitting upright at least 30 minutes after medications.

## 2012-06-27 DIAGNOSIS — I4891 Unspecified atrial fibrillation: Secondary | ICD-10-CM

## 2012-06-27 DIAGNOSIS — K2970 Gastritis, unspecified, without bleeding: Secondary | ICD-10-CM

## 2012-06-27 DIAGNOSIS — K2100 Gastro-esophageal reflux disease with esophagitis, without bleeding: Secondary | ICD-10-CM

## 2012-06-27 DIAGNOSIS — A0472 Enterocolitis due to Clostridium difficile, not specified as recurrent: Secondary | ICD-10-CM

## 2012-06-27 DIAGNOSIS — K2980 Duodenitis without bleeding: Secondary | ICD-10-CM

## 2012-06-27 DIAGNOSIS — K2990 Gastroduodenitis, unspecified, without bleeding: Secondary | ICD-10-CM

## 2012-06-27 LAB — URINE CULTURE: Colony Count: >=100000

## 2012-06-27 MED ORDER — METRONIDAZOLE 500 MG PO TABS: 500.0000 mg | ORAL_TABLET | Freq: Three times a day (TID) | ORAL | Status: DC

## 2012-06-27 MED ORDER — CIPROFLOXACIN HCL 250 MG PO TABS: 250.0000 mg | ORAL_TABLET | Freq: Two times a day (BID) | ORAL | Status: DC

## 2012-06-27 MED ORDER — METOPROLOL TARTRATE 25 MG PO TABS: 12.5000 mg | ORAL_TABLET | Freq: Two times a day (BID) | ORAL | Status: DC

## 2012-06-27 MED ORDER — SUCRALFATE 1 GM/10ML PO SUSP: 1.0000 g | Freq: Three times a day (TID) | ORAL | Status: DC

## 2012-06-27 MED ORDER — AMLODIPINE BESYLATE 5 MG PO TABS: 5.0000 mg | ORAL_TABLET | Freq: Every day | ORAL | Status: DC

## 2012-06-27 NOTE — Progress Notes (Addendum)
Pt is prepared to be discharged.  Her discharge instructions were given to her family member this morning by Johny Chess, LPN.  She states she has no new questions at this time.  The patient has had 2 meals according to the pts family and has had no difficulties, so she says she is ready to go home.  The pt was assisted in getting dressed and is currently ready to go home.  This information was passed o to Dr. Juanetta Gosling and he agrees that she can go ahead and go home.    Pt left the floor with staff in stable condition.

## 2012-06-27 NOTE — Progress Notes (Signed)
Path faxed to PCP 

## 2012-06-27 NOTE — Progress Notes (Signed)
Pts daughter concerned about pt having diarrhea after eating lunch. CM called Dr. Sudie Bailey (on call MD) and he stated for pt to stay for dinner and see how well she is doing and then discharge home.

## 2012-06-27 NOTE — Progress Notes (Signed)
D/c instructions given and verbalizes understanding. Prescriptions faxed over to pharmacy. Awaiting daughter to return for transport home. IV cath intact. No swelling or pain at the site.

## 2012-06-27 NOTE — Progress Notes (Signed)
Subjective: Pt feels much better.  No diarrhea today.  Denies abdominal pain, nausea or vomiting.    Objective: Vital signs in last 24 hours: Temp:  [97.9 F (36.6 C)-98.1 F (36.7 C)] 98.1 F (36.7 C) (03/21 0504) Pulse Rate:  [92-108] 108 (03/21 0504) Resp:  [20] 20 (03/21 0504) BP: (117-136)/(66-80) 117/66 mmHg (03/21 0504) SpO2:  [95 %-98 %] 97 % (03/21 0504) Weight:  [182 lb 1.6 oz (82.6 kg)] 182 lb 1.6 oz (82.6 kg) (03/21 0504) Last BM Date: 06/26/12 Physical Exam: General:   Alert, pleasant and cooperative in NAD Eyes:  Sclera clear, no icterus.   Conjunctiva pink. Mouth:  Oropharynx pink & moist. Heart:  Regular rate and rhythm Abdomen:   Normal bowel sounds.  Soft, nontender and nondistended.  No guarding or rebound tenderness.   Msk:  Symmetrical without gross deformities. Pulses:  Normal pulses noted. Extremities:  Without  edema. Neurologic:  Alert and  oriented x4;  grossly normal neurologically. Skin:  Intact without significant lesions or rashes. Psych:  Alert and cooperative. Normal mood and affect.  Intake/Output from previous day: 03/20 0701 - 03/21 0700 In: 420 [P.O.:420] Out: -  Intake/Output this shift:    Lab Results:  Recent Labs  06/25/12 0608 06/26/12 0545  WBC 9.4 7.1  HGB 10.5* 10.2*  HCT 32.8* 31.4*  PLT 198 193   BMET  Recent Labs  06/25/12 0608 06/26/12 0545  NA 138 139  K 4.5 4.9  CL 111 113*  CO2 19 18*  GLUCOSE 106* 96  BUN 47* 27*  CREATININE 1.81* 1.19*  CALCIUM 8.4 8.4   CDIFF POSITIVE  Assessment: 1. C diff colitis: Improving on flagyl 2. Severe erosive esophagitis/gastritis/duodenitis:  Noted on EGD. 3. IDA:  Hgb stable.   Plan: 1. Continue Flagyl 14 days total after completion of antibiotic. 2. Pepcid BID 3. Carafate suspension QID   LOS: 6 days    06/27/2012, 8:03 AM

## 2012-06-27 NOTE — Progress Notes (Signed)
She feels well and wants to go home. She has been having some tachycardia and she had been taken off her cardiac meds and I agree that she's going to need low-dose beta blocker and then close followup to be sure that she doesn't develop bradycardia again. I think she is okay for discharge She is awake and alert and looks very comfortable. She is afebrile. Her pulse 108 blood pressure 117/66 respirations 16 her chest is clear her abdomen is soft without masses  She is improved and ready for discharge

## 2012-06-29 NOTE — Discharge Summary (Signed)
Physician Discharge Summary  Patient ID: [name] MRN: [id number] DOB/AGE: 06-01-1917 79 y.o. Primary Care Physician:FAGAN,ROY, MD Admit date: 06/21/2012 Discharge date: 06/29/2012    Discharge Diagnoses:   Active Problems:   Syncope   Essential hypertension, benign   CVA (cerebral infarction)   GERD (gastroesophageal reflux disease)   Noninfectious gastroenteritis and colitis   Bradycardia  urinary tract infection C. difficile colitis Acute renal failure Iron deficiency anemia Acute blood loss anemia GI bleeding related to acute esophagitis    Medication List    STOP taking these medications       aspirin EC 81 MG tablet     cloNIDine 0.1 MG tablet  Commonly known as:  CATAPRES     diltiazem 240 MG 24 hr capsule  Commonly known as:  DILACOR XR      TAKE these medications       amLODipine 5 MG tablet  Commonly known as:  NORVASC  Take 1 tablet (5 mg total) by mouth daily.     ciprofloxacin 250 MG tablet  Commonly known as:  CIPRO  Take 1 tablet (250 mg total) by mouth 2 (two) times daily.     fish oil-omega-3 fatty acids 1000 MG capsule  Take 1 g by mouth daily.     furosemide 20 MG tablet  Commonly known as:  LASIX  Take 20 mg by mouth daily.     isosorbide mononitrate 60 MG 24 hr tablet  Commonly known as:  IMDUR  Take 30 mg by mouth 2 (two) times daily.     losartan 100 MG tablet  Commonly known as:  COZAAR  Take 100 mg by mouth daily.     metoprolol tartrate 25 MG tablet  Commonly known as:  LOPRESSOR  Take 0.5 tablets (12.5 mg total) by mouth 2 (two) times daily.     metroNIDAZOLE 500 MG tablet  Commonly known as:  FLAGYL  Take 1 tablet (500 mg total) by mouth every 8 (eight) hours.     multivitamin with minerals Tabs  Take 1 tablet by mouth daily.     omeprazole 20 MG capsule  Commonly known as:  PRILOSEC  Take 20 mg by mouth daily.     simvastatin 10 MG tablet  Commonly known as:  ZOCOR  Take 10 mg by mouth at bedtime.      sucralfate 1 GM/10ML suspension  Commonly known as:  CARAFATE  Take 10 mLs (1 g total) by mouth 4 (four) times daily -  with meals and at bedtime.        Discharged Condition: Improved    Consults: Gastroenterology cardiology  Significant Diagnostic Studies: Dg Chest Port 1 View  06/21/2012  *RADIOLOGY REPORT*  Clinical Data: Syncope.  PORTABLE CHEST - 1 VIEW  Comparison: 11/17/2009 prior chest radiographs dating back to 11/2003.  Findings: Cardiomegaly again noted. Mild elevation of the left hemidiaphragm with minimal basilar scarring again noted. There is no evidence of focal airspace disease, pulmonary edema, suspicious pulmonary nodule/mass, pleural effusion, or pneumothorax. No acute bony abnormalities are identified.  IMPRESSION: Cardiomegaly without evidence of acute cardiopulmonary disease.   Original Report Authenticated By: Harmon Pier, M.D.     Lab Results: Basic Metabolic Panel: No results found for this basename: NA, K, CL, CO2, GLUCOSE, BUN, CREATININE, CALCIUM, MG, PHOS,  in the last 72 hours Liver Function Tests: No results found for this basename: AST, ALT, ALKPHOS, BILITOT, PROT, ALBUMIN,  in the last 72 hours   CBC: No results  found for this basename: WBC, NEUTROABS, HGB, HCT, MCV, PLT,  in the last 72 hours  Recent Results (from the past 240 hour(s))  CLOSTRIDIUM DIFFICILE BY PCR     Status: Abnormal   Collection Time    06/21/12  6:17 PM      Result Value Range Status   C difficile by pcr POSITIVE (*) NEGATIVE Final   Comment: RESULT CALLED TO, READ BACK BY AND VERIFIED WITH:      ROGERS,A ON 03`514 BY SMITH,N AT 1920  STOOL CULTURE     Status: None   Collection Time    06/21/12  6:18 PM      Result Value Range Status   Specimen Description STOOL   Final   Special Requests NONE   Final   Culture     Final   Value: NO SALMONELLA, SHIGELLA, CAMPYLOBACTER, YERSINIA, OR E.COLI 0157:H7 ISOLATED   Report Status 06/26/2012 FINAL   Final  KOH PREP      Status: None   Collection Time    06/24/12 12:55 PM      Result Value Range Status   Specimen Description ESOPHAGUS BRUSHING   Final   Special Requests NONE   Final   KOH Prep NO YEAST OR FUNGAL ELEMENTS SEEN   Final   Report Status 06/24/2012 FINAL   Final  URINE CULTURE     Status: None   Collection Time    06/25/12  8:18 PM      Result Value Range Status   Specimen Description URINE, CLEAN CATCH   Final   Special Requests NONE   Final   Culture  Setup Time 06/25/2012 20:30   Final   Colony Count >=100,000 COLONIES/ML   Final   Culture ESCHERICHIA COLI   Final   Report Status 06/27/2012 FINAL   Final   Organism ID, Bacteria ESCHERICHIA COLI   Final     Hospital Course: [name] is 79 years old and came to the hospital because of episodes of loss of consciousness. She had been having vomiting and diarrhea which initially was thought to be noninfectious. She was found to have significant bradycardia. She had stool for C. difficile that was positive. Her medications for her heart rate were discontinued and she didn't have any further bradycardia but she did have some mild tachycardia. She was treated for C. difficile with Flagyl. She then developed what appeared to be some acute renal failure and acute blood loss anemia. She had GI consultation and was found to have severe esophagitis. She was given 2 units packed red blood cells. She then developed a urinary tract infection and needed to be treated. She had cardiology and gastroenterology consultation  Discharge Exam: Blood pressure 144/76, pulse 62, temperature 98.2 F (36.8 C), temperature source Oral, resp. rate 20, height 5\' 9"  (1.753 m), weight 82.6 kg (182 lb 1.6 oz), SpO2 97.00%. She is awake and alert. She looks comfortable. Her chest is clear. Her heart is regular. Her abdomen is soft  Disposition: Home with home health services. She has a pending urine culture but will go home on short course of Cipro. I will extend the course  of her Flagyl because she's having to take antibiotics for the urinary tract infection      Discharge Orders   Future Appointments Provider Department Dept Phone   07/11/2012 1:00 PM Jodelle Gross, NP [facility] [phone]   Future Orders Complete By Expires     Discharge patient  As directed     Face-to-face encounter (required for Medicare/Medicaid patients)  As directed     Comments:      I Prem Coykendall L certify that this patient is under my care and that I, or a nurse practitioner or physician's assistant working with me, had a face-to-face encounter that meets the physician face-to-face encounter requirements with this patient on 06/27/2012. The encounter with the patient was in whole, or in part for the following medical condition(s) which is the primary reason for home health care (List medical condition): Syncope/GI bleeding/C. difficile colitis/bradycardia    Questions:      The encounter with the patient was in whole, or in part, for the following medical condition, which is the primary reason for home health care:  Syncope/GI bleeding/C. difficile colitis/bradycardia    I certify that, based on my findings, the following services are medically necessary home health services:  Nursing    My clinical findings support the need for the above services:  Unsafe ambulation due to balance issues    Further, I certify that my clinical findings support that this patient is homebound due to:  Ambulates short distances less than 300 feet    Reason for Medically Necessary Home Health Services:  Skilled Nursing- Changes in Medication/Medication Management    Home Health  As directed     Scheduling Instructions:      She needs close monitoring of her heart rate. She will need CBC and basic metabolic profile on 06/30/2012 and please send this to Dr. Ouida Sills    Questions:      To provide the following care/treatments:  RN       Follow-up Information   Follow up with  Joni Reining, NP On 07/11/2012. (1pm)    Contact information:   [facility] [address] [place] [address] [phone]       Follow up with [facility].   Contact information:   [address] [place] [address] [phone]      Signed: Fredirick Maudlin Pager [phone]  06/29/2012, 11:03 AM

## 2012-07-03 ENCOUNTER — Inpatient Hospital Stay (HOSPITAL_COMMUNITY)
Admission: EM | Admit: 2012-07-03 | Discharge: 2012-07-08 | DRG: 309 | Disposition: A | Discharge status: Home Health | Payer: Medicare Other | Attending: Internal Medicine | Admitting: Internal Medicine

## 2012-07-03 ENCOUNTER — Encounter (HOSPITAL_COMMUNITY): Payer: Self-pay

## 2012-07-03 DIAGNOSIS — E7800 Pure hypercholesterolemia, unspecified: Secondary | ICD-10-CM | POA: Diagnosis present

## 2012-07-03 DIAGNOSIS — I10 Essential (primary) hypertension: Secondary | ICD-10-CM

## 2012-07-03 DIAGNOSIS — A0472 Enterocolitis due to Clostridium difficile, not specified as recurrent: Secondary | ICD-10-CM

## 2012-07-03 DIAGNOSIS — D649 Anemia, unspecified: Secondary | ICD-10-CM | POA: Diagnosis present

## 2012-07-03 DIAGNOSIS — N289 Disorder of kidney and ureter, unspecified: Secondary | ICD-10-CM | POA: Diagnosis present

## 2012-07-03 DIAGNOSIS — K208 Other esophagitis without bleeding: Secondary | ICD-10-CM | POA: Diagnosis present

## 2012-07-03 DIAGNOSIS — T368X5A Adverse effect of other systemic antibiotics, initial encounter: Secondary | ICD-10-CM | POA: Diagnosis present

## 2012-07-03 DIAGNOSIS — I509 Heart failure, unspecified: Secondary | ICD-10-CM | POA: Diagnosis present

## 2012-07-03 DIAGNOSIS — D72829 Elevated white blood cell count, unspecified: Secondary | ICD-10-CM | POA: Diagnosis present

## 2012-07-03 DIAGNOSIS — L270 Generalized skin eruption due to drugs and medicaments taken internally: Secondary | ICD-10-CM | POA: Diagnosis present

## 2012-07-03 DIAGNOSIS — E785 Hyperlipidemia, unspecified: Secondary | ICD-10-CM | POA: Diagnosis present

## 2012-07-03 DIAGNOSIS — Z8249 Family history of ischemic heart disease and other diseases of the circulatory system: Secondary | ICD-10-CM

## 2012-07-03 DIAGNOSIS — Z8673 Personal history of transient ischemic attack (TIA), and cerebral infarction without residual deficits: Secondary | ICD-10-CM

## 2012-07-03 DIAGNOSIS — I2510 Atherosclerotic heart disease of native coronary artery without angina pectoris: Secondary | ICD-10-CM | POA: Diagnosis present

## 2012-07-03 DIAGNOSIS — R55 Syncope and collapse: Secondary | ICD-10-CM | POA: Diagnosis present

## 2012-07-03 DIAGNOSIS — E860 Dehydration: Secondary | ICD-10-CM

## 2012-07-03 DIAGNOSIS — I4892 Unspecified atrial flutter: Secondary | ICD-10-CM

## 2012-07-03 DIAGNOSIS — Z809 Family history of malignant neoplasm, unspecified: Secondary | ICD-10-CM

## 2012-07-03 DIAGNOSIS — I4891 Unspecified atrial fibrillation: Principal | ICD-10-CM

## 2012-07-03 DIAGNOSIS — R001 Bradycardia, unspecified: Secondary | ICD-10-CM | POA: Diagnosis present

## 2012-07-03 DIAGNOSIS — R21 Rash and other nonspecific skin eruption: Secondary | ICD-10-CM

## 2012-07-03 DIAGNOSIS — Z96659 Presence of unspecified artificial knee joint: Secondary | ICD-10-CM

## 2012-07-03 DIAGNOSIS — K219 Gastro-esophageal reflux disease without esophagitis: Secondary | ICD-10-CM | POA: Diagnosis present

## 2012-07-03 DIAGNOSIS — Z79899 Other long term (current) drug therapy: Secondary | ICD-10-CM

## 2012-07-03 LAB — COMPREHENSIVE METABOLIC PANEL
Albumin: 2.8 g/dL — ABNORMAL LOW (ref 3.5–5.2)
Alkaline Phosphatase: 81 U/L (ref 39–117)
BUN: 26 mg/dL — ABNORMAL HIGH (ref 6–23)
Potassium: 4.4 mEq/L (ref 3.5–5.1)
Sodium: 135 mEq/L (ref 135–145)
Total Protein: 6.2 g/dL (ref 6.0–8.3)

## 2012-07-03 LAB — LACTIC ACID, PLASMA: Lactic Acid, Venous: 1.3 mmol/L (ref 0.5–2.2)

## 2012-07-03 LAB — URINALYSIS, ROUTINE W REFLEX MICROSCOPIC
Bilirubin Urine: NEGATIVE
Leukocytes, UA: NEGATIVE
Nitrite: NEGATIVE
Specific Gravity, Urine: 1.01 (ref 1.005–1.030)
pH: 5.5 (ref 5.0–8.0)

## 2012-07-03 LAB — CBC WITH DIFFERENTIAL/PLATELET
Basophils Absolute: 0 10*3/uL (ref 0.0–0.1)
Basophils Relative: 0 % (ref 0–1)
Eosinophils Absolute: 0.1 10*3/uL (ref 0.0–0.7)
MCH: 25.5 pg — ABNORMAL LOW (ref 26.0–34.0)
MCHC: 32.4 g/dL (ref 30.0–36.0)
Monocytes Relative: 8 % (ref 3–12)
Neutrophils Relative %: 86 % — ABNORMAL HIGH (ref 43–77)
Platelets: 262 10*3/uL (ref 150–400)
RDW: 19.4 % — ABNORMAL HIGH (ref 11.5–15.5)

## 2012-07-03 MED ORDER — SODIUM CHLORIDE 0.9 % IV SOLN
1000.0000 mL | INTRAVENOUS | Status: DC
  Administered 2012-07-03: 1000 mL via INTRAVENOUS

## 2012-07-03 MED ORDER — SODIUM CHLORIDE 0.9 % IV BOLUS (SEPSIS)
500.0000 mL | Freq: Once | INTRAVENOUS | Status: AC
  Administered 2012-07-03: 500 mL via INTRAVENOUS

## 2012-07-03 MED ORDER — ONDANSETRON HCL 4 MG PO TABS: 4.0000 mg | ORAL_TABLET | Freq: Four times a day (QID) | ORAL | Status: DC | PRN

## 2012-07-03 MED ORDER — SUCRALFATE 1 GM/10ML PO SUSP
1.0000 g | Freq: Three times a day (TID) | ORAL | Status: DC
  Administered 2012-07-03 – 2012-07-08 (×18): 1 g via ORAL
  Filled 2012-07-03 (×18): qty 10

## 2012-07-03 MED ORDER — ALUM & MAG HYDROXIDE-SIMETH 200-200-20 MG/5ML PO SUSP: 30.0000 mL | Freq: Four times a day (QID) | ORAL | Status: DC | PRN

## 2012-07-03 MED ORDER — METRONIDAZOLE 500 MG PO TABS
500.0000 mg | ORAL_TABLET | Freq: Three times a day (TID) | ORAL | Status: DC
  Administered 2012-07-03 – 2012-07-08 (×15): 500 mg via ORAL
  Filled 2012-07-03 (×15): qty 1

## 2012-07-03 MED ORDER — ACETAMINOPHEN 325 MG PO TABS
650.0000 mg | ORAL_TABLET | Freq: Four times a day (QID) | ORAL | Status: DC | PRN
  Administered 2012-07-03: 650 mg via ORAL
  Filled 2012-07-03: qty 2

## 2012-07-03 MED ORDER — DIPHENHYDRAMINE HCL 25 MG PO CAPS
25.0000 mg | ORAL_CAPSULE | ORAL | Status: DC | PRN
  Administered 2012-07-03 – 2012-07-05 (×3): 25 mg via ORAL
  Filled 2012-07-03 (×3): qty 1

## 2012-07-03 MED ORDER — DILTIAZEM LOAD VIA INFUSION
10.0000 mg | Freq: Once | INTRAVENOUS | Status: AC
  Administered 2012-07-03: 10 mg via INTRAVENOUS
  Filled 2012-07-03: qty 10

## 2012-07-03 MED ORDER — ACETAMINOPHEN 650 MG RE SUPP: 650.0000 mg | Freq: Four times a day (QID) | RECTAL | Status: DC | PRN

## 2012-07-03 MED ORDER — DILTIAZEM HCL 100 MG IV SOLR
10.0000 mg/h | INTRAVENOUS | Status: DC
  Administered 2012-07-03 (×2): 10 mg/h via INTRAVENOUS
  Administered 2012-07-04: 15 mg/h via INTRAVENOUS
  Filled 2012-07-03: qty 100

## 2012-07-03 MED ORDER — METOPROLOL TARTRATE 1 MG/ML IV SOLN: 5.0000 mg | Freq: Once | INTRAVENOUS | Status: AC

## 2012-07-03 MED ORDER — ENOXAPARIN SODIUM 30 MG/0.3ML ~~LOC~~ SOLN
30.0000 mg | SUBCUTANEOUS | Status: DC
  Administered 2012-07-03 – 2012-07-06 (×4): 30 mg via SUBCUTANEOUS
  Filled 2012-07-03 (×4): qty 0.3

## 2012-07-03 MED ORDER — ONDANSETRON HCL 4 MG/2ML IJ SOLN: 4.0000 mg | Freq: Four times a day (QID) | INTRAMUSCULAR | Status: DC | PRN

## 2012-07-03 MED ORDER — METOPROLOL TARTRATE 1 MG/ML IV SOLN
INTRAVENOUS | Status: AC
  Administered 2012-07-03: 5 mg via INTRAVENOUS
  Filled 2012-07-03: qty 5

## 2012-07-03 MED ORDER — PANTOPRAZOLE SODIUM 40 MG IV SOLR
40.0000 mg | INTRAVENOUS | Status: DC
  Administered 2012-07-03 – 2012-07-04 (×2): 40 mg via INTRAVENOUS
  Filled 2012-07-03 (×2): qty 40

## 2012-07-03 NOTE — ED Notes (Signed)
Pharmacy called to send cardizem infusion.

## 2012-07-03 NOTE — Consult Note (Signed)
Patient ID: [name] MRN: [id number], DOB/AGE: [birth year]   Admit date: 07/03/2012 Date of Consult: @TODAY @  Primary Physician: Carylon Perches, MD Primary Cardiologist: Eden Emms   Problem List: Past Medical History  Diagnosis Date  . Hypertension   . Stroke   . CHF (congestive heart failure)   . GERD (gastroesophageal reflux disease)   . High cholesterol   . PONV (postoperative nausea and vomiting)     Past Surgical History  Procedure Laterality Date  . Knee surgery    . Colonoscopy  2006    Dr. Karilyn Cota: ischemic colitis involving descending colon, extensive diverticula  . Esophagogastroduodenoscopy (egd) with esophageal dilation N/A 06/24/2012    Procedure: ESOPHAGOGASTRODUODENOSCOPY (EGD) WITH ESOPHAGEAL DILATION;  Surgeon: Corbin Ade, MD;  Location: [facility];  Service: Endoscopy;  Laterality: N/A;     Allergies: No Known Allergies  HPI:  Patient is a 79 yo who we are asked to see re afib.  The patient was recently discharged from [facility].  She was admitted with earlier in March with syncope   Patient had N/V and diarrhea at time.  During hospitaliztion patient noted to have SR, afib and 1 episode of Type II 2nd degree AV block  Transient.  Her dilt and clonidine were stopped Lopressor was dropped from 100 bid to 12.5 bid.  She was sent home on norvasc for BP control She presented today with rash.  Found to be in rapid afib  IV dilt (10 mg bolus and 10cc/hour started in ER) She denies SOB  No CP  No palpitations.  Occasional dizziness with standing.     Inpatient Medications:  . enoxaparin (LOVENOX) injection  30 mg Subcutaneous Q24H  . metroNIDAZOLE  500 mg Oral Q8H  . pantoprazole (PROTONIX) IV  40 mg Intravenous Q24H  . sucralfate  1 g Oral TID WC & HS    Family History  Problem Relation Age of Onset  . Cancer Mother   . Cancer Sister   . Heart attack Father   . Colon cancer Neg Hx      History   Social History  . Marital Status: Widowed     Spouse Name: N/A    Number of Children: N/A  . Years of Education: N/A   Occupational History  . Not on file.   Social History Main Topics  . Smoking status: Never Smoker   . Smokeless tobacco: Not on file  . Alcohol Use: No  . Drug Use: No  . Sexually Active: Not on file   Other Topics Concern  . Not on file   Social History Narrative  . No narrative on file     Review of Systems: All other systems reviewed and are otherwise negative except as noted above.  Physical Exam: Filed Vitals:   07/03/12 1730  BP: 123/68  Pulse:   Temp:   Resp: 23    Intake/Output Summary (Last 24 hours) at 07/03/12 1815 Last data filed at 07/03/12 1700  Gross per 24 hour  Intake 904.33 ml  Output    200 ml  Net 704.33 ml    General: Well developed, well nourished, in no acute distress. Head: Normocephalic, atraumatic, sclera non-icteric Skin:  Diffuse macular rash thoughout trunk/back Neck: Negative for carotid bruits. JVP not elevated. Lungs: Clear bilaterally to auscultation without wheezes, rales, or rhonchi. Breathing is unlabored. Heart:  Irreg Irreg  with S1 S2. No murmurs, rubs, or gallops appreciated. Abdomen: Soft, non-tender, non-distended with normoactive bowel sounds.  No hepatomegaly. No rebound/guarding. No obvious abdominal masses. Msk:  Strength and tone appears normal for age. Extremities: No clubbing, cyanosis or edema.  Distal pedal pulses are 2+ and equal bilaterally. Neuro: Alert and oriented X 3. Moves all extremities spontaneously. Psych:  Responds to questions appropriately with a normal affect.  Labs: Results for orders placed during the hospital encounter of 07/03/12 (from the past 24 hour(s))  CBC WITH DIFFERENTIAL     Status: Abnormal   Collection Time    07/03/12  9:51 AM      Result Value Range   WBC 15.3 (*) 4.0 - 10.5 K/uL   RBC 4.11  3.87 - 5.11 MIL/uL   Hemoglobin 10.5 (*) 12.0 - 15.0 g/dL   HCT 16.1 (*) 09.6 - 04.5 %   MCV 78.8  78.0 -  100.0 fL   MCH 25.5 (*) 26.0 - 34.0 pg   MCHC 32.4  30.0 - 36.0 g/dL   RDW 40.9 (*) 81.1 - 91.4 %   Platelets 262  150 - 400 K/uL   Neutrophils Relative 86 (*) 43 - 77 %   Neutro Abs 13.1 (*) 1.7 - 7.7 K/uL   Lymphocytes Relative 6 (*) 12 - 46 %   Lymphs Abs 0.9  0.7 - 4.0 K/uL   Monocytes Relative 8  3 - 12 %   Monocytes Absolute 1.2 (*) 0.1 - 1.0 K/uL   Eosinophils Relative 1  0 - 5 %   Eosinophils Absolute 0.1  0.0 - 0.7 K/uL   Basophils Relative 0  0 - 1 %   Basophils Absolute 0.0  0.0 - 0.1 K/uL  COMPREHENSIVE METABOLIC PANEL     Status: Abnormal   Collection Time    07/03/12  9:51 AM      Result Value Range   Sodium 135  135 - 145 mEq/L   Potassium 4.4  3.5 - 5.1 mEq/L   Chloride 105  96 - 112 mEq/L   CO2 21  19 - 32 mEq/L   Glucose, Bld 114 (*) 70 - 99 mg/dL   BUN 26 (*) 6 - 23 mg/dL   Creatinine, Ser 7.82 (*) 0.50 - 1.10 mg/dL   Calcium 8.5  8.4 - 95.6 mg/dL   Total Protein 6.2  6.0 - 8.3 g/dL   Albumin 2.8 (*) 3.5 - 5.2 g/dL   AST 14  0 - 37 U/L   ALT 9  0 - 35 U/L   Alkaline Phosphatase 81  39 - 117 U/L   Total Bilirubin 0.7  0.3 - 1.2 mg/dL   GFR calc non Af Amer 29 (*) >90 mL/min   GFR calc Af Amer 34 (*) >90 mL/min  LACTIC ACID, PLASMA     Status: None   Collection Time    07/03/12  9:51 AM      Result Value Range   Lactic Acid, Venous 1.3  0.5 - 2.2 mmol/L  URINALYSIS, ROUTINE W REFLEX MICROSCOPIC     Status: None   Collection Time    07/03/12 11:37 AM      Result Value Range   Color, Urine YELLOW  YELLOW   APPearance CLEAR  CLEAR   Specific Gravity, Urine 1.010  1.005 - 1.030   pH 5.5  5.0 - 8.0   Glucose, UA NEGATIVE  NEGATIVE mg/dL   Hgb urine dipstick NEGATIVE  NEGATIVE   Bilirubin Urine NEGATIVE  NEGATIVE   Ketones, ur NEGATIVE  NEGATIVE mg/dL   Protein, ur NEGATIVE  NEGATIVE  mg/dL   Urobilinogen, UA 0.2  0.0 - 1.0 mg/dL   Nitrite NEGATIVE  NEGATIVE   Leukocytes, UA NEGATIVE  NEGATIVE  MRSA PCR SCREENING     Status: None   Collection Time     07/03/12  3:10 PM      Result Value Range   MRSA by PCR NEGATIVE  NEGATIVE    Radiology/Studies: Dg Chest Port 1 View  06/21/2012  *RADIOLOGY REPORT*  Clinical Data: Syncope.  PORTABLE CHEST - 1 VIEW  Comparison: 11/17/2009 prior chest radiographs dating back to 11/2003.  Findings: Cardiomegaly again noted. Mild elevation of the left hemidiaphragm with minimal basilar scarring again noted. There is no evidence of focal airspace disease, pulmonary edema, suspicious pulmonary nodule/mass, pleural effusion, or pneumothorax. No acute bony abnormalities are identified.  IMPRESSION: Cardiomegaly without evidence of acute cardiopulmonary disease.   Original Report Authenticated By: Harmon Pier, M.D.     EKG:  Atrial flutter 2:1 conduction.    ASSESSMENT AND PLAN:   Patient is a 79 yo who presents with rapid atrial flutter.  Now appears to be in afib with rates in 110s.  SBP is 110s.  On exam patient with diffuse erythematous rash consistent with drug reaction.  (?cipro vs norvasc >flagyl) At last admit her medicines were signif changed.  She was on 3 negative chronotropes  Now on minimal dose of one.   For now I would recomm continuing dilt IV  Follow HR and BP response.  COntinue low dose lopressor. Treat drug Rx.  This should decrease some of increased sympathetic drive. Patient is not a candidate for anticoagulation. I will review case with EP.   Signed, Dietrich Pates 07/03/2012, 6:15 PM

## 2012-07-03 NOTE — ED Notes (Signed)
Attempted IV x2 without success  

## 2012-07-03 NOTE — ED Notes (Signed)
Pt brought to er today by daughter from home, was just d.c from hosp. Last Friday for d-diff. Started on flagyl, 3/21. Rash to trunk and back for 2 days, denies any resp distress. Is also having low back pain. Daughter stated she has "pus on her kidneys"

## 2012-07-03 NOTE — ED Notes (Signed)
Pt has bilateral pitting edema +1 in her lower extremities. Pt states the rash does not hurt and does not itch. Pt denies pain.

## 2012-07-03 NOTE — ED Notes (Signed)
While in room, patient sitting on side of bed. Patient suddenly started drifting sideways towards the head of bed, eyes rolled back and patient appeared to have a near syncopal episode. Patient never lost consciousness and responded to voice, shaking head yes when asked if she was ok. Patient repositioned to lay down in bed. Side rails up x 2 on stretcher, call bell in reach.   Rash noted to bilateral arms, scaled and hardened to touch. Started on flagyl this past week for C-diff.

## 2012-07-03 NOTE — ED Provider Notes (Signed)
History  This chart was scribed for Flint Melter, MD by Shari Heritage, ED Scribe. The patient was seen in room APA19/APA19. Patient's care was started at 0918.   CSN: [id number]  Arrival date & time 07/03/12  0902   First MD Initiated Contact with Patient 07/03/12 [id number]      Chief Complaint  Patient presents with  . Allergic Reaction     The history is provided by the patient. No language interpreter was used.    HPI Comments: [name] is a 79 y.o. female with history of c diff who presents to the Emergency Department complaining of an erythematous rash to the thighs, torso and back onset yesterday evening. Daughter states that rash began last night on her legs and spread to back, abdomen and chest. Daughter thinks that rash is related to new medication. Daughter says that patient complains of itchiness, but has not been scratching rash areas. Patient is also complaining of constant, non-radiating lower back pain since yesterday. Patient has had decreased appetite since being discharged from the hospital on 06/27/2012 where she was being treated for c diff. Patient is also having diarrhea and was started on Flagyl in the hospital. Patient finished Cipro yesterday that was prescribed at hospital discharge. Patient has an appointment with Dr. Ouida Sills tomorrow - office was unable to see patient this morning. Her other medical history includes hypertension, stroke, congestive heart failure, GERD and high cholesterol.    Past Medical History  Diagnosis Date  . Hypertension   . Stroke   . CHF (congestive heart failure)   . GERD (gastroesophageal reflux disease)   . High cholesterol   . PONV (postoperative nausea and vomiting)     Past Surgical History  Procedure Laterality Date  . Knee surgery    . Colonoscopy  2006    Dr. Karilyn Cota: ischemic colitis involving descending colon, extensive diverticula  . Esophagogastroduodenoscopy (egd) with esophageal dilation N/A 06/24/2012     Procedure: ESOPHAGOGASTRODUODENOSCOPY (EGD) WITH ESOPHAGEAL DILATION;  Surgeon: Corbin Ade, MD;  Location: [facility];  Service: Endoscopy;  Laterality: N/A;    Family History  Problem Relation Age of Onset  . Cancer Mother   . Cancer Sister   . Heart attack Father   . Colon cancer Neg Hx     History  Substance Use Topics  . Smoking status: Never Smoker   . Smokeless tobacco: Not on file  . Alcohol Use: No    OB History   Grav Para Term Preterm Abortions TAB SAB Ect Mult Living                  Review of Systems A complete 10 system review of systems was obtained and all systems are negative except as noted in the HPI and PMH.   Allergies  Review of patient's allergies indicates no known allergies.  Home Medications   No current outpatient prescriptions on file.  BP 111/66  Pulse 139  Temp(Src) 98.3 F (36.8 C) (Oral)  Resp 16  SpO2 95%  Physical Exam  Nursing note and vitals reviewed. Constitutional: She is oriented to person, place, and time. She appears well-developed and well-nourished.  HENT:  Head: Normocephalic and atraumatic.  Right Ear: External ear normal.  Left Ear: External ear normal.  Mouth/Throat: Oropharynx is clear and moist and mucous membranes are normal. Mucous membranes are not dry.  Eyes: Conjunctivae and EOM are normal. Pupils are equal, round, and reactive to light. No scleral icterus.  Neck: Normal range of motion and phonation normal. Neck supple.  Cardiovascular: Regular rhythm, normal heart sounds and intact distal pulses.  Tachycardia present.   Pulmonary/Chest: Effort normal and breath sounds normal. She has no decreased breath sounds. She has no wheezes. She has no rhonchi. She has no rales. She exhibits no bony tenderness.  Abdominal: Soft. Normal appearance. There is no tenderness.  Musculoskeletal: Normal range of motion.  Neurological: She is alert and oriented to person, place, and time. She has normal strength. No  cranial nerve deficit or sensory deficit. She exhibits normal muscle tone. Coordination normal.  Skin: Skin is warm, dry and intact. Rash noted. No petechiae noted. Rash is not vesicular. There is erythema.  Generalized red, raised, confluent rash primarily on the torso. No associated vesicles or petechiae.   Psychiatric: She has a normal mood and affect. Her behavior is normal. Judgment and thought content normal.    ED Course  Procedures (including critical care time) DIAGNOSTIC STUDIES: Oxygen Saturation is 95% on room air, adequate by my interpretation.    COORDINATION OF CARE: 9:40 AM- Daughter is bedside and has been informed of current plan for treatment and evaluation and agrees with plan at this time.   11:21 AM- Right femoral vein stick under ultrasound guidance to obtain blood for testing.  11:29 AM- Patient given lopressor 5 mg which improved HR from 150 to 120. Spoke with Dr. Ouida Sills who has agreed to admit patient.  Filed Vitals:   07/03/12 1340 07/03/12 1350 07/03/12 1515 07/03/12 1530  BP:   106/80 114/83  Pulse: 137 131    Temp:      TempSrc:      Resp: 18 18 23 18   SpO2: 99% 98% 99%     CRITICAL CARE Performed by: Mancel Bale, MD  Total critical care time: 45 minutes  Critical care time was exclusive of separately billable procedures and treating other patients.  Critical care was necessary to treat or prevent imminent or life-threatening deterioration.  Critical care was time spent personally by me on the following activities: development of treatment plan with patient and/or surrogate as well as nursing, discussions with consultants, evaluation of patient's response to treatment, examination of patient, obtaining history from patient or surrogate, ordering and performing treatments and interventions, ordering and review of laboratory studies, ordering and review of radiographic studies, pulse oximetry and re-evaluation of patient's condition.    Date:  01/25/2012  Rate: 134  Rhythm: atrial flutter  QRS Axis: left  PR and QT Intervals: QT normal  ST/T Wave abnormalities: nonspecific ST/T changes  PR and QRS Conduction Disutrbances:QT normal  Narrative Interpretation:   Old EKG Reviewed: 06/24/38 team. She was in normal sinus rhythm   Medications  ondansetron (ZOFRAN) tablet 4 mg (not administered)    Or  ondansetron (ZOFRAN) injection 4 mg (not administered)  alum & mag hydroxide-simeth (MAALOX/MYLANTA) 200-200-20 MG/5ML suspension 30 mL (not administered)  enoxaparin (LOVENOX) injection 30 mg (30 mg Subcutaneous Given 07/03/12 1338)  acetaminophen (TYLENOL) tablet 650 mg (not administered)    Or  acetaminophen (TYLENOL) suppository 650 mg (not administered)  metroNIDAZOLE (FLAGYL) tablet 500 mg (500 mg Oral Given 07/03/12 1337)  sucralfate (CARAFATE) 1 GM/10ML suspension 1 g (not administered)  diltiazem (CARDIZEM) 1 mg/mL load via infusion 10 mg (10 mg Intravenous New Bag/Given 07/03/12 1448)    And  diltiazem (CARDIZEM) 100 mg in dextrose 5 % 100 mL infusion (15 mg/hr Intravenous Rate/Dose Change 07/03/12 1540)  pantoprazole (PROTONIX) injection 40  mg (40 mg Intravenous Given 07/03/12 1338)  diphenhydrAMINE (BENADRYL) capsule 25 mg (25 mg Oral Given 07/03/12 1546)  0.9 %  sodium chloride infusion (1,000 mLs Intravenous Rate/Dose Change 07/03/12 1535)  sodium chloride 0.9 % bolus 500 mL (0 mLs Intravenous Stopped 07/03/12 1224)  metoprolol (LOPRESSOR) injection 5 mg (5 mg Intravenous Given 07/03/12 1115)    Labs Reviewed  CBC WITH DIFFERENTIAL - Abnormal; Notable for the following:    WBC 15.3 (*)    Hemoglobin 10.5 (*)    HCT 32.4 (*)    MCH 25.5 (*)    RDW 19.4 (*)    Neutrophils Relative 86 (*)    Neutro Abs 13.1 (*)    Lymphocytes Relative 6 (*)    Monocytes Absolute 1.2 (*)    All other components within normal limits  COMPREHENSIVE METABOLIC PANEL - Abnormal; Notable for the following:    Glucose, Bld 114 (*)    BUN 26  (*)    Creatinine, Ser 1.49 (*)    Albumin 2.8 (*)    GFR calc non Af Amer 29 (*)    GFR calc Af Amer 34 (*)    All other components within normal limits  CULTURE, BLOOD (ROUTINE X 2)  URINE CULTURE  MRSA PCR SCREENING  LACTIC ACID, PLASMA  URINALYSIS, ROUTINE W REFLEX MICROSCOPIC    Nursing Notes Reviewed/ Care Coordinated Applicable Imaging Reviewed Interpretation of Laboratory Data incorporated into ED treatment   1. Dehydration   2. Atrial flutter with rapid ventricular response   3. C. difficile diarrhea   4. Drug allergy, antibiotic, initial encounter       MDM  Dehydration with secondary atrial flutter and rapid ventricular response. She has an Acute drug allergy. It is more likely related to Cipro, than Flagyl. Doubt sepsis. Suspect ongoing C. Difficile enterocolitis.  She requires admission, for stabilization.      I personally performed the services described in this documentation, which was scribed in my presence. The recorded information has been reviewed and is accurate.      Flint Melter, MD 07/03/12 [phone]

## 2012-07-04 DIAGNOSIS — I10 Essential (primary) hypertension: Secondary | ICD-10-CM

## 2012-07-04 DIAGNOSIS — I4891 Unspecified atrial fibrillation: Principal | ICD-10-CM

## 2012-07-04 DIAGNOSIS — R21 Rash and other nonspecific skin eruption: Secondary | ICD-10-CM

## 2012-07-04 LAB — URINE CULTURE

## 2012-07-04 MED ORDER — BOOST / RESOURCE BREEZE PO LIQD
1.0000 | Freq: Two times a day (BID) | ORAL | Status: DC
  Administered 2012-07-04 – 2012-07-07 (×6): 1 via ORAL

## 2012-07-04 MED ORDER — PREDNISONE 20 MG PO TABS: 50.0000 mg | ORAL_TABLET | Freq: Once | ORAL | Status: DC

## 2012-07-04 MED ORDER — SODIUM CHLORIDE 0.9 % IV SOLN: 1000.0000 mL | INTRAVENOUS | Status: DC

## 2012-07-04 MED ORDER — METOPROLOL TARTRATE 25 MG PO TABS
25.0000 mg | ORAL_TABLET | Freq: Two times a day (BID) | ORAL | Status: DC
  Administered 2012-07-04 (×2): 25 mg via ORAL
  Filled 2012-07-04 (×3): qty 1

## 2012-07-04 MED ORDER — PREDNISONE 20 MG PO TABS
60.0000 mg | ORAL_TABLET | Freq: Once | ORAL | Status: AC
  Administered 2012-07-04: 60 mg via ORAL
  Filled 2012-07-04: qty 3

## 2012-07-04 MED ORDER — DILTIAZEM HCL 60 MG PO TABS
60.0000 mg | ORAL_TABLET | Freq: Four times a day (QID) | ORAL | Status: DC
Start: 2012-07-04 — End: 2012-07-07
  Administered 2012-07-04 – 2012-07-07 (×12): 60 mg via ORAL
  Filled 2012-07-04 (×12): qty 1

## 2012-07-04 NOTE — Progress Notes (Signed)
SUBJECTIVE: Complains of being cold. Denies chest pain or palpitations.  Active Problems:   Syncope   Essential hypertension, benign   CVA (cerebral infarction)   Bradycardia   Atrial fibrillation   Rash and nonspecific skin eruption   LABS: Basic Metabolic Panel:  Recent Labs  16/10/96 0951  NA 135  K 4.4  CL 105  CO2 21  GLUCOSE 114*  BUN 26*  CREATININE 1.49*  CALCIUM 8.5   Liver Function Tests:  Recent Labs  07/03/12 0951  AST 14  ALT 9  ALKPHOS 81  BILITOT 0.7  PROT 6.2  ALBUMIN 2.8*   CBC:  Recent Labs  07/03/12 0951  WBC 15.3*  NEUTROABS 13.1*  HGB 10.5*  HCT 32.4*  MCV 78.8  PLT 262     RADIOLOGY: No results found.   PHYSICAL EXAM BP 97/43  Pulse 131  Temp(Src) 98 F (36.7 C) (Oral)  Resp 19  Ht 5\' 8"  (1.727 m)  Wt 182 lb 1.6 oz (82.6 kg)  BMI 27.69 kg/m2  SpO2 99% General: Well developed, well nourished, in no acute distress SKin:   Diffuse macular rash. Head: Eyes PERRLA, No xanthomas.   Normal cephalic and atramatic  Lungs: Clear bilaterally to auscultation and percussion. Heart: HRRR S1 S2, No MRG .  Pulses are 2+ & equal.            No carotid bruit. No JVD.  No abdominal bruits. No femoral bruits. Abdomen: Bowel sounds are positive, abdomen soft and non-tender without masses or                  Hernia's noted. Msk:  Back normal, normal gait. Normal strength and tone for age. Extremities: No clubbing, cyanosis or edema. Macular rash noted on trunk and back.  DP +1 Neuro: Alert and oriented X 3. Psych:  Good affect, responds appropriately  TELEMETRY: Reviewed telemetry pt in: Atrial fib with salvos of PVC in triplets and pairs. Rates between 89-112 bpm. Avg now in 80s to 90  ASSESSMENT AND PLAN:  1. Atrial fibrillation with RVR: Patient is now rate controlled on IV Cardizem 50 mg an hour. When the patient was admitted last she had been on multiple AV nodal blockers and diltiazem was discontinued in the setting of Mobitz 2  heart block along with digoxin and metoprolol. Heart rate now in the 80-100 beats per minute. She is having salvos of PVCs, but no bradycardia or pauses. We will transition to by mouth Cardizem from IV as heart rate is well-controlled without evidence of bradycardia. Will begin at 60 mg Q 6 hours, with parameters for HR. BP is soft in the low 100's and high 90's..Still on metoprolol 25 mg BID.   2. Hypertension: Currently low normal on cardizem and metoprolol. Close monitoring of pressures on po medications. Last echo earlier this month demonstrated normal EF with moderate to severe LVH.   3. Rash: Question medication induced. No more complaints it itching. Redness remains on trunk  Would recomm steroids to shorten course of reaction.  Will need gradual taper.  This is putting her in a somewhat overdrive state with vasoldilation.  Will write for initial doses.  Bettey Mare. Lyman Bishop NP [facility] 07/04/2012, 9:53 AM  Patient seen and examined  Agree with findings of K Lawrence above  I have amended comments. ON exam, HR is improved overall.  Lungs CTA  Cardiac Irreg irreg  No S3  Ext no edimea.  Skin Diffuse macular rash.  Will continue to follow.

## 2012-07-04 NOTE — Care Management Note (Signed)
    Page 1 of 2   07/08/2012     10:20:23 AM   CARE MANAGEMENT NOTE 07/08/2012  Patient:  [name], [name]   Account Number:  [id number]  Date Initiated:  07/04/2012  Documentation initiated by:  Rosemary Holms  Subjective/Objective Assessment:   Pt admitted from home after recently going home. Being followed at home by [facility].     Action/Plan:   Anticipated DC Date:  07/09/2012   Anticipated DC Plan:  HOME W HOME HEALTH SERVICES      DC Planning Services  CM consult      Choice offered to / List presented to:          [facility] arranged  HH-1 RN  HH-4 NURSE'S AIDE      HH agency  [facility]   Status of service:  Completed, signed off Medicare Important Message given?  YES (If response is "NO", the following Medicare IM given date fields will be blank) Date Medicare IM given:  07/08/2012 Date Additional Medicare IM given:    Discharge Disposition:  HOME W HOME HEALTH SERVICES  Per UR Regulation:    If discussed at Long Length of Stay Meetings, dates discussed:   07/08/2012    Comments:  07/08/12 1020 [name], RN BSN CM Pt discharged home today with [facility]. [name] of [facility] is aware and will collect the pts information from the chart. No DME needs noted. HH services to start within 48 hours. Pt and pts nurse aware of discharge arrangements.  07/07/12 1035 [name], RN BSN CM PT recommended ALF but pt stated that she is going home. Pt and pts daughter is aware that pt will return home with resumption of [facility] RN and they would like an aide. [name] of [facility] is aware and will collect the pts information from thechart. CM did offer list of private duty sitter agencies for family to arrange private duty CNA but they refuse at this time due to finances. Pt is very close to discharge and they are aware. Pt may need swallow eval but pt stated that she gets strangled if she takes too much at a time by mouth. Pt also stated that she doesn't have much of an appetite due  to not being very active and the fact that she is 79 years old but she does have food in the home. Will continue to follow for discharge needs.  07/04/12 Rosemary Holms RN BSN CM [facility] notified of readmission

## 2012-07-04 NOTE — Progress Notes (Signed)
[name], [name]             ACCOUNT NO.:  [id number]  MEDICAL RECORD NO.:  [id number]  LOCATION:  IC02                          FACILITY:  [facility]  PHYSICIAN:  Kingsley Callander. Ouida Sills, MD       DATE OF BIRTH:  [birth year]  DATE OF PROCEDURE:  07/04/2012 DATE OF DISCHARGE:                                PROGRESS NOTE   SUBJECTIVE:  [name] has had no complications overnight.  She remains in atrial fibrillation.  The computer-assisted pulses range from 42 to 149.  The nursing staff denies having any rates in the 40s.  Her diltiazem was decreased to 5 mg/hour overnight, but had to be increased back to 10 mg/hour because of her heart rate.  She is now on the 100 range in atrial fibrillation both on the monitor and on her EKG.  She is asymptomatic with it.  She denies having had any more diarrhea.  She does not feel nauseated now.  She would like to try a soft diet.  PHYSICAL EXAMINATION:  VITAL SIGNS:  Temperature 98, pulse 100, respirations 20, blood pressure 102/54. GENERAL:  Alert, in no distress. HEENT:  Unremarkable. LUNGS:  Clear. HEART:  Tachycardic and irregularly irregular. ABDOMEN:  Soft and nontender with no palpable organomegaly.  IMPRESSION AND PLAN: 1. Atrial fibrillation with a rapid ventricular response.  Increase     diltiazem to 15 mg/hour.  Increase metoprolol to 25 mg b.i.d.     Appreciate Cardiology consult.  Hopefully, she will convert to     sinus rhythm. 2. C. diff colitis.  Continue metronidazole.  White count yesterday     was 15.3.  She is not having diarrhea now. 3. Recent E. coli urinary tract infection.  Cipro has been     discontinued.  Her urinalysis is negative.  She has a likely drug     rash hopefully related to Cipro and not Flagyl. 4. Anemia.  Hemoglobin 10.5.  Recheck tomorrow. 5. Renal insufficiency, likely related to volume status.  BUN and     creatinine were 26 and 1.49.  Recheck tomorrow. 6. Erosive esophagitis.  Continue pantoprazole and  sucralfate.     Kingsley Callander. Ouida Sills, MD     ROF/MEDQ  D:  07/04/2012  T:  07/04/2012  Job:  [id number]

## 2012-07-04 NOTE — Progress Notes (Signed)
UR Chart Review Completed  

## 2012-07-04 NOTE — Progress Notes (Signed)
INITIAL NUTRITION ASSESSMENT  DOCUMENTATION CODES Per approved criteria  -Not Applicable   INTERVENTION: Resource Breeze po BID, each supplement provides 250 kcal and 9 grams of protein.  NUTRITION DIAGNOSIS: Inadequate oral intake related to altered GI function as evidenced by nausea, vomiting and diarrhea.   Goal: Pt to meet >/= 90% of their estimated nutrition needs  Monitor:  Po intake, labs and wt trends  Reason for Assessment: consult to assess nutrition requirements/status  79 y.o. female  Admitting Dx: Gastroenteritis  ASSESSMENT: Pt presented to ED after losing consciousness. Nausea and vomiting, diarrhea PTA. Pt appears well-nourished. Current wt 104# of admission wt (175#). UBW unknown at this time.  Recent hospitalization from 3/15-3/23/14 multiple medical problems. Recent tx for C. Diff and UTI, GI consult for esophagitis, duodenitis and gastriitis. She received 2 units PRBC and d/c home with home health.  She is at risk for malnutrition given her  acute illness and advanced age. Will continue to follow for nutrition needs and re-assess as indicated.  Height: Ht Readings from Last 1 Encounters:  07/03/12 5\' 8"  (1.727 m)    Weight: Wt Readings from Last 1 Encounters:  07/04/12 182 lb 1.6 oz (82.6 kg)    Ideal Body Weight: 140# (63.6 kg)  % Ideal Body Weight: 130%  Wt Readings from Last 10 Encounters:  07/04/12 182 lb 1.6 oz (82.6 kg)  06/27/12 182 lb 1.6 oz (82.6 kg)  06/27/12 182 lb 1.6 oz (82.6 kg)    Usual Body Weight: pt unable to proivde   BMI:  Body mass index is 27.69 kg/(m^2). Overweight  Estimated Nutritional Needs: Kcal: 1500-1728 Protein: 70-80 gr Fluid: >2000  ml/day  Skin: No issues noted  Diet Order: Dysphagia III with thin liquids  EDUCATION NEEDS: -Education not appropriate at this time   Intake/Output Summary (Last 24 hours) at 07/04/12 1028 Last data filed at 07/04/12 0800  Gross per 24 hour  Intake 1827.99 ml   Output    600 ml  Net 1227.99 ml    Last BM: 07/03/12 diarrhea  Labs:   Recent Labs Lab 07/03/12 0951  NA 135  K 4.4  CL 105  CO2 21  BUN 26*  CREATININE 1.49*  CALCIUM 8.5  GLUCOSE 114*    CBG (last 3)  No results found for this basename: GLUCAP,  in the last 72 hours  Scheduled Meds: . diltiazem  60 mg Oral Q6H  . enoxaparin (LOVENOX) injection  30 mg Subcutaneous Q24H  . metoprolol tartrate  25 mg Oral BID  . metroNIDAZOLE  500 mg Oral Q8H  . pantoprazole (PROTONIX) IV  40 mg Intravenous Q24H  . sucralfate  1 g Oral TID WC & HS    Continuous Infusions: . sodium chloride 1,000 mL (07/04/12 0800)    Past Medical History  Diagnosis Date  . Hypertension   . Stroke   . CHF (congestive heart failure)   . GERD (gastroesophageal reflux disease)   . High cholesterol   . PONV (postoperative nausea and vomiting)     Past Surgical History  Procedure Laterality Date  . Knee surgery    . Colonoscopy  2006    Dr. Karilyn Cota: ischemic colitis involving descending colon, extensive diverticula  . Esophagogastroduodenoscopy (egd) with esophageal dilation N/A 06/24/2012    Procedure: ESOPHAGOGASTRODUODENOSCOPY (EGD) WITH ESOPHAGEAL DILATION;  Surgeon: Corbin Ade, MD;  Location: [facility];  Service: Endoscopy;  Laterality: N/A;    Royann Shivers MS,RD,LDN,CSG Office: [phone] Pager: [phone]

## 2012-07-04 NOTE — H&P (Signed)
NAMEARDELLA, CHHIM             ACCOUNT NO.:  [id number]  MEDICAL RECORD NO.:  [id number]  LOCATION:  IC02                          FACILITY:  [facility]  PHYSICIAN:  Kingsley Callander. Ouida Sills, MD       DATE OF BIRTH:  1917-08-08  DATE OF ADMISSION:  07/03/2012 DATE OF DISCHARGE:  LH                             HISTORY & PHYSICAL   CHIEF COMPLAINT:  Weakness.  HISTORY OF PRESENT ILLNESS:  This patient is a 79 year old white female who presented to the emergency room with generalized weakness.  She had been discharged from the hospital a week ago after treatment for syncope, C. diff colitis, E coli UTI, erosive esophagitis with bleeding and anemia, and Mobitz type 2 second-degree heart block.  On presentation to the emergency room, she was found to be in rapid atrial fibrillation with a rate in the 140-150s.  She had previously been on metoprolol 100 mg b.i.d. and diltiazem 240 mg daily.  These had been stopped after her syncope and a Mobitz type 2 second-degree heart block. She had been seen by Cardiology. She had been discharged from the hospital on low-dose metoprolol.  She has a history of paroxysmal AFib. She has not been on anticoagulation because of her fall risk.  She has had the development of a rash with itching, primarily involving her trunk and upper extremities.  She is not experiencing dysuria now.  She has had some intermittent formed stools, but she has had a few loose stools at times as well.  She has not had fever.  PAST MEDICAL HISTORY: 1. Hypertension. 2. Stroke. 3. Paroxysmal AFib. 4. CHF. 5. GERD. 6. Hyperlipidemia. 7. C. diff colitis. 8. Erosive esophagitis. 9. UTI. 10.Status post knee replacement. 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  MEDICATIONS: 1. Flagyl 500 mg t.i.d. 2. Cipro 250 mg b.i.d. 3. Amlodipine 5 mg daily. 4. Fish oil daily. 5. Lasix 20 mg daily. 6. Losartan 100 mg daily. 7. Metoprolol 12.5 mg b.i.d. 8. Multivitamin daily. 9. Omeprazole 20 mg daily. 10.Sucralfate  1 g q.i.d. 11.Simvastatin 10 mg at bedtime.  ALLERGIES:  None.  SOCIAL HISTORY:  She does not smoke cigarettes, drink alcohol, or use recreational substances.  FAMILY HISTORY:  Remarkable for diabetes.  REVIEW OF SYSTEMS:  No fever, chills, falls.  She has had nausea but has not vomited.  She has had some loose stools, but has not had signs of bleeding.  She does not have abdominal pain or dysuria.  PHYSICAL EXAMINATION:  VITAL SIGNS:  Temperature 98.7, pulse 115, respirations 20, blood pressure 129/66. GENERAL:  Elderly, alert, somewhat weaker appearing than usual. HEENT:  No scleral icterus.  Oropharynx is moist. NECK:  No JVD or thyromegaly. LUNGS:  Clear. HEART:  Tachycardic and irregularly irregular. ABDOMEN:  Soft, nondistended, nontender with no palpable organomegaly. EXTREMITIES:  Trace pedal edema.  No cyanosis or clubbing. NEURO:  No focal weakness. LYMPH NODES:  No cervical or supraclavicular enlargement.  LABORATORY DATA:  White count 15.3, hemoglobin 10.5, platelets 262,000, 86 segs, 6 lymphs.  Sodium 135, potassium 4.4, bicarb 21, BUN 26, creatinine 1.49, glucose 114, albumin 2.8.  Lactic acid 1.3.  Urinalysis reveals negative nitrite and negative leukocyte esterase.  EKG reveals AFib  with a rapid ventricular response.  IMPRESSION/PLAN: 1. Atrial fibrillation with a rapid ventricular response.  She will     require hospitalization in the ICU and treatment with IV diltiazem.     If she develops second-degree heart block again alternative therapy     may be required.  Cardiology will be consulted regarding her     conduction system disease. 2. Recent Clostridium difficile colitis.  Continue metronidazole.     There seems to be significant improvement. 3. Recent Escherichia coli urinary tract infection, stop Cipro.  This     would be the most likely cause of her probable drug rash treated     with Benadryl as needed. 4. Recent erosive esophagitis with bleeding  and anemia.  Continue     pantoprazole and sucralfate. 5. Anemia.  No need for transfusion at this point. 6. Renal insufficiency.  She had a significant rise in her BUN and     creatinine with associated gastrointestinal     bleeding during her last hospitalization.  BUN and creatinine had     improved since the bleeding has stopped and she has been able to     take in satisfactory hydration. 7. History of congestive heart failure.  No sign of recurrence.     Kingsley Callander. Ouida Sills, MD     ROF/MEDQ  D:  07/04/2012  T:  07/04/2012  Job:  [id number]

## 2012-07-05 LAB — BASIC METABOLIC PANEL
Calcium: 8.2 mg/dL — ABNORMAL LOW (ref 8.4–10.5)
GFR calc Af Amer: 36 mL/min — ABNORMAL LOW (ref >90)
GFR calc non Af Amer: 31 mL/min — ABNORMAL LOW (ref >90)
Glucose, Bld: 143 mg/dL — ABNORMAL HIGH (ref 70–99)
Potassium: 4.2 mEq/L (ref 3.5–5.1)
Sodium: 133 mEq/L — ABNORMAL LOW (ref 135–145)

## 2012-07-05 LAB — CBC
Hemoglobin: 9.7 g/dL — ABNORMAL LOW (ref 12.0–15.0)
MCHC: 32.7 g/dL (ref 30.0–36.0)
Platelets: 259 10*3/uL (ref 150–400)

## 2012-07-05 MED ORDER — PANTOPRAZOLE SODIUM 40 MG PO TBEC
40.0000 mg | DELAYED_RELEASE_TABLET | Freq: Every day | ORAL | Status: DC
  Administered 2012-07-05 – 2012-07-08 (×4): 40 mg via ORAL
  Filled 2012-07-05 (×5): qty 1

## 2012-07-05 MED ORDER — METOPROLOL TARTRATE 50 MG PO TABS
50.0000 mg | ORAL_TABLET | Freq: Two times a day (BID) | ORAL | Status: DC
  Administered 2012-07-05 – 2012-07-08 (×6): 50 mg via ORAL
  Filled 2012-07-05 (×7): qty 1

## 2012-07-05 NOTE — Progress Notes (Signed)
Pt transferred to 300, report given RN. Vital signs stable at d/c.

## 2012-07-06 NOTE — Progress Notes (Signed)
NAMECARLEENA, MIRES             ACCOUNT NO.:  [id number]  MEDICAL RECORD NO.:  [id number]  LOCATION:  A327                          FACILITY:  [facility]  PHYSICIAN:  Kingsley Callander. Ouida Sills, MD       DATE OF BIRTH:  Feb 03, 1918  DATE OF PROCEDURE: DATE OF DISCHARGE:                                PROGRESS NOTE   HISTORY OF PRESENT ILLNESS:  Ms. Bann states she has had 2 bowel movements, neither of which was completely formed.  One stool was recorded in the computer.  She has not had a fever.  Temperature this morning is 97.5 with a pulse in the 70s on telemetry, respirations 20, blood pressure 101/62.  She is alert and wants to go home, but is too weak at this point.  She is reportedly worn out just from trying to get from the bed to the bathroom.  She is accompanied by her daughter.  She does not have any cardiovascular symptoms today.  PHYSICAL EXAMINATION:  LUNGS:  Clear. HEART:  Irregularly irregular with a controlled rate. ABDOMEN:  Soft, nondistended, and nontender. EXTREMITIES:  No edema.  IMPRESSION/PLAN: 1. Atrial fibrillation with rapid ventricular response, improved with     diltiazem and metoprolol.  We will continue her current doses.  She     has not had recurrent second-degree heart block. 2. C. diff colitis.  Continue metronidazole.  Repeat CBC tomorrow. 3. Esophagitis.  Continue pantoprazole and sucralfate. 4. Anemia.  Hemoglobin was 9.7 yesterday.  Recheck tomorrow.  She     shows no obvious signs of bleeding. 5. We will consult Physical Therapy and try to improve her strength     and ability to ambulate.  She may require skilled nursing facility     temporarily after discharge prior to returning home.     Kingsley Callander. Ouida Sills, MD     ROF/MEDQ  D:  07/06/2012  T:  07/06/2012  Job:  [id number]

## 2012-07-06 NOTE — Progress Notes (Signed)
NAMEJAMES, LAFALCE             ACCOUNT NO.:  [id number]  MEDICAL RECORD NO.:  [id number]  LOCATION:  A327                          FACILITY:  [facility]  PHYSICIAN:  Kingsley Callander. Ouida Sills, MD       DATE OF BIRTH:  24-Jan-1918  DATE OF PROCEDURE:  07/05/2012 DATE OF DISCHARGE:                                PROGRESS NOTE   SUBJECTIVE:  Ms. [name] is feeling better.  She has had no significant cardiovascular symptoms.  She has had one stool this morning, which was not completely formed.  She has had a maximum temperature 99.3.  OBJECTIVE:  LUNGS:  Clear. HEART:  Irregularly irregular from the 80s to 110 range on telemetry during my observation. ABDOMEN:  Soft, nontender.  No organomegaly. EXTREMITIES:  No edema.  IMPRESSION/PLAN: 1. Atrial fibrillation with rapid ventricular response improved with     oral diltiazem at 60 mg every 6 hours and metoprolol 25 mg every 12     hours.  We will increase metoprolol to 50 mg every 12 hours.  She     will be moved to the third floor out of the ICU.  Monitor     monitoring will be continued. 2. Clostridium difficile colitis.  She has a persistent leukocytosis     of 14.6.  She is not having significant diarrhea at this point.     Her BUN and creatinine have improved to 23 and 1.41, potassium is     4.2, hemoglobin is trended downward from 10.5 and 9.7. 3. Esophagitis.  Continue pantoprazole and Carafate. 4. History of Mobitz, type 2 heart block.  Continue monitoring.  She     is tolerating her salt diet without difficulty. 5. Drug rash symptoms are improved.  She was treated with a dose of     cortisone yesterday by cardiology.  Continue Benadryl p.r.n.  This     appears to be resolving after Cipro was stopped.     Kingsley Callander. Ouida Sills, MD     ROF/MEDQ  D:  07/05/2012  T:  07/06/2012  Job:  [id number]

## 2012-07-07 LAB — BASIC METABOLIC PANEL
CO2: 20 mEq/L (ref 19–32)
Chloride: 109 mEq/L (ref 96–112)
Glucose, Bld: 102 mg/dL — ABNORMAL HIGH (ref 70–99)
Potassium: 3.9 mEq/L (ref 3.5–5.1)
Sodium: 138 mEq/L (ref 135–145)

## 2012-07-07 LAB — CBC
Hemoglobin: 9.9 g/dL — ABNORMAL LOW (ref 12.0–15.0)
MCH: 25.1 pg — ABNORMAL LOW (ref 26.0–34.0)
Platelets: 268 10*3/uL (ref 150–400)
RBC: 3.95 MIL/uL (ref 3.87–5.11)
WBC: 9.1 10*3/uL (ref 4.0–10.5)

## 2012-07-07 MED ORDER — DILTIAZEM HCL ER COATED BEADS 240 MG PO CP24
240.0000 mg | ORAL_CAPSULE | Freq: Every day | ORAL | Status: DC
  Administered 2012-07-07 – 2012-07-08 (×2): 240 mg via ORAL
  Filled 2012-07-07 (×2): qty 1

## 2012-07-07 MED ORDER — ENOXAPARIN SODIUM 40 MG/0.4ML ~~LOC~~ SOLN: 40.0000 mg | SUBCUTANEOUS | Status: DC

## 2012-07-07 NOTE — Evaluation (Signed)
Physical Therapy Evaluation Patient Details Name: [name] MRN: [id number] DOB: [birth year] Today's Date: 07/07/2012 Time: 7829-5621 PT Time Calculation (min): 32 min  PT Assessment / Plan / Recommendation Clinical Impression  Pt has returned to the hospital due to continuesd diarrhea.  She states that she managed fairly well at home PTA but feels weak in her legs.  Her strength on MMT is WNL but she tires easily.  She is independent with transfers.  Because of her advanced age and general deconditioning  I would recommend that she live in a setting where she has assistance 24 hours/day ([facility]LF or with a family member).   Pt is not eating her meals...she reports that she sometimes chokes on her food and is therefore afraid to eat....she may need a swallow eval.    PT Assessment  Patient needs continued PT services    Follow Up Recommendations  Home health PT;Supervision/Assistance - 24 hour    Does the patient have the potential to tolerate intense rehabilitation      Barriers to Discharge Decreased caregiver support;Inaccessible home environment pt states that she has  "a bunch of steps" to get into her house....daughter has an ill husband to attend to     Equipment Recommendations       Recommendations for Other Services OT consult   Frequency Min 3X/week    Precautions / Restrictions Precautions Precautions: Fall Restrictions Weight Bearing Restrictions: No   Pertinent Vitals/Pain       Mobility  Bed Mobility Rolling Right: 7: Independent Right Sidelying to Sit: 7: Independent Transfers Sit to Stand: 6: Modified independent (Device/Increase time);From bed Stand to Sit: 7: Independent;To bed Ambulation/Gait Ambulation/Gait Assistance: 5: Supervision Ambulation Distance (Feet): 140 Feet Assistive device: Rolling walker Gait Pattern: Trunk flexed;Within Functional Limits Gait velocity: WNL Stairs: No Wheelchair Mobility Wheelchair Mobility: No    Exercises      PT Diagnosis: Difficulty walking;Generalized weakness  PT Problem List: Decreased strength;Decreased activity tolerance;Decreased mobility PT Treatment Interventions: Gait training;Stair training;Therapeutic activities;Therapeutic exercise;Patient/family education   PT Goals Acute Rehab PT Goals PT Goal Formulation: With patient Potential to Achieve Goals: Good PT Goal: Supine/Side to Sit - Progress: Discontinued (comment) PT Goal: Sit to Stand - Progress: Discontinued (comment) PT Goal: Stand to Sit - Progress: Discontinued (comment) Pt will Ambulate: >150 feet;with modified independence;with rolling walker PT Goal: Ambulate - Progress: Goal set today Pt will Go Up / Down Stairs: 3-5 stairs;with supervision;with rail(s) PT Goal: Up/Down Stairs - Progress: Goal set today  Visit Information  Last PT Received On: 07/07/12    Subjective Data  Subjective: Was able to function at home after discharge last week, but diarrhea still continues...reports feeling weak in her legs...states that she doesn't want to eat as she sometimes chokes on her food Patient Stated Goal: return home   Prior Functioning       Cognition  Cognition Overall Cognitive Status: Appears within functional limits for tasks assessed/performed Arousal/Alertness: Awake/alert Orientation Level: Appears intact for tasks assessed Behavior During Session: [facility] for tasks performed    Extremity/Trunk Assessment Right Lower Extremity Assessment RLE ROM/Strength/Tone: [facility] for tasks assessed RLE Sensation: WFL - Light Touch RLE Coordination: WFL - gross motor Left Lower Extremity Assessment LLE ROM/Strength/Tone: WFL for tasks assessed LLE Sensation: WFL - Light Touch LLE Coordination: WFL - gross motor Trunk Assessment Trunk Assessment: Kyphotic   Balance Balance Balance Assessed:  (WNL by functional observation)  End of Session PT - End of Session Equipment Utilized During Treatment: Gait  belt Activity  Tolerance: Patient tolerated treatment well Patient left: in bed;with call bell/phone within reach;with bed alarm set Nurse Communication: Mobility status  GP     Konrad Penta 07/07/2012, 9:40 AM

## 2012-07-07 NOTE — Progress Notes (Signed)
   Primary cardiologist: Dr. Charlton Haws  Subjective:   States she feels better. No palpitations or chest pain.   Objective:   Temp:  [97.5 F (36.4 C)-98.1 F (36.7 C)] 98.1 F (36.7 C) (03/31 0602) Pulse Rate:  [63-116] 116 (03/31 0602) Resp:  [18] 18 (03/31 0602) BP: (113-125)/(39-67) 117/67 mmHg (03/31 0602) SpO2:  [93 %-97 %] 97 % (03/31 0602) Weight:  [182 lb 15.7 oz (83 kg)] 182 lb 15.7 oz (83 kg) (03/31 0500) Last BM Date: 07/06/12  Filed Weights   07/04/12 0500 07/05/12 0500 07/07/12 0500  Weight: 182 lb 1.6 oz (82.6 kg) 180 lb 12.4 oz (82 kg) 182 lb 15.7 oz (83 kg)    Intake/Output Summary (Last 24 hours) at 07/07/12 1012 Last data filed at 07/06/12 1800  Gross per 24 hour  Intake    480 ml  Output      0 ml  Net    480 ml   Telemetry: Atrial fibrillation, occasional PVCs.  Exam:  General: NAD.  Lungs: Coarse but clear.  Cardiac: Irregularly irregular.  Extremities: No pitting.  Lab Results:  Basic Metabolic Panel:  Recent Labs Lab 07/03/12 0951 07/05/12 0522 07/07/12 0548  NA 135 133* 138  K 4.4 4.2 3.9  CL 105 104 109  CO2 21 17* 20  GLUCOSE 114* 143* 102*  BUN 26* 23 24*  CREATININE 1.49* 1.41* 1.30*  CALCIUM 8.5 8.2* 8.1*    CBC:  Recent Labs Lab 07/03/12 0951 07/05/12 0522 07/07/12 0548  WBC 15.3* 14.6* 9.1  HGB 10.5* 9.7* 9.9*  HCT 32.4* 29.7* 31.1*  MCV 78.8 78.6 78.7  PLT 262 259 268    ECG: Atrial fibrillation with IVCD, poor R wave progression, leftward axis.   Medications:   Scheduled Medications: . diltiazem  60 mg Oral Q6H  . enoxaparin (LOVENOX) injection  40 mg Subcutaneous Q24H  . feeding supplement  1 Container Oral BID BM  . metoprolol tartrate  50 mg Oral BID  . metroNIDAZOLE  500 mg Oral Q8H  . pantoprazole  40 mg Oral Daily  . sucralfate  1 g Oral TID WC & HS     Infusions: . sodium chloride 1,000 mL (07/05/12 1300)     PRN Medications:  acetaminophen, acetaminophen, alum & mag  hydroxide-simeth, diphenhydrAMINE, ondansetron (ZOFRAN) IV, ondansetron   Assessment:   1. Atrial fibrillation, now on oral medications for heart rate control. Deemed a poor candidate for anticoagulation, seen in consultation by Dr. Tenny Craw last week. Continue to titrate for optimal heart rate control.  2. Previous episode of Mobitz type II heart block, transient, and not recurrent on current regimen.  3. Hypertension, blood pressure well controlled.   Plan/Discussion:    Will change Cardizem to long-acting formulation at 240 mg daily. Otherwise continue beta blocker.   Jonelle Sidle, M.D., F.A.C.C.

## 2012-07-08 DIAGNOSIS — I4891 Unspecified atrial fibrillation: Secondary | ICD-10-CM

## 2012-07-08 DIAGNOSIS — I10 Essential (primary) hypertension: Secondary | ICD-10-CM

## 2012-07-08 LAB — CULTURE, BLOOD (ROUTINE X 2): Culture: NO GROWTH

## 2012-07-08 MED ORDER — DILTIAZEM HCL ER COATED BEADS 240 MG PO CP24: 240.0000 mg | ORAL_CAPSULE | Freq: Every day | ORAL | Status: AC

## 2012-07-08 MED ORDER — METOPROLOL TARTRATE 50 MG PO TABS: 50.0000 mg | ORAL_TABLET | Freq: Two times a day (BID) | ORAL | Status: DC

## 2012-07-08 MED ORDER — PANTOPRAZOLE SODIUM 40 MG PO TBEC: 40.0000 mg | DELAYED_RELEASE_TABLET | Freq: Every day | ORAL | Status: DC

## 2012-07-08 NOTE — Progress Notes (Signed)
SUBJECTIVE:States she is going home today. Complaints of rash on skin, arms and upper chest.  LABS: Basic Metabolic Panel:  Recent Labs  16/10/96 0548  NA 138  K 3.9  CL 109  CO2 20  GLUCOSE 102*  BUN 24*  CREATININE 1.30*  CALCIUM 8.1*   CBC:  Recent Labs  07/07/12 0548  WBC 9.1  HGB 9.9*  HCT 31.1*  MCV 78.7  PLT 268   PHYSICAL EXAM BP 104/50  Pulse 61  Temp(Src) 98.8 F (37.1 C) (Oral)  Resp 18  Ht 5\' 8"  (1.727 m)  Wt 182 lb 15.7 oz (83 kg)  BMI 27.83 kg/m2  SpO2 93% General: Well developed, well nourished, in no acute distress Head: Eyes PERRLA, No xanthomas.   Normal cephalic and atramatic  Lungs: Clear bilaterally to auscultation and percussion. Heart: HRIR S1 S2, No MRG .  Pulses are 2+ & equal.            No carotid bruit. No JVD.  No abdominal bruits. No femoral bruits. Abdomen: Bowel sounds are positive, abdomen soft and non-tender without masses or                  Hernia's noted. Msk:  Back normal, normal gait. Normal strength and tone for age. Extremities: No clubbing, cyanosis or edema. Red, dry rash on arms and torso. DP +1 Neuro: Alert and oriented X 3. Psych:  Good affect, responds appropriately  TELEMETRY: Reviewed telemetry pt in: Atrial fibrillation rate of 100 bpm.  ASSESSMENT AND PLAN:  1. Atrial fib with RVR: Has been placed on Cardizem 240 mg daily, and continues on low-dose metoprolol 50 mg twice a day. No evidence of heart block this admission. She is not a candidate for anticoagulation. We will make followup appointment in our office for ongoing assessment post hospitalization.  2. Hypertension: Blood pressure is currently well-controlled. She appears to be tolerating doses of Cardizem and metoprolol without hypotension.  3. Deconditioning: Has been seen by physical therapy on 07/07/2012. Recommendations for inpatient physical therapy for ongoing strengthening. Also swallowing study was recommended.  Bettey Mare. Lyman Bishop NP [facility] 07/08/2012, 8:49 AM  Cardiology Attending Patient interviewed and examined. Discussed with Joni Reining, NP.  Above note annotated and modified based upon my findings.  Adequate control of heart rate in atrial fibrillation and of hypertension. Patient can be discharged from cardiology standpoint.  [place] Bing, MD 07/10/2012, 9:26 AM

## 2012-07-08 NOTE — Progress Notes (Signed)
NAMEROBECCA, FULGHAM             ACCOUNT NO.:  [id number]  MEDICAL RECORD NO.:  [id number]  LOCATION:                                 FACILITY:  PHYSICIAN:  Kingsley Callander. Ouida Sills, MD       DATE OF BIRTH:  02-06-1918  DATE OF PROCEDURE:  07/07/2012 DATE OF DISCHARGE:                                PROGRESS NOTE   SUBJECTIVE:  Ms. Sapp complains of several stools.  Stools are not formed.  She denies that they are totally watery, but she is not feeling as well.  PHYSICAL EXAMINATION:  VITAL SIGNS:  She has not been febrile. Temperature is 98.1, heart rate has been recorded from 63-116, respirations 18, blood pressure 117/67. GENERAL:  She is alert and comfortable appearing. LUNGS:  Clear. HEART:  Irregularly irregular. ABDOMEN:  Soft, nondistended, nontender with no palpable organomegaly.  IMPRESSION/PLAN: 1. Rapid atrial fibrillation, improved with diltiazem and metoprolol. 2. Clostridium difficile colitis.  Recheck stool for clostridium     difficile toxin.  It appears she will require transition to     vancomycin.  Her white count that was normalized from 14.6-9.1.     Her hemoglobin is stable at 9.9.  Renal function is modestly     improved with a BUN and creatinine of 24 and 1.3.  Potassium is     3.9.     Kingsley Callander. Ouida Sills, MD     ROF/MEDQ  D:  07/07/2012  T:  07/08/2012  Job:  [id number]

## 2012-07-09 NOTE — Discharge Summary (Signed)
NAMELORELEY, SCHWALL             ACCOUNT NO.:  [id number]  MEDICAL RECORD NO.:  [id number]  LOCATION:                                 FACILITY:  PHYSICIAN:  Kingsley Callander. Ouida Sills, MD       DATE OF BIRTH:  1917/11/12  DATE OF ADMISSION:  07/03/2012 DATE OF DISCHARGE:  04/01/2014LH                              DISCHARGE SUMMARY   DISCHARGED DIAGNOSES: 1. Atrial fibrillation with rapid ventricular response. 2. History of Mobitz type 2 second-degree heart block. 3. Clostridium difficile colitis. 4. Hypertension. 5. Coronary artery disease. 6. Congestive heart failure. 7. Erosive esophagitis. 8. History of stroke. 9. Hyperlipidemia. 10.Carotid aneurysms. 11.Recent E. coli urinary tract infection. 12.Drug rash due to Cipro.  DISCHARGE MEDICATIONS:  Metoprolol 50 mg b.i.d., diltiazem CD 240 mg daily, Pantoprazole 40 mg daily, simvastatin 10 mg at bedtime.  HOSPITAL COURSE:  This patient is a 79 year old female who presented with generalized weakness.  She was found to be in AFib with a rapid ventricular response in the 140s-150s.  She was treated with a diltiazem drip.  Her heart rate improved.  She was seen by Cardiology.  She was ultimately transitioned to oral diltiazem and metoprolol.  Her heart rate is now satisfactorily controlled in the 80 range.  Blood pressure has been low normal.  Amlodipine, Lasix, and losartan have been on hold. She has shown no sign of volume overload.  She has been treated for C. diff colitis with metronidazole, and her stools have become formed now without persistent diarrhea.  She has had an adequate course of metronidazole, therefore, this will be discontinued at discharge.  She has had a recent drug rash to Cipro.  This has improved after discontinuing Cipro.  She had been treated for an E. coli urinary tract infection.  She recently had an episode of Mobitz type 2 second degree heart block during her last hospitalization.  She has shown no sign  of recurrence of this.  She had a GI bleed during her last hospitalization, felt related to erosive esophagitis.  She has been treated with PPI therapy with pantoprazole and with Carafate.  She has had an adequate course of Carafate now and this will be discontinued.  She will continue chronic PPI therapy.  She has been evaluated and treated with physical therapy.  She will have home health nursing and an aid at home.  CONDITION AT DISCHARGE:  Much improved.  DISCHARGE INSTRUCTIONS:  She will be seen in followup in my office in 1 week.  She is taking a soft diet without difficulty.     Kingsley Callander. Ouida Sills, MD     ROF/MEDQ  D:  07/08/2012  T:  07/08/2012  Job:  [id number]

## 2012-07-11 ENCOUNTER — Encounter: Payer: Medicare Other | Admitting: Adult Health

## 2012-07-14 ENCOUNTER — Encounter: Payer: Self-pay | Admitting: Adult Health

## 2012-07-14 ENCOUNTER — Ambulatory Visit (INDEPENDENT_AMBULATORY_CARE_PROVIDER_SITE_OTHER): Payer: Medicare Other | Admitting: Adult Health

## 2012-07-14 ENCOUNTER — Ambulatory Visit (HOSPITAL_COMMUNITY)
Admission: RE | Admit: 2012-07-14 | Discharge: 2012-07-14 | Disposition: A | Discharge status: Home / Self Care | Payer: Medicare Other | Source: Ambulatory Visit | Attending: Adult Health | Admitting: Adult Health

## 2012-07-14 VITALS — BP 102/60 | HR 88 | Ht 68.0 in | Wt 187.0 lb

## 2012-07-14 DIAGNOSIS — J90 Pleural effusion, not elsewhere classified: Secondary | ICD-10-CM | POA: Insufficient documentation

## 2012-07-14 DIAGNOSIS — I509 Heart failure, unspecified: Secondary | ICD-10-CM

## 2012-07-14 DIAGNOSIS — I4891 Unspecified atrial fibrillation: Secondary | ICD-10-CM

## 2012-07-14 DIAGNOSIS — I10 Essential (primary) hypertension: Secondary | ICD-10-CM

## 2012-07-14 MED ORDER — POTASSIUM CHLORIDE CRYS ER 20 MEQ PO TBCR: 20.0000 meq | EXTENDED_RELEASE_TABLET | Freq: Every day | ORAL | Status: DC

## 2012-07-14 NOTE — Progress Notes (Signed)
HPIThis patient is a 79 year old female of Dr. Dietrich Pates we are seeing posthospitalization, we consulted on her during recent admission when she presented  with generalized weakness. She was found to be in AFib with a rapid  ventricular response in the 140s-150s. She was treated with a diltiazem drip. Her heart rate improved. She was seen by Cardiology. She was ultimately transitioned to oral diltiazem and metoprolol. Her heart  rate is now satisfactorily controlled in the 80 range.    She comes today with increased work of breathing, wheezing, cough, dyspnea on exertion, and lower extremity edema, and edema in her hands. Her daughter who is with her, states that she is become increasingly weaker. Home health nurses are available to help her. But her daughter is concerned about the fluid retention. She has not been placed on any Lasix since recent discharge. Blood pressure is low normal in the 102/60 on this visit. She has gained approximately 5 pounds since discharge.   Allergies  Allergen Reactions  . Ciprofloxacin Rash    Current Outpatient Prescriptions  Medication Sig Dispense Refill  . diltiazem (CARDIZEM CD) 240 MG 24 hr capsule Take 1 capsule (240 mg total) by mouth daily.  30 capsule  12  . metoprolol (LOPRESSOR) 50 MG tablet Take 1 tablet (50 mg total) by mouth 2 (two) times daily.  60 tablet  12  . pantoprazole (PROTONIX) 40 MG tablet Take 1 tablet (40 mg total) by mouth daily.  30 tablet  12  . simvastatin (ZOCOR) 10 MG tablet Take 10 mg by mouth at bedtime.      . potassium chloride SA (K-DUR,KLOR-CON) 20 MEQ tablet Take 1 tablet (20 mEq total) by mouth daily.  30 tablet  6   No current facility-administered medications for this visit.    Past Medical History  Diagnosis Date  . Hypertension   . Stroke   . CHF (congestive heart failure)   . GERD (gastroesophageal reflux disease)   . High cholesterol   . PONV (postoperative nausea and vomiting)     Past Surgical History    Procedure Laterality Date  . Knee surgery    . Colonoscopy  2006    Dr. Karilyn Cota: ischemic colitis involving descending colon, extensive diverticula  . Esophagogastroduodenoscopy (egd) with esophageal dilation N/A 06/24/2012    Procedure: ESOPHAGOGASTRODUODENOSCOPY (EGD) WITH ESOPHAGEAL DILATION;  Surgeon: Corbin Ade, MD;  Location: [facility];  Service: Endoscopy;  Laterality: N/A;    RUE:AVWUJW of systems complete and found to be negative unless listed above  PHYSICAL EXAM BP 102/60  Pulse 88  Ht 5\' 8"  (1.727 m)  Wt 187 lb (84.823 kg)  BMI 28.44 kg/m2  SpO2 91% General: Well developed, well nourished, dyspnea at rest.  Head: Eyes PERRLA, No xanthomas.   Normal cephalic and atramatic  Lungs: Respiratory expiratory wheezes with bibasilar crackles.  Heart: HRRR S1 S2, without MRG.  Pulses are 2+ & equal.            No carotid bruit. No JVD at 12 cm..  No abdominal bruits. No femoral bruits. Abdomen: Bowel sounds are positive, abdomen soft and non-tender without masses or                  Hernia's noted. Msk:  Back normal, slow unsteady gait. She is now in a wheelchair. Significantly diminished  strength and tone for age. Extremities: No clubbing, cyanosis, 2+ pitting pretibial edema.  DP +1 Neuro: Alert and oriented X 3. Psych:  Good affect, responds appropriately  EKG: Atrial fibrillation rate of 87 beats per minute, with nonspecific lateral and inferior T-wave abnormalities  ASSESSMENT AND PLAN

## 2012-07-14 NOTE — Progress Notes (Deleted)
Name: [name]    DOB: 05-29-1917  Age: 79 y.o.  MR#: [id number]       PCP:  Carylon Perches, MD      Insurance: Payor: Advertising copywriter MEDICARE  Plan: AARP MEDICARE COMPLETE  Product Type: *No Product type*    CC:    Chief Complaint  Patient presents with  . Atrial Fibrillation  . Hypertension    VS Filed Vitals:   07/14/12 1447  BP: 102/60  Pulse: 88  Height: 5\' 8"  (1.727 m)  Weight: 187 lb (84.823 kg)  SpO2: 91%    Weights Current Weight  07/14/12 187 lb (84.823 kg)  07/08/12 182 lb 15.7 oz (83 kg)  06/27/12 182 lb 1.6 oz (82.6 kg)    Blood Pressure  BP Readings from Last 3 Encounters:  07/14/12 102/60  07/07/12 104/50  06/27/12 144/76     Admit date:  (Not on file) Last encounter with RMR:  07/11/2012   Allergy Ciprofloxacin  Current Outpatient Prescriptions  Medication Sig Dispense Refill  . diltiazem (CARDIZEM CD) 240 MG 24 hr capsule Take 1 capsule (240 mg total) by mouth daily.  30 capsule  12  . metoprolol (LOPRESSOR) 50 MG tablet Take 1 tablet (50 mg total) by mouth 2 (two) times daily.  60 tablet  12  . pantoprazole (PROTONIX) 40 MG tablet Take 1 tablet (40 mg total) by mouth daily.  30 tablet  12  . simvastatin (ZOCOR) 10 MG tablet Take 10 mg by mouth at bedtime.       No current facility-administered medications for this visit.    Discontinued Meds:   There are no discontinued medications.  Patient Active Problem List  Diagnosis  . Syncope  . Essential hypertension, benign  . CVA (cerebral infarction)  . GERD (gastroesophageal reflux disease)  . Noninfectious gastroenteritis and colitis  . Bradycardia  . Atrial fibrillation  . Rash and nonspecific skin eruption    LABS    Component Value Date/Time   NA 138 07/07/2012 0548   NA 133* 07/05/2012 0522   NA 135 07/03/2012 0951   K 3.9 07/07/2012 0548   K 4.2 07/05/2012 0522   K 4.4 07/03/2012 0951   CL 109 07/07/2012 0548   CL 104 07/05/2012 0522   CL 105 07/03/2012 0951   CO2 20 07/07/2012  0548   CO2 17* 07/05/2012 0522   CO2 21 07/03/2012 0951   GLUCOSE 102* 07/07/2012 0548   GLUCOSE 143* 07/05/2012 0522   GLUCOSE 114* 07/03/2012 0951   BUN 24* 07/07/2012 0548   BUN 23 07/05/2012 0522   BUN 26* 07/03/2012 0951   CREATININE 1.30* 07/07/2012 0548   CREATININE 1.41* 07/05/2012 0522   CREATININE 1.49* 07/03/2012 0951   CALCIUM 8.1* 07/07/2012 0548   CALCIUM 8.2* 07/05/2012 0522   CALCIUM 8.5 07/03/2012 0951   GFRNONAA 35* 07/07/2012 0548   GFRNONAA 31* 07/05/2012 0522   GFRNONAA 29* 07/03/2012 0951   GFRAA 40* 07/07/2012 0548   GFRAA 36* 07/05/2012 0522   GFRAA 34* 07/03/2012 0951   CMP     Component Value Date/Time   NA 138 07/07/2012 0548   K 3.9 07/07/2012 0548   CL 109 07/07/2012 0548   CO2 20 07/07/2012 0548   GLUCOSE 102* 07/07/2012 0548   BUN 24* 07/07/2012 0548   CREATININE 1.30* 07/07/2012 0548   CALCIUM 8.1* 07/07/2012 0548   PROT 6.2 07/03/2012 0951   ALBUMIN 2.8* 07/03/2012 0951   AST 14 07/03/2012 0951   ALT  9 07/03/2012 0951   ALKPHOS 81 07/03/2012 0951   BILITOT 0.7 07/03/2012 0951   GFRNONAA 35* 07/07/2012 0548   GFRAA 40* 07/07/2012 0548       Component Value Date/Time   WBC 9.1 07/07/2012 0548   WBC 14.6* 07/05/2012 0522   WBC 15.3* 07/03/2012 0951   HGB 9.9* 07/07/2012 0548   HGB 9.7* 07/05/2012 0522   HGB 10.5* 07/03/2012 0951   HCT 31.1* 07/07/2012 0548   HCT 29.7* 07/05/2012 0522   HCT 32.4* 07/03/2012 0951   MCV 78.7 07/07/2012 0548   MCV 78.6 07/05/2012 0522   MCV 78.8 07/03/2012 0951    Lipid Panel     Component Value Date/Time   CHOL  Value: 112        ATP III CLASSIFICATION:  <200     mg/dL   Desirable  161-096  mg/dL   Borderline High  >=045    mg/dL   High        40/12/8117 0100   TRIG 106 03/16/2009 0100   HDL 58 03/16/2009 0100   CHOLHDL 1.9 03/16/2009 0100   VLDL 21 03/16/2009 0100   LDLCALC  Value: 33        Total Cholesterol/HDL:CHD Risk Coronary Heart Disease Risk Table                     Men   Women  1/2 Average Risk   3.4   3.3  Average Risk       5.0    4.4  2 X Average Risk   9.6   7.1  3 X Average Risk  23.4   11.0        Use the calculated Patient Ratio above and the CHD Risk Table to determine the patient's CHD Risk.        ATP III CLASSIFICATION (LDL):  <100     mg/dL   Optimal  147-829  mg/dL   Near or Above                    Optimal  130-159  mg/dL   Borderline  562-130  mg/dL   High  >865     mg/dL   Very High 78/07/6960 9528    ABG    Component Value Date/Time   TCO2 26 12/04/2008 1433     Lab Results  Component Value Date   TSH 6.563 ***Test methodology is 3rd generation TSH**** 03/16/2009   BNP (last 3 results) No results found for this basename: PROBNP,  in the last 8760 hours Cardiac Panel (last 3 results) No results found for this basename: CKTOTAL, CKMB, TROPONINI, RELINDX,  in the last 72 hours  Iron/TIBC/Ferritin    Component Value Date/Time   IRON 38* 06/22/2012 0921   TIBC 371 06/22/2012 0921   FERRITIN 9* 06/22/2012 0921     EKG Orders placed in visit on 07/14/12  . EKG 12-LEAD     Prior Assessment and Plan Problem List as of 07/14/2012     ICD-9-CM     Cardiology Problems   Syncope   Essential hypertension, benign   Atrial fibrillation     Other   CVA (cerebral infarction)   GERD (gastroesophageal reflux disease)   Noninfectious gastroenteritis and colitis   Bradycardia   Rash and nonspecific skin eruption       Imaging: Dg Chest Port 1 View  06/21/2012  *RADIOLOGY REPORT*  Clinical Data: Syncope.  PORTABLE CHEST - 1 VIEW  Comparison: 11/17/2009 prior chest radiographs dating back to 11/2003.  Findings: Cardiomegaly again noted. Mild elevation of the left hemidiaphragm with minimal basilar scarring again noted. There is no evidence of focal airspace disease, pulmonary edema, suspicious pulmonary nodule/mass, pleural effusion, or pneumothorax. No acute bony abnormalities are identified.  IMPRESSION: Cardiomegaly without evidence of acute cardiopulmonary disease.   Original Report Authenticated By:  Harmon Pier, M.D.

## 2012-07-14 NOTE — Assessment & Plan Note (Signed)
Blood pressure is low normal on Cardizem 240 mg, with metoprolol 50 mg twice a day. May need to consider decreasing diltiazem if she becomes hypotensive. She is very sedentary, and very deconditioned. A low normal blood pressure may be adequate for her at this time until her physical stamina returns. Close followup will see her again in one week.

## 2012-07-14 NOTE — Patient Instructions (Addendum)
Your physician recommends that you schedule a follow-up appointment in: 1 week  Your physician has recommended you make the following change in your medication:  1 - Take 40 mg of Lasix today only, then begin 20 mg daily thereafter 2 - START Potassium 20 meq daily, starting today  A chest x-ray takes a picture of the organs and structures inside the chest, including the heart, lungs, and blood vessels. This test can show several things, including, whether the heart is enlarges; whether fluid is building up in the lungs; and whether pacemaker / defibrillator leads are still in place.

## 2012-07-14 NOTE — Assessment & Plan Note (Signed)
Heart rate is generally well-controlled. She appears to be having evidence of CHF. Expect to see some edema in the lower extremities with use of calcium channel blocker, but she has significant edema pretibially with associated wheezes and rhonchi. Will have a chest x-ray completed, give her Lasix for which she will take 40 mg this afternoon with 20 mEq of potassium. She will take 20 mg of Lasix in the morning and daily. She is due to see Dr. Ouida Sills in the morning. By that time further assessment can be made concerning her blood pressure along status. Chest x-ray should be available by then. Do not want to over diuresis her in the setting of hypotension. However with atrial fibrillation accuracy of blood pressure is in question.

## 2012-07-18 ENCOUNTER — Encounter: Payer: Medicare Other | Admitting: Adult Health

## 2012-07-22 ENCOUNTER — Ambulatory Visit (INDEPENDENT_AMBULATORY_CARE_PROVIDER_SITE_OTHER): Payer: Medicare Other | Admitting: Adult Health

## 2012-07-22 ENCOUNTER — Encounter: Payer: Self-pay | Admitting: Adult Health

## 2012-07-22 VITALS — BP 125/78 | HR 79 | Ht 68.0 in | Wt 178.0 lb

## 2012-07-22 DIAGNOSIS — I509 Heart failure, unspecified: Secondary | ICD-10-CM

## 2012-07-22 DIAGNOSIS — I10 Essential (primary) hypertension: Secondary | ICD-10-CM

## 2012-07-22 NOTE — Addendum Note (Signed)
Addended by: Jodelle Gross on: 07/22/2012 04:09 PM   Modules accepted: Medications

## 2012-07-22 NOTE — Progress Notes (Signed)
   HPI [name] is a 79 year old patient of Dr. Dietrich Pates we are seeing for ongoing assessment and management of atrial fibrillation, with history of CHF and hypertension. She was last seen in the office on 07/14/2012 with complaints of lower extremity edema. She was restarted on Lasix 20 mg daily with potassium replacement here follow up BMET was ordered along with a CBC.     She was found to be anemic with a hemoglobin of 9.9 and hematocrit 31.1. Her creatinine was 1.30, potassium 3.9. Chest x-ray was also ordered revealing cardiomegaly and new bilateral effusions with new left base atelectasis. She is due to followup with her primary care physician Dr. Ouida Sills the day after seeing her in the office last week.   She states that she did see Dr. Ouida Sills who placed her back on a "green pill". She is unhappy with taking lasix as this is causing her to urinate too much. She has lost 9 lbs since being seen last. She continues to have dietary noncompliance with salty foods.   Allergies  Allergen Reactions  . Ciprofloxacin Rash    Current Outpatient Prescriptions  Medication Sig Dispense Refill  . diltiazem (CARDIZEM CD) 240 MG 24 hr capsule Take 1 capsule (240 mg total) by mouth daily.  30 capsule  12  . metoprolol (LOPRESSOR) 50 MG tablet Take 1 tablet (50 mg total) by mouth 2 (two) times daily.  60 tablet  12  . pantoprazole (PROTONIX) 40 MG tablet Take 1 tablet (40 mg total) by mouth daily.  30 tablet  12  . potassium chloride SA (K-DUR,KLOR-CON) 20 MEQ tablet Take 1 tablet (20 mEq total) by mouth daily.  30 tablet  6  . simvastatin (ZOCOR) 10 MG tablet Take 10 mg by mouth at bedtime.       No current facility-administered medications for this visit.    Past Medical History  Diagnosis Date  . Hypertension   . Stroke   . CHF (congestive heart failure)   . GERD (gastroesophageal reflux disease)   . High cholesterol   . PONV (postoperative nausea and vomiting)     Past Surgical History    Procedure Laterality Date  . Knee surgery    . Colonoscopy  2006    Dr. Karilyn Cota: ischemic colitis involving descending colon, extensive diverticula  . Esophagogastroduodenoscopy (egd) with esophageal dilation N/A 06/24/2012    Procedure: ESOPHAGOGASTRODUODENOSCOPY (EGD) WITH ESOPHAGEAL DILATION;  Surgeon: Corbin Ade, MD;  Location: [facility];  Service: Endoscopy;  Laterality: N/A;    EXB:MWUXLK of systems complete and found to be negative unless listed above  PHYSICAL EXAM BP 125/78  Pulse 79  Ht 5\' 8"  (1.727 m)  Wt 178 lb (80.74 kg)  BMI 27.07 kg/m2  SpO2 95% General: Well developed, well nourished, in no acute distress Head: Eyes PERRLA, No xanthomas.   Normal cephalic and atramatic  Lungs: Clear bilaterally to auscultation and percussion. Heart: HRIR S1 S2,.  Pulses are 2+ & equal.            No carotid bruit. No JVD.  No abdominal bruits. No femoral bruits. Abdomen: Bowel sounds are positive, abdomen soft and non-tender without masses or  Hernia's noted. Msk:  Back normal, in wheelchair, Normal strength and tone for age. Extremities: No clubbing, cyanosis 1+ pittingedema.  DP +1 Neuro: Alert and oriented X 3.Poor historian. Psych:  Good affect, responds appropriately    ASSESSMENT AND PLAN

## 2012-07-22 NOTE — Patient Instructions (Addendum)
Your physician recommends that you schedule a follow-up appointment in: 1 month  Your physician recommends that you return for lab work in: Today  Take Lasix as directed   2 Gram Low Sodium Diet A 2 gram sodium diet restricts the amount of sodium in the diet to no more than 2 g or 2000 mg daily. Limiting the amount of sodium is often used to help lower blood pressure. It is important if you have heart, liver, or kidney problems. Many foods contain sodium for flavor and sometimes as a preservative. When the amount of sodium in a diet needs to be low, it is important to know what to look for when choosing foods and drinks. The following includes some information and guidelines to help make it easier for you to adapt to a low sodium diet. QUICK TIPS  Do not add salt to food.  Avoid convenience items and fast food.  Choose unsalted snack foods.  Buy lower sodium products, often labeled as "lower sodium" or "no salt added."  Check food labels to learn how much sodium is in 1 serving.  When eating at a restaurant, ask that your food be prepared with less salt or none, if possible. READING FOOD LABELS FOR SODIUM INFORMATION The nutrition facts label is a good place to find how much sodium is in foods. Look for products with no more than 500 to 600 mg of sodium per meal and no more than 150 mg per serving. Remember that 2 g = 2000 mg. The food label may also list foods as:  Sodium-free: Less than 5 mg in a serving.  Very low sodium: 35 mg or less in a serving.  Low-sodium: 140 mg or less in a serving.  Light in sodium: 50% less sodium in a serving. For example, if a food that usually has 300 mg of sodium is changed to become light in sodium, it will have 150 mg of sodium.  Reduced sodium: 25% less sodium in a serving. For example, if a food that usually has 400 mg of sodium is changed to reduced sodium, it will have 300 mg of sodium. CHOOSING FOODS Grains  Avoid: Salted crackers and  snack items. Some cereals, including instant hot cereals. Bread stuffing and biscuit mixes. Seasoned rice or pasta mixes.  Choose: Unsalted snack items. Low-sodium cereals, oats, puffed wheat and rice, shredded wheat. English muffins and bread. Pasta. Meats  Avoid: Salted, canned, smoked, spiced, pickled meats, including fish and poultry. Bacon, ham, sausage, cold cuts, hot dogs, anchovies.  Choose: Low-sodium canned tuna and salmon. Fresh or frozen meat, poultry, and fish. Dairy  Avoid: Processed cheese and spreads. Cottage cheese. Buttermilk and condensed milk. Regular cheese.  Choose: Milk. Low-sodium cottage cheese. Yogurt. Sour cream. Low-sodium cheese. Fruits and Vegetables  Avoid: Regular canned vegetables. Regular canned tomato sauce and paste. Frozen vegetables in sauces. Olives. Rosita Fire. Relishes. Sauerkraut.  Choose: Low-sodium canned vegetables. Low-sodium tomato sauce and paste. Frozen or fresh vegetables. Fresh and frozen fruit. Condiments  Avoid: Canned and packaged gravies. Worcestershire sauce. Tartar sauce. Barbecue sauce. Soy sauce. Steak sauce. Ketchup. Onion, garlic, and table salt. Meat flavorings and tenderizers.  Choose: Fresh and dried herbs and spices. Low-sodium varieties of mustard and ketchup. Lemon juice. Tabasco sauce. Horseradish. SAMPLE 2 GRAM SODIUM MEAL PLAN Breakfast / Sodium (mg)  1 cup low-fat milk / 143 mg  2 slices whole-wheat toast / 270 mg  1 tbs heart-healthy margarine / 153 mg  1 hard-boiled egg / 139 mg  1 small orange / 0 mg Lunch / Sodium (mg)  1 cup raw carrots / 76 mg   cup hummus / 298 mg  1 cup low-fat milk / 143 mg   cup red grapes / 2 mg  1 whole-wheat pita bread / 356 mg Dinner / Sodium (mg)  1 cup whole-wheat pasta / 2 mg  1 cup low-sodium tomato sauce / 73 mg  3 oz lean ground beef / 57 mg  1 small side salad (1 cup raw spinach leaves,  cup cucumber,  cup yellow bell pepper) with 1 tsp olive oil and 1  tsp red wine vinegar / 25 mg Snack / Sodium (mg)  1 container low-fat vanilla yogurt / 107 mg  3 graham cracker squares / 127 mg Nutrient Analysis  Calories: 2033  Protein: 77 g  Carbohydrate: 282 g  Fat: 72 g  Sodium: 1971 mg Document Released: 03/26/2005 Document Revised: 06/18/2011 Document Reviewed: 06/27/2009 [facility] Patient Information 2013 [place], [place].

## 2012-07-22 NOTE — Progress Notes (Deleted)
Name: [name]    DOB: 1917-04-18  Age: 79 y.o.  MR#: [id number]       PCP:  Carylon Perches, MD      Insurance: Payor: Advertising copywriter MEDICARE  Plan: AARP MEDICARE COMPLETE  Product Type: *No Product type*    CC:    Chief Complaint  Patient presents with  . Atrial Fibrillation  . Hypertension    VS Filed Vitals:   07/22/12 1454  BP: 125/78  Pulse: 79  Height: 5\' 8"  (1.727 m)  Weight: 178 lb (80.74 kg)  SpO2: 95%    Weights Current Weight  07/22/12 178 lb (80.74 kg)  07/14/12 187 lb (84.823 kg)  07/08/12 182 lb 15.7 oz (83 kg)    Blood Pressure  BP Readings from Last 3 Encounters:  07/22/12 125/78  07/14/12 102/60  07/07/12 104/50     Admit date:  (Not on file) Last encounter with RMR:  07/18/2012   Allergy Ciprofloxacin  Current Outpatient Prescriptions  Medication Sig Dispense Refill  . diltiazem (CARDIZEM CD) 240 MG 24 hr capsule Take 1 capsule (240 mg total) by mouth daily.  30 capsule  12  . metoprolol (LOPRESSOR) 50 MG tablet Take 1 tablet (50 mg total) by mouth 2 (two) times daily.  60 tablet  12  . pantoprazole (PROTONIX) 40 MG tablet Take 1 tablet (40 mg total) by mouth daily.  30 tablet  12  . potassium chloride SA (K-DUR,KLOR-CON) 20 MEQ tablet Take 1 tablet (20 mEq total) by mouth daily.  30 tablet  6  . simvastatin (ZOCOR) 10 MG tablet Take 10 mg by mouth at bedtime.       No current facility-administered medications for this visit.    Discontinued Meds:   There are no discontinued medications.  Patient Active Problem List  Diagnosis  . Syncope  . Essential hypertension, benign  . CVA (cerebral infarction)  . GERD (gastroesophageal reflux disease)  . Noninfectious gastroenteritis and colitis  . Bradycardia  . Atrial fibrillation  . Rash and nonspecific skin eruption    LABS    Component Value Date/Time   NA 138 07/07/2012 0548   NA 133* 07/05/2012 0522   NA 135 07/03/2012 0951   K 3.9 07/07/2012 0548   K 4.2 07/05/2012 0522   K 4.4  07/03/2012 0951   CL 109 07/07/2012 0548   CL 104 07/05/2012 0522   CL 105 07/03/2012 0951   CO2 20 07/07/2012 0548   CO2 17* 07/05/2012 0522   CO2 21 07/03/2012 0951   GLUCOSE 102* 07/07/2012 0548   GLUCOSE 143* 07/05/2012 0522   GLUCOSE 114* 07/03/2012 0951   BUN 24* 07/07/2012 0548   BUN 23 07/05/2012 0522   BUN 26* 07/03/2012 0951   CREATININE 1.30* 07/07/2012 0548   CREATININE 1.41* 07/05/2012 0522   CREATININE 1.49* 07/03/2012 0951   CALCIUM 8.1* 07/07/2012 0548   CALCIUM 8.2* 07/05/2012 0522   CALCIUM 8.5 07/03/2012 0951   GFRNONAA 35* 07/07/2012 0548   GFRNONAA 31* 07/05/2012 0522   GFRNONAA 29* 07/03/2012 0951   GFRAA 40* 07/07/2012 0548   GFRAA 36* 07/05/2012 0522   GFRAA 34* 07/03/2012 0951   CMP     Component Value Date/Time   NA 138 07/07/2012 0548   K 3.9 07/07/2012 0548   CL 109 07/07/2012 0548   CO2 20 07/07/2012 0548   GLUCOSE 102* 07/07/2012 0548   BUN 24* 07/07/2012 0548   CREATININE 1.30* 07/07/2012 0548   CALCIUM 8.1* 07/07/2012 4098  PROT 6.2 07/03/2012 0951   ALBUMIN 2.8* 07/03/2012 0951   AST 14 07/03/2012 0951   ALT 9 07/03/2012 0951   ALKPHOS 81 07/03/2012 0951   BILITOT 0.7 07/03/2012 0951   GFRNONAA 35* 07/07/2012 0548   GFRAA 40* 07/07/2012 0548       Component Value Date/Time   WBC 9.1 07/07/2012 0548   WBC 14.6* 07/05/2012 0522   WBC 15.3* 07/03/2012 0951   HGB 9.9* 07/07/2012 0548   HGB 9.7* 07/05/2012 0522   HGB 10.5* 07/03/2012 0951   HCT 31.1* 07/07/2012 0548   HCT 29.7* 07/05/2012 0522   HCT 32.4* 07/03/2012 0951   MCV 78.7 07/07/2012 0548   MCV 78.6 07/05/2012 0522   MCV 78.8 07/03/2012 0951    Lipid Panel     Component Value Date/Time   CHOL  Value: 112        ATP III CLASSIFICATION:  <200     mg/dL   Desirable  469-629  mg/dL   Borderline High  >=528    mg/dL   High        41/06/2438 0100   TRIG 106 03/16/2009 0100   HDL 58 03/16/2009 0100   CHOLHDL 1.9 03/16/2009 0100   VLDL 21 03/16/2009 0100   LDLCALC  Value: 33        Total Cholesterol/HDL:CHD Risk Coronary  Heart Disease Risk Table                     Men   Women  1/2 Average Risk   3.4   3.3  Average Risk       5.0   4.4  2 X Average Risk   9.6   7.1  3 X Average Risk  23.4   11.0        Use the calculated Patient Ratio above and the CHD Risk Table to determine the patient's CHD Risk.        ATP III CLASSIFICATION (LDL):  <100     mg/dL   Optimal  102-725  mg/dL   Near or Above                    Optimal  130-159  mg/dL   Borderline  366-440  mg/dL   High  >347     mg/dL   Very High 42/08/9561 8756    ABG    Component Value Date/Time   TCO2 26 12/04/2008 1433     Lab Results  Component Value Date   TSH 6.563 ***Test methodology is 3rd generation TSH**** 03/16/2009   BNP (last 3 results) No results found for this basename: PROBNP,  in the last 8760 hours Cardiac Panel (last 3 results) No results found for this basename: CKTOTAL, CKMB, TROPONINI, RELINDX,  in the last 72 hours  Iron/TIBC/Ferritin    Component Value Date/Time   IRON 38* 06/22/2012 0921   TIBC 371 06/22/2012 0921   FERRITIN 9* 06/22/2012 0921     EKG Orders placed in visit on 07/14/12  . EKG 12-LEAD     Prior Assessment and Plan Problem List as of 07/22/2012     ICD-9-CM   Syncope   Essential hypertension, benign   Last Assessment & Plan   07/14/2012 Office Visit Written 07/14/2012  3:51 PM by Jodelle Gross, NP     Blood pressure is low normal on Cardizem 240 mg, with metoprolol 50 mg twice a day. May need to consider decreasing diltiazem if she becomes  hypotensive. She is very sedentary, and very deconditioned. A low normal blood pressure may be adequate for her at this time until her physical stamina returns. Close followup will see her again in one week.    CVA (cerebral infarction)   GERD (gastroesophageal reflux disease)   Noninfectious gastroenteritis and colitis   Bradycardia   Atrial fibrillation   Last Assessment & Plan   07/14/2012 Office Visit Written 07/14/2012  3:50 PM by Jodelle Gross, NP     Heart  rate is generally well-controlled. She appears to be having evidence of CHF. Expect to see some edema in the lower extremities with use of calcium channel blocker, but she has significant edema pretibially with associated wheezes and rhonchi. Will have a chest x-ray completed, give her Lasix for which she will take 40 mg this afternoon with 20 mEq of potassium. She will take 20 mg of Lasix in the morning and daily. She is due to see Dr. Ouida Sills in the morning. By that time further assessment can be made concerning her blood pressure along status. Chest x-ray should be available by then. Do not want to over diuresis her in the setting of hypotension. However with atrial fibrillation accuracy of blood pressure is in question.    Rash and nonspecific skin eruption       Imaging: Dg Chest 2 View  07/14/2012  *RADIOLOGY REPORT*  Clinical Data: CHF.  CHEST - 2 VIEW  Comparison: 06/21/2012  Findings: The heart is enlarged.  There are bilateral pleural effusions, left greater than right.  New left basilar and mid lung zone atelectasis are identified.  No evidence for acute pulmonary edema.  There are old right rib fractures.  IMPRESSION: Cardiomegaly and new bilateral effusions. New left base atelectasis.   Original Report Authenticated By: Norva Pavlov, M.D.

## 2012-07-22 NOTE — Assessment & Plan Note (Signed)
She has lost 9 lbs since being seen last with lasix 20 mg daily. I will repeat her BMET and CBC now that she has diuresed for evaluation of kidney fx and anemia which may have bene dilutional. I have requested records from Dr. Alonza Smoker office for review of her last appt with him.

## 2012-07-22 NOTE — Addendum Note (Signed)
Addended by: Cynda Acres A on: 07/22/2012 03:53 PM   Modules accepted: Orders

## 2012-07-22 NOTE — Assessment & Plan Note (Addendum)
Blood pressure is controllled at present. No changes in her current medication at this time. I have reviewed Dr. Alonza Smoker note which is just been sent over. He has restarted losartan 100 mg daily.

## 2012-07-22 NOTE — Progress Notes (Deleted)
Name: [name]    DOB: 05/03/1917  Age: 79 y.o.  MR#: [id number]       PCP:  Carylon Perches, MD      Insurance: Payor: [employer] MEDICARE  Plan: A[employer] MEDICARE COMPLETE  Product Type: *No Product type*    CC:    Chief Complaint  Patient presents with  . Atrial Fibrillation  . Hypertension    VS Filed Vitals:   07/22/12 1454  BP: 125/78  Pulse: 79  Height: 5\' 8"  (1.727 m)  Weight: 178 lb (80.74 kg)  SpO2: 95%    Weights Current Weight  07/22/12 178 lb (80.74 kg)  07/14/12 187 lb (84.823 kg)  07/08/12 182 lb 15.7 oz (83 kg)    Blood Pressure  BP Readings from Last 3 Encounters:  07/22/12 125/78  07/14/12 102/60  07/07/12 104/50     Admit date:  (Not on file) Last encounter with RMR:  07/18/2012   Allergy Ciprofloxacin  Current Outpatient Prescriptions  Medication Sig Dispense Refill  . diltiazem (CARDIZEM CD) 240 MG 24 hr capsule Take 1 capsule (240 mg total) by mouth daily.  30 capsule  12  . metoprolol (LOPRESSOR) 50 MG tablet Take 1 tablet (50 mg total) by mouth 2 (two) times daily.  60 tablet  12  . pantoprazole (PROTONIX) 40 MG tablet Take 1 tablet (40 mg total) by mouth daily.  30 tablet  12  . potassium chloride SA (K-DUR,KLOR-CON) 20 MEQ tablet Take 1 tablet (20 mEq total) by mouth daily.  30 tablet  6  . simvastatin (ZOCOR) 10 MG tablet Take 10 mg by mouth at bedtime.       No current facility-administered medications for this visit.    Discontinued Meds:   There are no discontinued medications.  Patient Active Problem List  Diagnosis  . Syncope  . Essential hypertension, benign  . CVA (cerebral infarction)  . GERD (gastroesophageal reflux disease)  . Noninfectious gastroenteritis and colitis  . Bradycardia  . Atrial fibrillation  . Rash and nonspecific skin eruption    LABS    Component Value Date/Time   NA 138 07/07/2012 0548   NA 133* 07/05/2012 0522   NA 135 07/03/2012 0951   K 3.9 07/07/2012 0548   K 4.2 07/05/2012 0522   K 4.4  07/03/2012 0951   CL 109 07/07/2012 0548   CL 104 07/05/2012 0522   CL 105 07/03/2012 0951   CO2 20 07/07/2012 0548   CO2 17* 07/05/2012 0522   CO2 21 07/03/2012 0951   GLUCOSE 102* 07/07/2012 0548   GLUCOSE 143* 07/05/2012 0522   GLUCOSE 114* 07/03/2012 0951   BUN 24* 07/07/2012 0548   BUN 23 07/05/2012 0522   BUN 26* 07/03/2012 0951   CREATININE 1.30* 07/07/2012 0548   CREATININE 1.41* 07/05/2012 0522   CREATININE 1.49* 07/03/2012 0951   CALCIUM 8.1* 07/07/2012 0548   CALCIUM 8.2* 07/05/2012 0522   CALCIUM 8.5 07/03/2012 0951   GFRNONAA 35* 07/07/2012 0548   GFRNONAA 31* 07/05/2012 0522   GFRNONAA 29* 07/03/2012 0951   GFRAA 40* 07/07/2012 0548   GFRAA 36* 07/05/2012 0522   GFRAA 34* 07/03/2012 0951   CMP     Component Value Date/Time   NA 138 07/07/2012 0548   K 3.9 07/07/2012 0548   CL 109 07/07/2012 0548   CO2 20 07/07/2012 0548   GLUCOSE 102* 07/07/2012 0548   BUN 24* 07/07/2012 0548   CREATININE 1.30* 07/07/2012 0548   CALCIUM 8.1* 07/07/2012 4098  PROT 6.2 07/03/2012 0951   ALBUMIN 2.8* 07/03/2012 0951   AST 14 07/03/2012 0951   ALT 9 07/03/2012 0951   ALKPHOS 81 07/03/2012 0951   BILITOT 0.7 07/03/2012 0951   GFRNONAA 35* 07/07/2012 0548   GFRAA 40* 07/07/2012 0548       Component Value Date/Time   WBC 9.1 07/07/2012 0548   WBC 14.6* 07/05/2012 0522   WBC 15.3* 07/03/2012 0951   HGB 9.9* 07/07/2012 0548   HGB 9.7* 07/05/2012 0522   HGB 10.5* 07/03/2012 0951   HCT 31.1* 07/07/2012 0548   HCT 29.7* 07/05/2012 0522   HCT 32.4* 07/03/2012 0951   MCV 78.7 07/07/2012 0548   MCV 78.6 07/05/2012 0522   MCV 78.8 07/03/2012 0951    Lipid Panel     Component Value Date/Time   CHOL  Value: 112        ATP III CLASSIFICATION:  <200     mg/dL   Desirable  161-096  mg/dL   Borderline High  >=045    mg/dL   High        40/12/8117 0100   TRIG 106 03/16/2009 0100   HDL 58 03/16/2009 0100   CHOLHDL 1.9 03/16/2009 0100   VLDL 21 03/16/2009 0100   LDLCALC  Value: 33        Total Cholesterol/HDL:CHD Risk Coronary  Heart Disease Risk Table                     Men   Women  1/2 Average Risk   3.4   3.3  Average Risk       5.0   4.4  2 X Average Risk   9.6   7.1  3 X Average Risk  23.4   11.0        Use the calculated Patient Ratio above and the CHD Risk Table to determine the patient's CHD Risk.        ATP III CLASSIFICATION (LDL):  <100     mg/dL   Optimal  147-829  mg/dL   Near or Above                    Optimal  130-159  mg/dL   Borderline  562-130  mg/dL   High  >865     mg/dL   Very High 78/07/6960 9528    ABG    Component Value Date/Time   TCO2 26 12/04/2008 1433     Lab Results  Component Value Date   TSH 6.563 ***Test methodology is 3rd generation TSH**** 03/16/2009   BNP (last 3 results) No results found for this basename: PROBNP,  in the last 8760 hours Cardiac Panel (last 3 results) No results found for this basename: CKTOTAL, CKMB, TROPONINI, RELINDX,  in the last 72 hours  Iron/TIBC/Ferritin    Component Value Date/Time   IRON 38* 06/22/2012 0921   TIBC 371 06/22/2012 0921   FERRITIN 9* 06/22/2012 0921     EKG Orders placed in visit on 07/14/12  . EKG 12-LEAD     Prior Assessment and Plan Problem List as of 07/22/2012     ICD-9-CM     Cardiology Problems   Syncope   Essential hypertension, benign   Last Assessment & Plan   07/14/2012 Office Visit Written 07/14/2012  3:51 PM by Jodelle Gross, NP     Blood pressure is low normal on Cardizem 240 mg, with metoprolol 50 mg twice a day. May need to  consider decreasing diltiazem if she becomes hypotensive. She is very sedentary, and very deconditioned. A low normal blood pressure may be adequate for her at this time until her physical stamina returns. Close followup will see her again in one week.    Atrial fibrillation   Last Assessment & Plan   07/14/2012 Office Visit Written 07/14/2012  3:50 PM by Jodelle Gross, NP     Heart rate is generally well-controlled. She appears to be having evidence of CHF. Expect to see some edema in  the lower extremities with use of calcium channel blocker, but she has significant edema pretibially with associated wheezes and rhonchi. Will have a chest x-ray completed, give her Lasix for which she will take 40 mg this afternoon with 20 mEq of potassium. She will take 20 mg of Lasix in the morning and daily. She is due to see Dr. Ouida Sills in the morning. By that time further assessment can be made concerning her blood pressure along status. Chest x-ray should be available by then. Do not want to over diuresis her in the setting of hypotension. However with atrial fibrillation accuracy of blood pressure is in question.      Other   CVA (cerebral infarction)   GERD (gastroesophageal reflux disease)   Noninfectious gastroenteritis and colitis   Bradycardia   Rash and nonspecific skin eruption       Imaging: Dg Chest 2 View  07/14/2012  *RADIOLOGY REPORT*  Clinical Data: CHF.  CHEST - 2 VIEW  Comparison: 06/21/2012  Findings: The heart is enlarged.  There are bilateral pleural effusions, left greater than right.  New left basilar and mid lung zone atelectasis are identified.  No evidence for acute pulmonary edema.  There are old right rib fractures.  IMPRESSION: Cardiomegaly and new bilateral effusions. New left base atelectasis.   Original Report Authenticated By: Norva Pavlov, M.D.

## 2012-07-23 ENCOUNTER — Ambulatory Visit: Payer: Medicare Other | Admitting: Adult Health

## 2012-07-23 LAB — CBC
HCT: 33.7 % — ABNORMAL LOW (ref 36.0–46.0)
MCH: 23.3 pg — ABNORMAL LOW (ref 26.0–34.0)
MCV: 77.6 fL — ABNORMAL LOW (ref 78.0–100.0)
Platelets: 363 10*3/uL (ref 150–400)
RDW: 17.9 % — ABNORMAL HIGH (ref 11.5–15.5)
WBC: 7.9 10*3/uL (ref 4.0–10.5)

## 2012-07-23 LAB — BASIC METABOLIC PANEL
BUN: 17 mg/dL (ref 6–23)
CO2: 25 mEq/L (ref 19–32)
Chloride: 104 mEq/L (ref 96–112)
Glucose, Bld: 103 mg/dL — ABNORMAL HIGH (ref 70–99)
Potassium: 4.5 mEq/L (ref 3.5–5.3)
Sodium: 139 mEq/L (ref 135–145)

## 2012-08-21 ENCOUNTER — Encounter: Payer: Self-pay | Admitting: Cardiology

## 2012-08-21 ENCOUNTER — Ambulatory Visit: Payer: Medicare Other | Admitting: Cardiology

## 2012-08-21 DIAGNOSIS — K219 Gastro-esophageal reflux disease without esophagitis: Secondary | ICD-10-CM | POA: Insufficient documentation

## 2012-08-21 DIAGNOSIS — I2510 Atherosclerotic heart disease of native coronary artery without angina pectoris: Secondary | ICD-10-CM | POA: Insufficient documentation

## 2012-08-21 DIAGNOSIS — I5032 Chronic diastolic (congestive) heart failure: Secondary | ICD-10-CM | POA: Insufficient documentation

## 2012-08-21 DIAGNOSIS — Z8673 Personal history of transient ischemic attack (TIA), and cerebral infarction without residual deficits: Secondary | ICD-10-CM | POA: Insufficient documentation

## 2012-08-21 DIAGNOSIS — I10 Essential (primary) hypertension: Secondary | ICD-10-CM | POA: Insufficient documentation

## 2012-08-21 DIAGNOSIS — E785 Hyperlipidemia, unspecified: Secondary | ICD-10-CM | POA: Insufficient documentation

## 2012-12-30 ENCOUNTER — Encounter: Payer: Self-pay | Admitting: *Deleted

## 2013-06-03 ENCOUNTER — Other Ambulatory Visit: Payer: Self-pay | Admitting: Adult Health

## 2013-06-03 NOTE — Telephone Encounter (Signed)
Contact office for appointment.

## 2013-07-15 ENCOUNTER — Other Ambulatory Visit: Payer: Self-pay | Admitting: Adult Health

## 2013-08-14 ENCOUNTER — Other Ambulatory Visit: Payer: Self-pay | Admitting: Adult Health

## 2014-03-12 ENCOUNTER — Other Ambulatory Visit: Payer: Self-pay | Admitting: Adult Health

## 2014-04-05 ENCOUNTER — Other Ambulatory Visit: Payer: Self-pay | Admitting: Adult Health

## 2014-04-16 ENCOUNTER — Other Ambulatory Visit: Payer: Self-pay | Admitting: Adult Health

## 2014-04-24 ENCOUNTER — Other Ambulatory Visit: Payer: Self-pay | Admitting: Adult Health

## 2014-04-26 ENCOUNTER — Encounter: Payer: Self-pay | Admitting: *Deleted

## 2014-05-10 DIAGNOSIS — Z79899 Other long term (current) drug therapy: Secondary | ICD-10-CM | POA: Diagnosis not present

## 2014-05-17 DIAGNOSIS — I4891 Unspecified atrial fibrillation: Secondary | ICD-10-CM | POA: Diagnosis not present

## 2014-05-17 DIAGNOSIS — I5032 Chronic diastolic (congestive) heart failure: Secondary | ICD-10-CM | POA: Diagnosis not present

## 2014-05-17 DIAGNOSIS — I10 Essential (primary) hypertension: Secondary | ICD-10-CM | POA: Diagnosis not present

## 2014-08-10 ENCOUNTER — Inpatient Hospital Stay (HOSPITAL_COMMUNITY): Payer: Medicare Other

## 2014-08-10 ENCOUNTER — Encounter (HOSPITAL_COMMUNITY): Payer: Self-pay

## 2014-08-10 ENCOUNTER — Observation Stay (HOSPITAL_COMMUNITY)
Admission: EM | Admit: 2014-08-10 | Discharge: 2014-08-13 | Disposition: A | Discharge status: Home / Self Care | Payer: Medicare Other | Attending: Internal Medicine | Admitting: Internal Medicine

## 2014-08-10 ENCOUNTER — Emergency Department (HOSPITAL_COMMUNITY): Payer: Medicare Other

## 2014-08-10 DIAGNOSIS — Z79899 Other long term (current) drug therapy: Secondary | ICD-10-CM | POA: Diagnosis not present

## 2014-08-10 DIAGNOSIS — R1032 Left lower quadrant pain: Secondary | ICD-10-CM | POA: Diagnosis not present

## 2014-08-10 DIAGNOSIS — R102 Pelvic and perineal pain: Secondary | ICD-10-CM | POA: Diagnosis not present

## 2014-08-10 DIAGNOSIS — I509 Heart failure, unspecified: Secondary | ICD-10-CM | POA: Diagnosis not present

## 2014-08-10 DIAGNOSIS — N281 Cyst of kidney, acquired: Secondary | ICD-10-CM | POA: Diagnosis not present

## 2014-08-10 DIAGNOSIS — I2510 Atherosclerotic heart disease of native coronary artery without angina pectoris: Secondary | ICD-10-CM | POA: Diagnosis not present

## 2014-08-10 DIAGNOSIS — E785 Hyperlipidemia, unspecified: Secondary | ICD-10-CM | POA: Diagnosis not present

## 2014-08-10 DIAGNOSIS — Z8673 Personal history of transient ischemic attack (TIA), and cerebral infarction without residual deficits: Secondary | ICD-10-CM | POA: Diagnosis not present

## 2014-08-10 DIAGNOSIS — I482 Chronic atrial fibrillation: Secondary | ICD-10-CM | POA: Diagnosis not present

## 2014-08-10 DIAGNOSIS — N390 Urinary tract infection, site not specified: Principal | ICD-10-CM | POA: Diagnosis present

## 2014-08-10 DIAGNOSIS — I10 Essential (primary) hypertension: Secondary | ICD-10-CM | POA: Diagnosis not present

## 2014-08-10 DIAGNOSIS — M25572 Pain in left ankle and joints of left foot: Secondary | ICD-10-CM

## 2014-08-10 DIAGNOSIS — M25552 Pain in left hip: Secondary | ICD-10-CM | POA: Diagnosis present

## 2014-08-10 DIAGNOSIS — M79605 Pain in left leg: Secondary | ICD-10-CM | POA: Diagnosis not present

## 2014-08-10 DIAGNOSIS — I252 Old myocardial infarction: Secondary | ICD-10-CM | POA: Diagnosis not present

## 2014-08-10 DIAGNOSIS — D8689 Sarcoidosis of other sites: Secondary | ICD-10-CM | POA: Diagnosis not present

## 2014-08-10 DIAGNOSIS — K219 Gastro-esophageal reflux disease without esophagitis: Secondary | ICD-10-CM | POA: Insufficient documentation

## 2014-08-10 DIAGNOSIS — I4891 Unspecified atrial fibrillation: Secondary | ICD-10-CM | POA: Diagnosis not present

## 2014-08-10 DIAGNOSIS — R1030 Lower abdominal pain, unspecified: Secondary | ICD-10-CM

## 2014-08-10 DIAGNOSIS — M25462 Effusion, left knee: Secondary | ICD-10-CM

## 2014-08-10 DIAGNOSIS — K449 Diaphragmatic hernia without obstruction or gangrene: Secondary | ICD-10-CM | POA: Diagnosis not present

## 2014-08-10 DIAGNOSIS — R52 Pain, unspecified: Secondary | ICD-10-CM | POA: Diagnosis not present

## 2014-08-10 DIAGNOSIS — R109 Unspecified abdominal pain: Secondary | ICD-10-CM

## 2014-08-10 LAB — CBC WITH DIFFERENTIAL/PLATELET
BASOS PCT: 0 % (ref 0–1)
Basophils Absolute: 0 10*3/uL (ref 0.0–0.1)
Eosinophils Absolute: 0 10*3/uL (ref 0.0–0.7)
Eosinophils Relative: 0 % (ref 0–5)
HCT: 37.4 % (ref 36.0–46.0)
Hemoglobin: 11.7 g/dL — ABNORMAL LOW (ref 12.0–15.0)
LYMPHS ABS: 0.8 10*3/uL (ref 0.7–4.0)
Lymphocytes Relative: 9 % — ABNORMAL LOW (ref 12–46)
MCH: 26.9 pg (ref 26.0–34.0)
MCHC: 31.3 g/dL (ref 30.0–36.0)
MCV: 86 fL (ref 78.0–100.0)
Monocytes Absolute: 1.2 10*3/uL — ABNORMAL HIGH (ref 0.1–1.0)
Monocytes Relative: 14 % — ABNORMAL HIGH (ref 3–12)
NEUTROS PCT: 77 % (ref 43–77)
Neutro Abs: 6.8 10*3/uL (ref 1.7–7.7)
PLATELETS: 255 10*3/uL (ref 150–400)
RBC: 4.35 MIL/uL (ref 3.87–5.11)
RDW: 14.5 % (ref 11.5–15.5)
WBC: 8.9 10*3/uL (ref 4.0–10.5)

## 2014-08-10 LAB — URINALYSIS, ROUTINE W REFLEX MICROSCOPIC
GLUCOSE, UA: NEGATIVE mg/dL
Ketones, ur: NEGATIVE mg/dL
LEUKOCYTES UA: NEGATIVE
Nitrite: POSITIVE — AB
PH: 5.5 (ref 5.0–8.0)
Protein, ur: 100 mg/dL — AB
Urobilinogen, UA: 1 mg/dL (ref 0.0–1.0)

## 2014-08-10 LAB — COMPREHENSIVE METABOLIC PANEL
ALT: 16 U/L (ref 14–54)
AST: 27 U/L (ref 15–41)
Albumin: 3.3 g/dL — ABNORMAL LOW (ref 3.5–5.0)
Alkaline Phosphatase: 99 U/L (ref 38–126)
Anion gap: 9 (ref 5–15)
BUN: 23 mg/dL — AB (ref 6–20)
CO2: 28 mmol/L (ref 22–32)
CREATININE: 1.34 mg/dL — AB (ref 0.44–1.00)
Calcium: 8.5 mg/dL — ABNORMAL LOW (ref 8.9–10.3)
Chloride: 103 mmol/L (ref 101–111)
GFR, EST AFRICAN AMERICAN: 38 mL/min — AB (ref >60)
GFR, EST NON AFRICAN AMERICAN: 33 mL/min — AB (ref >60)
GLUCOSE: 140 mg/dL — AB (ref 70–99)
Potassium: 3.6 mmol/L (ref 3.5–5.1)
Sodium: 140 mmol/L (ref 135–145)
Total Bilirubin: 1.2 mg/dL (ref 0.3–1.2)
Total Protein: 7.2 g/dL (ref 6.5–8.1)

## 2014-08-10 LAB — URINE MICROSCOPIC-ADD ON

## 2014-08-10 LAB — LIPASE, BLOOD: Lipase: 23 U/L (ref 22–51)

## 2014-08-10 MED ORDER — DEXTROSE 5 % IV SOLN
1.0000 g | INTRAVENOUS | Status: DC
  Administered 2014-08-11 – 2014-08-12 (×2): 1 g via INTRAVENOUS
  Filled 2014-08-10 (×5): qty 10

## 2014-08-10 MED ORDER — DILTIAZEM HCL ER COATED BEADS 240 MG PO CP24
240.0000 mg | ORAL_CAPSULE | Freq: Every day | ORAL | Status: DC
  Administered 2014-08-11 – 2014-08-13 (×3): 240 mg via ORAL
  Filled 2014-08-10 (×5): qty 1

## 2014-08-10 MED ORDER — DEXTROSE 5 % IV SOLN: 1.0000 g | INTRAVENOUS | Status: DC

## 2014-08-10 MED ORDER — HYDROMORPHONE HCL 1 MG/ML IJ SOLN
1.0000 mg | Freq: Once | INTRAMUSCULAR | Status: AC
  Administered 2014-08-10: 1 mg via INTRAVENOUS
  Filled 2014-08-10: qty 1

## 2014-08-10 MED ORDER — ONDANSETRON HCL 4 MG/2ML IJ SOLN
4.0000 mg | Freq: Once | INTRAMUSCULAR | Status: AC
  Administered 2014-08-10: 4 mg via INTRAVENOUS
  Filled 2014-08-10: qty 2

## 2014-08-10 MED ORDER — FUROSEMIDE 20 MG PO TABS
20.0000 mg | ORAL_TABLET | Freq: Every day | ORAL | Status: DC
  Administered 2014-08-11 – 2014-08-13 (×3): 20 mg via ORAL
  Filled 2014-08-10 (×3): qty 1

## 2014-08-10 MED ORDER — HYDROMORPHONE HCL 1 MG/ML IJ SOLN: 1.0000 mg | Freq: Once | INTRAMUSCULAR | Status: DC

## 2014-08-10 MED ORDER — LOSARTAN POTASSIUM 50 MG PO TABS
100.0000 mg | ORAL_TABLET | Freq: Every day | ORAL | Status: DC
  Administered 2014-08-11 – 2014-08-13 (×3): 100 mg via ORAL
  Filled 2014-08-10 (×5): qty 2

## 2014-08-10 MED ORDER — SODIUM CHLORIDE 0.9 % IV SOLN
INTRAVENOUS | Status: DC
  Administered 2014-08-10: 20:00:00 via INTRAVENOUS

## 2014-08-10 MED ORDER — ONDANSETRON HCL 4 MG/2ML IJ SOLN: 4.0000 mg | Freq: Four times a day (QID) | INTRAMUSCULAR | Status: DC | PRN

## 2014-08-10 MED ORDER — SODIUM CHLORIDE 0.9 % IV BOLUS (SEPSIS)
500.0000 mL | Freq: Once | INTRAVENOUS | Status: AC
  Administered 2014-08-10: 500 mL via INTRAVENOUS

## 2014-08-10 MED ORDER — METOPROLOL TARTRATE 50 MG PO TABS
50.0000 mg | ORAL_TABLET | Freq: Two times a day (BID) | ORAL | Status: DC
  Administered 2014-08-10 – 2014-08-13 (×6): 50 mg via ORAL
  Filled 2014-08-10 (×6): qty 1

## 2014-08-10 MED ORDER — HYDROMORPHONE HCL 1 MG/ML IJ SOLN
0.5000 mg | Freq: Once | INTRAMUSCULAR | Status: AC
  Administered 2014-08-10: 0.5 mg via INTRAVENOUS
  Filled 2014-08-10: qty 1

## 2014-08-10 MED ORDER — ONDANSETRON HCL 4 MG/2ML IJ SOLN
4.0000 mg | Freq: Once | INTRAMUSCULAR | Status: DC
  Filled 2014-08-10: qty 2

## 2014-08-10 MED ORDER — ONDANSETRON HCL 4 MG PO TABS: 4.0000 mg | ORAL_TABLET | Freq: Four times a day (QID) | ORAL | Status: DC | PRN

## 2014-08-10 MED ORDER — PANTOPRAZOLE SODIUM 40 MG PO TBEC: 40.0000 mg | DELAYED_RELEASE_TABLET | Freq: Every day | ORAL | Status: DC

## 2014-08-10 MED ORDER — PANTOPRAZOLE SODIUM 40 MG PO TBEC
40.0000 mg | DELAYED_RELEASE_TABLET | Freq: Every day | ORAL | Status: DC
  Administered 2014-08-11 – 2014-08-13 (×3): 40 mg via ORAL
  Filled 2014-08-10 (×3): qty 1

## 2014-08-10 MED ORDER — ONDANSETRON HCL 4 MG/2ML IJ SOLN
4.0000 mg | Freq: Once | INTRAMUSCULAR | Status: AC
  Administered 2014-08-10: 4 mg via INTRAVENOUS

## 2014-08-10 MED ORDER — HEPARIN SODIUM (PORCINE) 5000 UNIT/ML IJ SOLN
5000.0000 [IU] | Freq: Three times a day (TID) | INTRAMUSCULAR | Status: DC
  Administered 2014-08-10 – 2014-08-13 (×8): 5000 [IU] via SUBCUTANEOUS
  Filled 2014-08-10 (×6): qty 1

## 2014-08-10 MED ORDER — FUROSEMIDE 20 MG PO TABS: 20.0000 mg | ORAL_TABLET | Freq: Every day | ORAL | Status: DC

## 2014-08-10 MED ORDER — DEXTROSE 5 % IV SOLN
1.0000 g | Freq: Once | INTRAVENOUS | Status: AC
  Administered 2014-08-10: 1 g via INTRAVENOUS
  Filled 2014-08-10: qty 10

## 2014-08-10 MED ORDER — SIMVASTATIN 10 MG PO TABS
10.0000 mg | ORAL_TABLET | Freq: Every day | ORAL | Status: DC
  Administered 2014-08-10 – 2014-08-12 (×3): 10 mg via ORAL
  Filled 2014-08-10 (×3): qty 1

## 2014-08-10 NOTE — ED Notes (Signed)
Pt was given morphine 4 mg prior to arrival. Pt states the med did not help

## 2014-08-10 NOTE — ED Notes (Signed)
Pt complain of pain in left groin area. States it started hurting last night. Denies any injury

## 2014-08-10 NOTE — ED Notes (Signed)
Pt states she feels drunk. Complain of nausea and having dry heaves

## 2014-08-10 NOTE — ED Notes (Signed)
Pt unable to void at this time. 

## 2014-08-10 NOTE — H&P (Signed)
[facility] History and Physical  Dawn AllanGeraldine F Gibson ZOX:[id number] DOB: 01/05/1918 DOA: 08/10/2014  Referring physician: ER, Dr. Juleen ChinaKohut PCP: Carylon PerchesFAGAN,ROY, MD   Chief Complaint: Left lower abdominal pain.  HPI: [name] is a 81 y.o. female  This is a 81 year old lady who is normally functional and lives by herself, presents with left lower abdominal pain. She denies fever. There is no dysuria or any other urinary symptoms. There is no nausea or vomiting. She is now being admitted for further investigation and management. Initial evaluation shows urinalysis consistent with UTI.   Review of Systems:  Apart from symptoms above, all systems negative.  Past Medical History  Diagnosis Date  . Hypertension     Nl renal arteriography in 2001  . Stroke 2000 and    2010: Left MCA; negative carotid ultrasound; supraclinoid aneurysm bilaterally; presumed embolic from AF  . Congestive heart failure     06/2012 Echo: Moderate to severe LVH, nl EF, moderate LAE; mild to moderate pulmonary hypertension  . Gastroesophageal reflux disease   . Hyperlipidemia     Lipid profile 2010:112, 106, 58, 33  . PONV (postoperative nausea and vomiting)   . Syncope 2003, 2014    Attributed to UTI in 2003, acute gastroenteritis in 2014  . Ischemic colitis 2006  . Arteriosclerotic cardiovascular disease (ASCVD)     H/o MI; cath in 2001:30-40% LAD, nl Cx, TO RCA with collaterals, EF-70% with inferior hypokinesis; repeat cath in 2002-unchanged; 2006: Neg stress nuclear  . Diverticulitis     Extensive diverticulosis in 2006  . Atrial fibrillation     No anticoagulation  . Nodule of right lung    Past Surgical History  Procedure Laterality Date  . Knee surgery    . Colonoscopy  2006    Dr. Karilyn Cotaehman: ischemic colitis involving descending colon, extensive diverticula  . Esophagogastroduodenoscopy (egd) with esophageal dilation N/A 06/24/2012    Procedure: ESOPHAGOGASTRODUODENOSCOPY (EGD) WITH ESOPHAGEAL  DILATION;  Surgeon: Corbin Adeobert M Rourk, MD;  Location: [facility];  Service: Endoscopy;  Laterality: N/A;   Social History:  reports that she has never smoked. She does not have any smokeless tobacco history on file. She reports that she does not drink alcohol or use illicit drugs.  Allergies  Allergen Reactions  . Ciprofloxacin Rash    Family History  Problem Relation Age of Onset  . Cancer Mother   . Cancer Sister   . Heart attack Father   . Colon cancer Neg Hx     Prior to Admission medications   Medication Sig Start Date End Date Taking? Authorizing Provider  diltiazem (CARDIZEM CD) 240 MG 24 hr capsule Take 1 capsule (240 mg total) by mouth daily. Patient taking differently: Take 120 mg by mouth 2 (two) times daily.  07/08/12  Yes Carylon Perchesoy Fagan, MD  furosemide (LASIX) 20 MG tablet Take 20 mg by mouth daily.  06/20/12  Yes Historical Provider, MD  losartan (COZAAR) 100 MG tablet Take 100 mg by mouth daily.  06/20/12  Yes Historical Provider, MD  metoprolol (LOPRESSOR) 50 MG tablet Take 1 tablet (50 mg total) by mouth 2 (two) times daily. 07/08/12  Yes Carylon Perchesoy Fagan, MD  pantoprazole (PROTONIX) 40 MG tablet Take 1 tablet (40 mg total) by mouth daily. 07/08/12  Yes Carylon Perchesoy Fagan, MD  simvastatin (ZOCOR) 10 MG tablet Take 10 mg by mouth at bedtime.   Yes Historical Provider, MD  KLOR-CON M20 20 MEQ tablet TAKE 1 TABLET (20 MEQ TOTAL) BY MOUTH DAILY.  Patient not taking: Reported on 08/10/2014 04/26/14   Jodelle Gross, NP   Physical Exam: Filed Vitals:   08/10/14 1400 08/10/14 1530 08/10/14 1632 08/10/14 1826  BP: 132/66 160/83 146/71 128/77  Pulse:   66 83  Temp:      TempSrc:      Resp:   16 16  Height:      SpO2: 94%  97% 97%    Wt Readings from Last 3 Encounters:  07/22/12 80.74 kg (178 lb)  07/14/12 84.823 kg (187 lb)  07/08/12 83 kg (182 lb 15.7 oz)    General:  Appears calm and comfortable. She does not look septic or toxic. Eyes: PERRL, normal lids, irises & conjunctiva ENT:  grossly normal hearing, lips & tongue Neck: no LAD, masses or thyromegaly Cardiovascular: Irregularly irregular, consistent with atrial fibrillation. Telemetry: Atrial fibrillation.  Respiratory: CTA bilaterally, no w/r/r. Normal respiratory effort. Abdomen: soft, abdomen is not critically tender. Skin: no rash or induration seen on limited exam Musculoskeletal: She is lying on her left side and at the present time unwilling for me to examine her thoroughly. Psychiatric: grossly normal mood and affect, speech fluent and appropriate Neurologic: grossly non-focal.          Labs on Admission:  Basic Metabolic Panel:  Recent Labs Lab 08/10/14 1216  NA 140  K 3.6  CL 103  CO2 28  GLUCOSE 140*  BUN 23*  CREATININE 1.34*  CALCIUM 8.5*   Liver Function Tests:  Recent Labs Lab 08/10/14 1216  AST 27  ALT 16  ALKPHOS 99  BILITOT 1.2  PROT 7.2  ALBUMIN 3.3*    Recent Labs Lab 08/10/14 1216  LIPASE 23   No results for input(s): AMMONIA in the last 168 hours. CBC:  Recent Labs Lab 08/10/14 1216  WBC 8.9  NEUTROABS 6.8  HGB 11.7*  HCT 37.4  MCV 86.0  PLT 255   Cardiac Enzymes: No results for input(s): CKTOTAL, CKMB, CKMBINDEX, TROPONINI in the last 168 hours.  BNP (last 3 results) No results for input(s): BNP in the last 8760 hours.  ProBNP (last 3 results) No results for input(s): PROBNP in the last 8760 hours.  CBG: No results for input(s): GLUCAP in the last 168 hours.  Radiological Exams on Admission: Ct Abdomen Pelvis Wo Contrast  08/10/2014   CLINICAL DATA:  Abdominal and left inguinal region pain  EXAM: CT ABDOMEN AND PELVIS WITHOUT CONTRAST  TECHNIQUE: Multidetector CT imaging of the abdomen and pelvis was performed following the standard protocol without IV contrast. The patient did receive oral contrast.  COMPARISON:  December 04, 2008  FINDINGS: There is atelectatic change in the lung bases, more on the right than on the left. There is no frank  airspace consolidation in the bases. There is a sizable paraesophageal hernia with much of the stomach above the diaphragm. There are foci of coronary artery calcification as well as calcification in the mitral annulus.  There is a small calcified granuloma in the right lobe of the liver. No other focal liver lesions are identified on this noncontrast enhanced study. The gallbladder is absent. There is no appreciable biliary duct dilatation.  Spleen is normal in size and contour. There is calcification in the splenic artery. Pancreas is somewhat atrophic without mass or inflammation. Adrenals appear within normal limits bilaterally.  There is a 6.5 x 6.5 cm cyst arising from the upper pole of the left kidney. A second cyst arising from the upper pole of the  left kidney measures 1.4 x 1.3 cm. There is no hydronephrosis on either side. There is no renal or ureteral calculus on either side.  In the pelvis, the urinary bladder is midline with normal wall thickness. There are scattered sigmoid diverticula without diverticulitis. There is no pelvic mass or pelvic fluid collection. There is no periappendiceal region inflammation. No inguinal/periinguinal lesions are appreciated on either side.  There is extensive atherosclerotic change in the aorta. The aorta is tortuous. There is slight dilatation in the mid aortic region with a maximum transverse diameter of 3.1 x 2.8 cm. There is no periaortic fluid.  There is no bowel obstruction. No free air or portal venous air. There is no appreciable ascites, adenopathy, or abscess in the abdomen or pelvis. There is degenerative change in the lumbar spine. There are no blastic or lytic bone lesions. There is lumbar levoscoliosis.  IMPRESSION: There is a sizable paraesophageal type hernia, stable.  Large left renal cysts, present previously.  No bowel obstruction. No abscess. No periappendiceal region inflammation. No renal or ureteral calculus. No hydronephrosis. There are  scattered sigmoid diverticula without diverticulitis. There is extensive atherosclerotic change. There is mild dilatation in the mid abdominal aorta, a finding that was also present on prior study.   Electronically Signed   By: Bretta Bang III M.D.   On: 08/10/2014 17:09      Assessment/Plan   1. UTI. She'll be started on intravenous Rocephin. 2. Possible left hip pain. I will x-ray the pelvis for further evaluation. 3. Chronic atrial fibrillation. Controlled. 4. Hypertension. Controlled.  Further recommendations will depend on patient's hospital progress.   Code Status: Full code.   DVT Prophylaxis: Heparin.  Family Communication: I discussed the plan with the patient and family at the bedside.   Disposition Plan: Home when medically stable.   Time spent: 60 mins.  Wilson Singer [facility] Pager [phone].

## 2014-08-10 NOTE — ED Provider Notes (Signed)
CSN: [id number]     Arrival date & time 08/10/14  1112 History   First MD Initiated Contact with Patient 08/10/14 1303     Chief Complaint  Patient presents with  . Groin Pain     (Consider location/radiation/quality/duration/timing/severity/associated sxs/prior Treatment) HPI   89yF with LLQ pain. Gradual onset last night and progressively worsening. Constant and worse with movement. Associated n/v. No fever. Denies trauma. Denies urinary complaints. No diarrhea. No sick contacts. Received morphine by EMS.   Past Medical History  Diagnosis Date  . Hypertension     Nl renal arteriography in 2001  . Stroke 2000 and    2010: Left MCA; negative carotid ultrasound; supraclinoid aneurysm bilaterally; presumed embolic from AF  . Congestive heart failure     06/2012 Echo: Moderate to severe LVH, nl EF, moderate LAE; mild to moderate pulmonary hypertension  . Gastroesophageal reflux disease   . Hyperlipidemia     Lipid profile 2010:112, 106, 58, 33  . PONV (postoperative nausea and vomiting)   . Syncope 2003, 2014    Attributed to UTI in 2003, acute gastroenteritis in 2014  . Ischemic colitis 2006  . Arteriosclerotic cardiovascular disease (ASCVD)     H/o MI; cath in 2001:30-40% LAD, nl Cx, TO RCA with collaterals, EF-70% with inferior hypokinesis; repeat cath in 2002-unchanged; 2006: Neg stress nuclear  . Diverticulitis     Extensive diverticulosis in 2006  . Atrial fibrillation     No anticoagulation  . Nodule of right lung    Past Surgical History  Procedure Laterality Date  . Knee surgery    . Colonoscopy  2006    Dr. Karilyn Cotaehman: ischemic colitis involving descending colon, extensive diverticula  . Esophagogastroduodenoscopy (egd) with esophageal dilation N/A 06/24/2012    Procedure: ESOPHAGOGASTRODUODENOSCOPY (EGD) WITH ESOPHAGEAL DILATION;  Surgeon: Corbin Adeobert M Rourk, MD;  Location: [facility];  Service: Endoscopy;  Laterality: N/A;   Family History  Problem Relation Age of Onset   . Cancer Mother   . Cancer Sister   . Heart attack Father   . Colon cancer Neg Hx    History  Substance Use Topics  . Smoking status: Never Smoker   . Smokeless tobacco: Not on file  . Alcohol Use: No   OB History    No data available     Review of Systems  All systems reviewed and negative, other than as noted in HPI.   Allergies  Ciprofloxacin  Home Medications   Prior to Admission medications   Medication Sig Start Date End Date Taking? Authorizing Provider  diltiazem (CARDIZEM CD) 240 MG 24 hr capsule Take 1 capsule (240 mg total) by mouth daily. Patient taking differently: Take 120 mg by mouth 2 (two) times daily.  07/08/12  Yes Carylon Perchesoy Fagan, MD  furosemide (LASIX) 20 MG tablet Take 20 mg by mouth daily.  06/20/12  Yes Historical Provider, MD  losartan (COZAAR) 100 MG tablet Take 100 mg by mouth daily.  06/20/12  Yes Historical Provider, MD  metoprolol (LOPRESSOR) 50 MG tablet Take 1 tablet (50 mg total) by mouth 2 (two) times daily. 07/08/12  Yes Carylon Perchesoy Fagan, MD  pantoprazole (PROTONIX) 40 MG tablet Take 1 tablet (40 mg total) by mouth daily. 07/08/12  Yes Carylon Perchesoy Fagan, MD  simvastatin (ZOCOR) 10 MG tablet Take 10 mg by mouth at bedtime.   Yes Historical Provider, MD  KLOR-CON M20 20 MEQ tablet TAKE 1 TABLET (20 MEQ TOTAL) BY MOUTH DAILY. Patient not taking: Reported on 08/10/2014  04/26/14   Jodelle Gross, NP   BP 146/71 mmHg  Pulse 66  Temp(Src) 98.2 F (36.8 C) (Oral)  Resp 16  Ht  (1.753 m)  SpO2 97% Physical Exam  Constitutional: She appears well-developed and well-nourished. No distress.  HENT:  Head: Normocephalic and atraumatic.  Eyes: Conjunctivae are normal. Right eye exhibits no discharge. Left eye exhibits no discharge.  Neck: Neck supple.  Cardiovascular: Normal rate, regular rhythm and normal heart sounds.  Exam reveals no gallop and no friction rub.   No murmur heard. Pulmonary/Chest: Effort normal and breath sounds normal. No respiratory distress.   Abdominal: Soft. She exhibits no distension. There is tenderness. There is no rebound and no guarding.  TTP LLQ w/o rebound or guarding. No CVA tenderness.   Musculoskeletal: She exhibits no edema or tenderness.  Pain with ROM of L hip. NVI distally. Non TTP.   Neurological: She is alert.  Skin: Skin is warm and dry.  Psychiatric: She has a normal mood and affect. Her behavior is normal. Thought content normal.  Nursing note and vitals reviewed.   ED Course  Procedures (including critical care time) Labs Review Labs Reviewed  CBC WITH DIFFERENTIAL/PLATELET - Abnormal; Notable for the following:    Hemoglobin 11.7 (*)    Lymphocytes Relative 9 (*)    Monocytes Relative 14 (*)    Monocytes Absolute 1.2 (*)    All other components within normal limits  COMPREHENSIVE METABOLIC PANEL - Abnormal; Notable for the following:    Glucose, Bld 140 (*)    BUN 23 (*)    Creatinine, Ser 1.34 (*)    Calcium 8.5 (*)    Albumin 3.3 (*)    GFR calc non Af Amer 33 (*)    GFR calc Af Amer 38 (*)    All other components within normal limits  URINALYSIS, ROUTINE W REFLEX MICROSCOPIC - Abnormal; Notable for the following:    Specific Gravity, Urine >1.030 (*)    Hgb urine dipstick MODERATE (*)    Bilirubin Urine SMALL (*)    Protein, ur 100 (*)    Nitrite POSITIVE (*)    All other components within normal limits  URINE MICROSCOPIC-ADD ON - Abnormal; Notable for the following:    Squamous Epithelial / LPF FEW (*)    Bacteria, UA MANY (*)    All other components within normal limits  URINE CULTURE  LIPASE, BLOOD    Imaging Review Ct Abdomen Pelvis Wo Contrast  08/10/2014   CLINICAL DATA:  Abdominal and left inguinal region pain  EXAM: CT ABDOMEN AND PELVIS WITHOUT CONTRAST  TECHNIQUE: Multidetector CT imaging of the abdomen and pelvis was performed following the standard protocol without IV contrast. The patient did receive oral contrast.  COMPARISON:  December 04, 2008  FINDINGS: There is  atelectatic change in the lung bases, more on the right than on the left. There is no frank airspace consolidation in the bases. There is a sizable paraesophageal hernia with much of the stomach above the diaphragm. There are foci of coronary artery calcification as well as calcification in the mitral annulus.  There is a small calcified granuloma in the right lobe of the liver. No other focal liver lesions are identified on this noncontrast enhanced study. The gallbladder is absent. There is no appreciable biliary duct dilatation.  Spleen is normal in size and contour. There is calcification in the splenic artery. Pancreas is somewhat atrophic without mass or inflammation. Adrenals appear within normal limits  bilaterally.  There is a 6.5 x 6.5 cm cyst arising from the upper pole of the left kidney. A second cyst arising from the upper pole of the left kidney measures 1.4 x 1.3 cm. There is no hydronephrosis on either side. There is no renal or ureteral calculus on either side.  In the pelvis, the urinary bladder is midline with normal wall thickness. There are scattered sigmoid diverticula without diverticulitis. There is no pelvic mass or pelvic fluid collection. There is no periappendiceal region inflammation. No inguinal/periinguinal lesions are appreciated on either side.  There is extensive atherosclerotic change in the aorta. The aorta is tortuous. There is slight dilatation in the mid aortic region with a maximum transverse diameter of 3.1 x 2.8 cm. There is no periaortic fluid.  There is no bowel obstruction. No free air or portal venous air. There is no appreciable ascites, adenopathy, or abscess in the abdomen or pelvis. There is degenerative change in the lumbar spine. There are no blastic or lytic bone lesions. There is lumbar levoscoliosis.  IMPRESSION: There is a sizable paraesophageal type hernia, stable.  Large left renal cysts, present previously.  No bowel obstruction. No abscess. No  periappendiceal region inflammation. No renal or ureteral calculus. No hydronephrosis. There are scattered sigmoid diverticula without diverticulitis. There is extensive atherosclerotic change. There is mild dilatation in the mid abdominal aorta, a finding that was also present on prior study.   Electronically Signed   By: Bretta Bang III M.D.   On: 08/10/2014 17:09     EKG Interpretation None      MDM   Final diagnoses:  UTI (lower urinary tract infection)  Lower abdominal pain    89yF with LLQ/L flank pain. Pain interestingly seems reproducible with ROM of L hip as well with palpation of abdomen. She denies trauma. UA consistent with UTI. Labs otherwise fairly unremarkable. CT w/o acute abnormality intra abdominally or of L hip.  Initial plan for discharge but daughter concerned about pt's continued pain and apparently lives alone. Will discuss with medicine for admission. Rocephin ordered. Urine culture.     Raeford Razor, MD 08/18/14 [phone]

## 2014-08-11 DIAGNOSIS — N390 Urinary tract infection, site not specified: Secondary | ICD-10-CM | POA: Diagnosis not present

## 2014-08-11 LAB — COMPREHENSIVE METABOLIC PANEL
ALBUMIN: 3.1 g/dL — AB (ref 3.5–5.0)
ALK PHOS: 91 U/L (ref 38–126)
ALT: 16 U/L (ref 14–54)
AST: 26 U/L (ref 15–41)
Anion gap: 8 (ref 5–15)
BUN: 19 mg/dL (ref 6–20)
CALCIUM: 8.5 mg/dL — AB (ref 8.9–10.3)
CO2: 29 mmol/L (ref 22–32)
Chloride: 101 mmol/L (ref 101–111)
Creatinine, Ser: 1.03 mg/dL — ABNORMAL HIGH (ref 0.44–1.00)
GFR calc non Af Amer: 45 mL/min — ABNORMAL LOW (ref >60)
GFR, EST AFRICAN AMERICAN: 52 mL/min — AB (ref >60)
GLUCOSE: 102 mg/dL — AB (ref 70–99)
POTASSIUM: 3.6 mmol/L (ref 3.5–5.1)
SODIUM: 138 mmol/L (ref 135–145)
TOTAL PROTEIN: 6.9 g/dL (ref 6.5–8.1)
Total Bilirubin: 0.8 mg/dL (ref 0.3–1.2)

## 2014-08-11 LAB — CBC
HEMATOCRIT: 37.8 % (ref 36.0–46.0)
Hemoglobin: 11.7 g/dL — ABNORMAL LOW (ref 12.0–15.0)
MCH: 27 pg (ref 26.0–34.0)
MCHC: 31 g/dL (ref 30.0–36.0)
MCV: 87.1 fL (ref 78.0–100.0)
Platelets: 278 10*3/uL (ref 150–400)
RBC: 4.34 MIL/uL (ref 3.87–5.11)
RDW: 14.5 % (ref 11.5–15.5)
WBC: 7.1 10*3/uL (ref 4.0–10.5)

## 2014-08-11 NOTE — Care Management Note (Signed)
Case Management Note  Patient Details  Name: Manson AllanGeraldine F Texeira MRN: [id number] Date of Birth: 10/21/1917  Subjective/Objective:                  Pt admitted from home with UTI. Pt lives alone and will return home at discharge. Pt stated that she is very independent with ADL's. Pts daughter lives next door and assist pt as needed. Pt has a life alert button for home use.  Action/Plan: Will continue to follow for discharge planning needs.  Expected Discharge Date:  08/13/14               Expected Discharge Plan:  Home/Self Care  In-House Referral:  NA  Discharge planning Services  CM Consult  Post Acute Care Choice:    Choice offered to:     DME Arranged:    DME Agency:     HH Arranged:    HH Agency:     Status of Service:  In process, will continue to follow  Medicare Important Message Given:    Date Medicare IM Given:    Medicare IM give by:    Date Additional Medicare IM Given:    Additional Medicare Important Message give by:     If discussed at Long Length of Stay Meetings, dates discussed:    Additional Comments:  Cheryl FlashBlackwell, Lyliana Dicenso Crowder, RN 08/11/2014, 1:28 PM

## 2014-08-11 NOTE — Progress Notes (Signed)
UR chart review completed.  

## 2014-08-11 NOTE — Progress Notes (Signed)
Subjective: [name] was admitted by the hospitalist yesterday after presenting with abdominal pain. She was found to have a UTI. She states that she feels better. Her family reports that she vomited 3 or 4 times yesterday. She is not having difficulty with nausea and vomiting this morning. She is afebrile and hemodynamically stable. She reportedly had altered mental status yesterday and does not remember any events from the day.  Objective: Vital signs in last 24 hours: Filed Vitals:   08/10/14 1900 08/10/14 1930 08/10/14 2100 08/11/14 0551  BP: 129/63 129/65 141/82 143/70  Pulse: 79 77 67 76  Temp:   97.5 F (36.4 C) 97.4 F (36.3 C)  TempSrc:   Oral Axillary  Resp:   16 16  Height:    (1.753 m)   Weight:   167 lb 1.7 oz (75.8 kg)   SpO2: 85% 90% 94% 97%   Weight change:   Intake/Output Summary (Last 24 hours) at 08/11/14 0739 Last data filed at 08/11/14 0600  Gross per 24 hour  Intake 710.83 ml  Output    251 ml  Net 459.83 ml    Physical Exam: Alert. No distress. Neuro at baseline. Lungs clear. Heart irregular. Abdomen soft and mildly tender in the lower abdomen. Range of motion with the left hip produces left groin pain. X-rays of her pelvis revealed no fracture.  CT scan of the abdomen reveals no inflammatory process.  Lab Results:    Results for orders placed or performed during the hospital encounter of 08/10/14 (from the past 24 hour(s))  CBC with Differential/Platelet     Status: Abnormal   Collection Time: 08/10/14 12:16 PM  Result Value Ref Range   WBC 8.9 4.0 - 10.5 K/uL   RBC 4.35 3.87 - 5.11 MIL/uL   Hemoglobin 11.7 (L) 12.0 - 15.0 g/dL   HCT 04.5 40.9 - 81.1 %   MCV 86.0 78.0 - 100.0 fL   MCH 26.9 26.0 - 34.0 pg   MCHC 31.3 30.0 - 36.0 g/dL   RDW 91.4 78.2 - 95.6 %   Platelets 255 150 - 400 K/uL   Neutrophils Relative % 77 43 - 77 %   Neutro Abs 6.8 1.7 - 7.7 K/uL   Lymphocytes Relative 9 (L) 12 - 46 %   Lymphs Abs 0.8 0.7 - 4.0 K/uL    Monocytes Relative 14 (H) 3 - 12 %   Monocytes Absolute 1.2 (H) 0.1 - 1.0 K/uL   Eosinophils Relative 0 0 - 5 %   Eosinophils Absolute 0.0 0.0 - 0.7 K/uL   Basophils Relative 0 0 - 1 %   Basophils Absolute 0.0 0.0 - 0.1 K/uL  Comprehensive metabolic panel     Status: Abnormal   Collection Time: 08/10/14 12:16 PM  Result Value Ref Range   Sodium 140 135 - 145 mmol/L   Potassium 3.6 3.5 - 5.1 mmol/L   Chloride 103 101 - 111 mmol/L   CO2 28 22 - 32 mmol/L   Glucose, Bld 140 (H) 70 - 99 mg/dL   BUN 23 (H) 6 - 20 mg/dL   Creatinine, Ser 2.13 (H) 0.44 - 1.00 mg/dL   Calcium 8.5 (L) 8.9 - 10.3 mg/dL   Total Protein 7.2 6.5 - 8.1 g/dL   Albumin 3.3 (L) 3.5 - 5.0 g/dL   AST 27 15 - 41 U/L   ALT 16 14 - 54 U/L   Alkaline Phosphatase 99 38 - 126 U/L   Total Bilirubin 1.2 0.3 -  1.2 mg/dL   GFR calc non Af Amer 33 (L) >60 mL/min   GFR calc Af Amer 38 (L) >60 mL/min   Anion gap 9 5 - 15  Lipase, blood     Status: None   Collection Time: 08/10/14 12:16 PM  Result Value Ref Range   Lipase 23 22 - 51 U/L  Urinalysis, Routine w reflex microscopic     Status: Abnormal   Collection Time: 08/10/14  2:41 PM  Result Value Ref Range   Color, Urine YELLOW YELLOW   APPearance CLEAR CLEAR   Specific Gravity, Urine >1.030 (H) 1.005 - 1.030   pH 5.5 5.0 - 8.0   Glucose, UA NEGATIVE NEGATIVE mg/dL   Hgb urine dipstick MODERATE (A) NEGATIVE   Bilirubin Urine SMALL (A) NEGATIVE   Ketones, ur NEGATIVE NEGATIVE mg/dL   Protein, ur 161 (A) NEGATIVE mg/dL   Urobilinogen, UA 1.0 0.0 - 1.0 mg/dL   Nitrite POSITIVE (A) NEGATIVE   Leukocytes, UA NEGATIVE NEGATIVE  Urine microscopic-add on     Status: Abnormal   Collection Time: 08/10/14  2:41 PM  Result Value Ref Range   Squamous Epithelial / LPF FEW (A) RARE   WBC, UA 7-10 <3 WBC/hpf   RBC / HPF 3-6 <3 RBC/hpf   Bacteria, UA MANY (A) RARE  Comprehensive metabolic panel     Status: Abnormal   Collection Time: 08/11/14  6:05 AM  Result Value Ref Range    Sodium 138 135 - 145 mmol/L   Potassium 3.6 3.5 - 5.1 mmol/L   Chloride 101 101 - 111 mmol/L   CO2 29 22 - 32 mmol/L   Glucose, Bld 102 (H) 70 - 99 mg/dL   BUN 19 6 - 20 mg/dL   Creatinine, Ser 0.96 (H) 0.44 - 1.00 mg/dL   Calcium 8.5 (L) 8.9 - 10.3 mg/dL   Total Protein 6.9 6.5 - 8.1 g/dL   Albumin 3.1 (L) 3.5 - 5.0 g/dL   AST 26 15 - 41 U/L   ALT 16 14 - 54 U/L   Alkaline Phosphatase 91 38 - 126 U/L   Total Bilirubin 0.8 0.3 - 1.2 mg/dL   GFR calc non Af Amer 45 (L) >60 mL/min   GFR calc Af Amer 52 (L) >60 mL/min   Anion gap 8 5 - 15  CBC     Status: Abnormal   Collection Time: 08/11/14  6:05 AM  Result Value Ref Range   WBC 7.1 4.0 - 10.5 K/uL   RBC 4.34 3.87 - 5.11 MIL/uL   Hemoglobin 11.7 (L) 12.0 - 15.0 g/dL   HCT 04.5 40.9 - 81.1 %   MCV 87.1 78.0 - 100.0 fL   MCH 27.0 26.0 - 34.0 pg   MCHC 31.0 30.0 - 36.0 g/dL   RDW 91.4 78.2 - 95.6 %   Platelets 278 150 - 400 K/uL     ABGS No results for input(s): PHART, PO2ART, TCO2, HCO3 in the last 72 hours.  Invalid input(s): PCO2 CULTURES No results found for this or any previous visit (from the past 240 hour(s)). Studies/Results: Ct Abdomen Pelvis Wo Contrast  08/10/2014   CLINICAL DATA:  Abdominal and left inguinal region pain  EXAM: CT ABDOMEN AND PELVIS WITHOUT CONTRAST  TECHNIQUE: Multidetector CT imaging of the abdomen and pelvis was performed following the standard protocol without IV contrast. The patient did receive oral contrast.  COMPARISON:  December 04, 2008  FINDINGS: There is atelectatic change in the lung bases, more on  the right than on the left. There is no frank airspace consolidation in the bases. There is a sizable paraesophageal hernia with much of the stomach above the diaphragm. There are foci of coronary artery calcification as well as calcification in the mitral annulus.  There is a small calcified granuloma in the right lobe of the liver. No other focal liver lesions are identified on this noncontrast  enhanced study. The gallbladder is absent. There is no appreciable biliary duct dilatation.  Spleen is normal in size and contour. There is calcification in the splenic artery. Pancreas is somewhat atrophic without mass or inflammation. Adrenals appear within normal limits bilaterally.  There is a 6.5 x 6.5 cm cyst arising from the upper pole of the left kidney. A second cyst arising from the upper pole of the left kidney measures 1.4 x 1.3 cm. There is no hydronephrosis on either side. There is no renal or ureteral calculus on either side.  In the pelvis, the urinary bladder is midline with normal wall thickness. There are scattered sigmoid diverticula without diverticulitis. There is no pelvic mass or pelvic fluid collection. There is no periappendiceal region inflammation. No inguinal/periinguinal lesions are appreciated on either side.  There is extensive atherosclerotic change in the aorta. The aorta is tortuous. There is slight dilatation in the mid aortic region with a maximum transverse diameter of 3.1 x 2.8 cm. There is no periaortic fluid.  There is no bowel obstruction. No free air or portal venous air. There is no appreciable ascites, adenopathy, or abscess in the abdomen or pelvis. There is degenerative change in the lumbar spine. There are no blastic or lytic bone lesions. There is lumbar levoscoliosis.  IMPRESSION: There is a sizable paraesophageal type hernia, stable.  Large left renal cysts, present previously.  No bowel obstruction. No abscess. No periappendiceal region inflammation. No renal or ureteral calculus. No hydronephrosis. There are scattered sigmoid diverticula without diverticulitis. There is extensive atherosclerotic change. There is mild dilatation in the mid abdominal aorta, a finding that was also present on prior study.   Electronically Signed   By: Bretta BangWilliam  Woodruff III M.D.   On: 08/10/2014 17:09   Dg Pelvis 1-2 Views  08/10/2014   CLINICAL DATA:  Lower pelvic pain, left groin  pain  EXAM: PELVIS - 1-2 VIEW  COMPARISON:  None.  FINDINGS: There is no evidence of pelvic fracture or diastasis. No pelvic bone lesions are seen. There is generalized osteopenia. There is peripheral vascular atherosclerotic disease.  IMPRESSION: No acute osseous injury of the pelvis.   Electronically Signed   By: Elige KoHetal  Patel   On: 08/10/2014 20:06   Micro Results: No results found for this or any previous visit (from the past 240 hour(s)). Studies/Results: Ct Abdomen Pelvis Wo Contrast  08/10/2014   CLINICAL DATA:  Abdominal and left inguinal region pain  EXAM: CT ABDOMEN AND PELVIS WITHOUT CONTRAST  TECHNIQUE: Multidetector CT imaging of the abdomen and pelvis was performed following the standard protocol without IV contrast. The patient did receive oral contrast.  COMPARISON:  December 04, 2008  FINDINGS: There is atelectatic change in the lung bases, more on the right than on the left. There is no frank airspace consolidation in the bases. There is a sizable paraesophageal hernia with much of the stomach above the diaphragm. There are foci of coronary artery calcification as well as calcification in the mitral annulus.  There is a small calcified granuloma in the right lobe of the liver. No other focal  liver lesions are identified on this noncontrast enhanced study. The gallbladder is absent. There is no appreciable biliary duct dilatation.  Spleen is normal in size and contour. There is calcification in the splenic artery. Pancreas is somewhat atrophic without mass or inflammation. Adrenals appear within normal limits bilaterally.  There is a 6.5 x 6.5 cm cyst arising from the upper pole of the left kidney. A second cyst arising from the upper pole of the left kidney measures 1.4 x 1.3 cm. There is no hydronephrosis on either side. There is no renal or ureteral calculus on either side.  In the pelvis, the urinary bladder is midline with normal wall thickness. There are scattered sigmoid diverticula without  diverticulitis. There is no pelvic mass or pelvic fluid collection. There is no periappendiceal region inflammation. No inguinal/periinguinal lesions are appreciated on either side.  There is extensive atherosclerotic change in the aorta. The aorta is tortuous. There is slight dilatation in the mid aortic region with a maximum transverse diameter of 3.1 x 2.8 cm. There is no periaortic fluid.  There is no bowel obstruction. No free air or portal venous air. There is no appreciable ascites, adenopathy, or abscess in the abdomen or pelvis. There is degenerative change in the lumbar spine. There are no blastic or lytic bone lesions. There is lumbar levoscoliosis.  IMPRESSION: There is a sizable paraesophageal type hernia, stable.  Large left renal cysts, present previously.  No bowel obstruction. No abscess. No periappendiceal region inflammation. No renal or ureteral calculus. No hydronephrosis. There are scattered sigmoid diverticula without diverticulitis. There is extensive atherosclerotic change. There is mild dilatation in the mid abdominal aorta, a finding that was also present on prior study.   Electronically Signed   By: Bretta BangWilliam  Woodruff III M.D.   On: 08/10/2014 17:09   Dg Pelvis 1-2 Views  08/10/2014   CLINICAL DATA:  Lower pelvic pain, left groin pain  EXAM: PELVIS - 1-2 VIEW  COMPARISON:  None.  FINDINGS: There is no evidence of pelvic fracture or diastasis. No pelvic bone lesions are seen. There is generalized osteopenia. There is peripheral vascular atherosclerotic disease.  IMPRESSION: No acute osseous injury of the pelvis.   Electronically Signed   By: Elige KoHetal  Patel   On: 08/10/2014 20:06   Medications:  I have reviewed the patient's current medications Scheduled Meds: . cefTRIAXone (ROCEPHIN)  IV  1 g Intravenous Q24H  . diltiazem  240 mg Oral Daily  . furosemide  20 mg Oral Daily  . heparin  5,000 Units Subcutaneous 3 times per day  . losartan  100 mg Oral Daily  . metoprolol  50 mg Oral  BID  . pantoprazole  40 mg Oral Daily  . simvastatin  10 mg Oral QHS   Continuous Infusions: . sodium chloride 50 mL/hr at 08/10/14 2009   PRN Meds:.ondansetron **OR** ondansetron (ZOFRAN) IV   Assessment/Plan: #1. UTI. Culture pending. Continue ceftriaxone. #2. Left pelvic pain. No sign of fracture. #3. Altered mental status. Resolved. Active Problems:   Atrial fibrillation   Hypertension   UTI (urinary tract infection)   Left hip pain   UTI (lower urinary tract infection)     LOS: 1 day   Aydyn Testerman 08/11/2014, 7:39 AM

## 2014-08-12 ENCOUNTER — Inpatient Hospital Stay (HOSPITAL_COMMUNITY): Payer: Medicare Other

## 2014-08-12 DIAGNOSIS — M25562 Pain in left knee: Secondary | ICD-10-CM | POA: Diagnosis not present

## 2014-08-12 DIAGNOSIS — M25462 Effusion, left knee: Secondary | ICD-10-CM | POA: Diagnosis not present

## 2014-08-12 DIAGNOSIS — M25572 Pain in left ankle and joints of left foot: Secondary | ICD-10-CM | POA: Diagnosis not present

## 2014-08-12 DIAGNOSIS — M7752 Other enthesopathy of left foot: Secondary | ICD-10-CM | POA: Diagnosis not present

## 2014-08-12 DIAGNOSIS — M25552 Pain in left hip: Secondary | ICD-10-CM | POA: Diagnosis not present

## 2014-08-12 DIAGNOSIS — N390 Urinary tract infection, site not specified: Secondary | ICD-10-CM | POA: Diagnosis not present

## 2014-08-12 DIAGNOSIS — M11262 Other chondrocalcinosis, left knee: Secondary | ICD-10-CM | POA: Diagnosis not present

## 2014-08-12 DIAGNOSIS — M1712 Unilateral primary osteoarthritis, left knee: Secondary | ICD-10-CM | POA: Diagnosis not present

## 2014-08-12 MED ORDER — LIDOCAINE HCL (PF) 1 % IJ SOLN: 5.0000 mL | Freq: Once | INTRAMUSCULAR | Status: DC

## 2014-08-12 MED ORDER — ACETAMINOPHEN 325 MG PO TABS
650.0000 mg | ORAL_TABLET | ORAL | Status: DC | PRN
  Administered 2014-08-12: 650 mg via ORAL

## 2014-08-12 MED ORDER — LIDOCAINE HCL (PF) 1 % IJ SOLN
INTRAMUSCULAR | Status: AC
  Administered 2014-08-12: 5 mL
  Filled 2014-08-12: qty 5

## 2014-08-12 MED ORDER — METHYLPREDNISOLONE ACETATE 40 MG/ML IJ SUSP
40.0000 mg | Freq: Once | INTRAMUSCULAR | Status: AC
  Administered 2014-08-12: 40 mg via INTRA_ARTICULAR
  Filled 2014-08-12: qty 1

## 2014-08-12 NOTE — Progress Notes (Signed)
Patient refused to get out of bed at this time. Patient informed that she will need to start getting out of bed or she will get weak laying in bed. Son encouraged patient to get out of bed as well. Patient continued to decline at this time.

## 2014-08-12 NOTE — Care Management Note (Signed)
Case Management Note  Patient Details  Name: Manson AllanGeraldine F Bunkley MRN: [id number] Date of Birth: 05/15/1917  Subjective/Objective:                    Action/Plan:   Expected Discharge Date:  08/13/14               Expected Discharge Plan:  Home/Self Care  In-House Referral:  NA  Discharge planning Services  CM Consult  Post Acute Care Choice:    Choice offered to:     DME Arranged:    DME Agency:     HH Arranged:    HH Agency:     Status of Service:  In process, will continue to follow  Medicare Important Message Given:    Date Medicare IM Given:    Medicare IM give by:    Date Additional Medicare IM Given:    Additional Medicare Important Message give by:     If discussed at Long Length of Stay Meetings, dates discussed:    Additional Comments: Pt changed to OBS. CC 44 given. Arlyss QueenBlackwell, Samyah Bilbo Congerrowder, RN 08/12/2014, 11:41 AM

## 2014-08-12 NOTE — Progress Notes (Signed)
Patient up to chair at this time. Patient weak and one person assist up at this this time.

## 2014-08-12 NOTE — Progress Notes (Signed)
Subjective: Mrs. Logan Boresvans now complains of pain in her left knee under left ankle. She has had pain in her left groin. Pelvic x-rays were negative.  Objective: Vital signs in last 24 hours: Filed Vitals:   08/11/14 0551 08/11/14 1427 08/11/14 2020 08/12/14 0431  BP: 143/70 138/68 130/49 140/89  Pulse: 76 78 86 70  Temp: 97.4 F (36.3 C) 98.4 F (36.9 C) 99.9 F (37.7 C) 98.9 F (37.2 C)  TempSrc: Axillary Oral Oral Oral  Resp: 16 16 16 16   Height:      Weight:      SpO2: 97% 96% 96% 95%   Weight change:   Intake/Output Summary (Last 24 hours) at 08/12/14 0748 Last data filed at 08/11/14 1859  Gross per 24 hour  Intake 784.83 ml  Output      0 ml  Net 784.83 ml    Physical Exam: No distress. Alert. Lungs clear. Heart irregular. Abdomen soft and nontender. Left knee is tender with movement. Left ankle is tender with movement. Range of motion attempts with the left hip induced pain.  Lab Results:   No results found for this or any previous visit (from the past 24 hour(s)).   ABGS No results for input(s): PHART, PO2ART, TCO2, HCO3 in the last 72 hours.  Invalid input(s): PCO2 CULTURES Recent Results (from the past 240 hour(s))  Urine culture     Status: None (Preliminary result)   Collection Time: 08/10/14  2:45 PM  Result Value Ref Range Status   Specimen Description URINE, CATHETERIZED  Final   Special Requests NONE  Final   Colony Count   Final    >=100,000 COLONIES/ML Performed at [facility]    Culture   Final    GRAM NEGATIVE RODS Performed at [facility]    Report Status PENDING  Incomplete   Studies/Results: Ct Abdomen Pelvis Wo Contrast  08/10/2014   CLINICAL DATA:  Abdominal and left inguinal region pain  EXAM: CT ABDOMEN AND PELVIS WITHOUT CONTRAST  TECHNIQUE: Multidetector CT imaging of the abdomen and pelvis was performed following the standard protocol without IV contrast. The patient did receive oral contrast.  COMPARISON:  December 04, 2008  FINDINGS: There is atelectatic change in the lung bases, more on the right than on the left. There is no frank airspace consolidation in the bases. There is a sizable paraesophageal hernia with much of the stomach above the diaphragm. There are foci of coronary artery calcification as well as calcification in the mitral annulus.  There is a small calcified granuloma in the right lobe of the liver. No other focal liver lesions are identified on this noncontrast enhanced study. The gallbladder is absent. There is no appreciable biliary duct dilatation.  Spleen is normal in size and contour. There is calcification in the splenic artery. Pancreas is somewhat atrophic without mass or inflammation. Adrenals appear within normal limits bilaterally.  There is a 6.5 x 6.5 cm cyst arising from the upper pole of the left kidney. A second cyst arising from the upper pole of the left kidney measures 1.4 x 1.3 cm. There is no hydronephrosis on either side. There is no renal or ureteral calculus on either side.  In the pelvis, the urinary bladder is midline with normal wall thickness. There are scattered sigmoid diverticula without diverticulitis. There is no pelvic mass or pelvic fluid collection. There is no periappendiceal region inflammation. No inguinal/periinguinal lesions are appreciated on either side.  There is extensive atherosclerotic change  in the aorta. The aorta is tortuous. There is slight dilatation in the mid aortic region with a maximum transverse diameter of 3.1 x 2.8 cm. There is no periaortic fluid.  There is no bowel obstruction. No free air or portal venous air. There is no appreciable ascites, adenopathy, or abscess in the abdomen or pelvis. There is degenerative change in the lumbar spine. There are no blastic or lytic bone lesions. There is lumbar levoscoliosis.  IMPRESSION: There is a sizable paraesophageal type hernia, stable.  Large left renal cysts, present previously.  No bowel obstruction.  No abscess. No periappendiceal region inflammation. No renal or ureteral calculus. No hydronephrosis. There are scattered sigmoid diverticula without diverticulitis. There is extensive atherosclerotic change. There is mild dilatation in the mid abdominal aorta, a finding that was also present on prior study.   Electronically Signed   By: Bretta Bang III M.D.   On: 08/10/2014 17:09   Dg Pelvis 1-2 Views  08/10/2014   CLINICAL DATA:  Lower pelvic pain, left groin pain  EXAM: PELVIS - 1-2 VIEW  COMPARISON:  None.  FINDINGS: There is no evidence of pelvic fracture or diastasis. No pelvic bone lesions are seen. There is generalized osteopenia. There is peripheral vascular atherosclerotic disease.  IMPRESSION: No acute osseous injury of the pelvis.   Electronically Signed   By: Elige Ko   On: 08/10/2014 20:06   Micro Results: Recent Results (from the past 240 hour(s))  Urine culture     Status: None (Preliminary result)   Collection Time: 08/10/14  2:45 PM  Result Value Ref Range Status   Specimen Description URINE, CATHETERIZED  Final   Special Requests NONE  Final   Colony Count   Final    >=100,000 COLONIES/ML Performed at [facility]    Culture   Final    GRAM NEGATIVE RODS Performed at [facility]    Report Status PENDING  Incomplete   Studies/Results: Ct Abdomen Pelvis Wo Contrast  08/10/2014   CLINICAL DATA:  Abdominal and left inguinal region pain  EXAM: CT ABDOMEN AND PELVIS WITHOUT CONTRAST  TECHNIQUE: Multidetector CT imaging of the abdomen and pelvis was performed following the standard protocol without IV contrast. The patient did receive oral contrast.  COMPARISON:  December 04, 2008  FINDINGS: There is atelectatic change in the lung bases, more on the right than on the left. There is no frank airspace consolidation in the bases. There is a sizable paraesophageal hernia with much of the stomach above the diaphragm. There are foci of coronary artery  calcification as well as calcification in the mitral annulus.  There is a small calcified granuloma in the right lobe of the liver. No other focal liver lesions are identified on this noncontrast enhanced study. The gallbladder is absent. There is no appreciable biliary duct dilatation.  Spleen is normal in size and contour. There is calcification in the splenic artery. Pancreas is somewhat atrophic without mass or inflammation. Adrenals appear within normal limits bilaterally.  There is a 6.5 x 6.5 cm cyst arising from the upper pole of the left kidney. A second cyst arising from the upper pole of the left kidney measures 1.4 x 1.3 cm. There is no hydronephrosis on either side. There is no renal or ureteral calculus on either side.  In the pelvis, the urinary bladder is midline with normal wall thickness. There are scattered sigmoid diverticula without diverticulitis. There is no pelvic mass or pelvic fluid collection. There is no  periappendiceal region inflammation. No inguinal/periinguinal lesions are appreciated on either side.  There is extensive atherosclerotic change in the aorta. The aorta is tortuous. There is slight dilatation in the mid aortic region with a maximum transverse diameter of 3.1 x 2.8 cm. There is no periaortic fluid.  There is no bowel obstruction. No free air or portal venous air. There is no appreciable ascites, adenopathy, or abscess in the abdomen or pelvis. There is degenerative change in the lumbar spine. There are no blastic or lytic bone lesions. There is lumbar levoscoliosis.  IMPRESSION: There is a sizable paraesophageal type hernia, stable.  Large left renal cysts, present previously.  No bowel obstruction. No abscess. No periappendiceal region inflammation. No renal or ureteral calculus. No hydronephrosis. There are scattered sigmoid diverticula without diverticulitis. There is extensive atherosclerotic change. There is mild dilatation in the mid abdominal aorta, a finding that  was also present on prior study.   Electronically Signed   By: Bretta BangWilliam  Woodruff III M.D.   On: 08/10/2014 17:09   Dg Pelvis 1-2 Views  08/10/2014   CLINICAL DATA:  Lower pelvic pain, left groin pain  EXAM: PELVIS - 1-2 VIEW  COMPARISON:  None.  FINDINGS: There is no evidence of pelvic fracture or diastasis. No pelvic bone lesions are seen. There is generalized osteopenia. There is peripheral vascular atherosclerotic disease.  IMPRESSION: No acute osseous injury of the pelvis.   Electronically Signed   By: Elige KoHetal  Patel   On: 08/10/2014 20:06   Medications:  I have reviewed the patient's current medications Scheduled Meds: . cefTRIAXone (ROCEPHIN)  IV  1 g Intravenous Q24H  . diltiazem  240 mg Oral Daily  . furosemide  20 mg Oral Daily  . heparin  5,000 Units Subcutaneous 3 times per day  . losartan  100 mg Oral Daily  . metoprolol  50 mg Oral BID  . pantoprazole  40 mg Oral Daily  . simvastatin  10 mg Oral QHS   Continuous Infusions: . sodium chloride 10 mL/hr at 08/11/14 0835   PRN Meds:.ondansetron **OR** ondansetron (ZOFRAN) IV   Assessment/Plan: #1. UTI. Continue ceftriaxone. #2. Left hip, knee and ankle pain. X-ray left knee and ankle. Consult orthopedics. #3. Chronic atrial fibrillation. Stable. Continue diltiazem. Active Problems:   Atrial fibrillation   Hypertension   UTI (urinary tract infection)   Left hip pain   UTI (lower urinary tract infection)     LOS: 2 days   Yarisbel Miranda 08/12/2014, 7:48 AM

## 2014-08-12 NOTE — Consult Note (Signed)
Reason for Consult:Pain left hip, left knee, left ankle Referring Physician: Devinn Voshell is an 81 y.o. female.  HPI: Patient has several day history of pain in the left hip, left knee and left ankle. She denies falls.  She has had surgery on the right knee (total) in the past.  The left knee has recurrent swelling and pain and popping but no giving way.  She had CT scan of abdomen showing no fracture of hip or pelvis.  She has large swelling of the left knee with no bruising.  She has some mild pain of the left ankle.  She declines to get out of bed.  I saw her this morning but ordered x-rays of the knee and the ankle.  Ankle x-rays are negative but the knee has significant DJD and large effusion with no fracture.  She complains of pain almost anywhere she is touched.  She has no signs of trauma.  Her son is present.  Past Medical History  Diagnosis Date  . Hypertension     Nl renal arteriography in 2001  . Stroke 2000 and    2010: Left MCA; negative carotid ultrasound; supraclinoid aneurysm bilaterally; presumed embolic from AF  . Congestive heart failure     06/2012 Echo: Moderate to severe LVH, nl EF, moderate LAE; mild to moderate pulmonary hypertension  . Gastroesophageal reflux disease   . Hyperlipidemia     Lipid profile 2010:112, 106, 58, 33  . PONV (postoperative nausea and vomiting)   . Syncope 2003, 2014    Attributed to UTI in 2003, acute gastroenteritis in 2014  . Ischemic colitis 2006  . Arteriosclerotic cardiovascular disease (ASCVD)     H/o MI; cath in 2001:30-40% LAD, nl Cx, TO RCA with collaterals, EF-70% with inferior hypokinesis; repeat cath in 2002-unchanged; 2006: Neg stress nuclear  . Diverticulitis     Extensive diverticulosis in 2006  . Atrial fibrillation     No anticoagulation  . Nodule of right lung     Past Surgical History  Procedure Laterality Date  . Knee surgery    . Colonoscopy  2006    Dr. Laural Golden: ischemic colitis involving descending  colon, extensive diverticula  . Esophagogastroduodenoscopy (egd) with esophageal dilation N/A 06/24/2012    Procedure: ESOPHAGOGASTRODUODENOSCOPY (EGD) WITH ESOPHAGEAL DILATION;  Surgeon: Daneil Dolin, MD;  Location: [facility];  Service: Endoscopy;  Laterality: N/A;    Family History  Problem Relation Age of Onset  . Cancer Mother   . Cancer Sister   . Heart attack Father   . Colon cancer Neg Hx     Social History:  reports that she has never smoked. She does not have any smokeless tobacco history on file. She reports that she does not drink alcohol or use illicit drugs.  Allergies:  Allergies  Allergen Reactions  . Ciprofloxacin Rash    Medications: I have reviewed the patient's current medications.  Results for orders placed or performed during the hospital encounter of 08/10/14 (from the past 48 hour(s))  Comprehensive metabolic panel     Status: Abnormal   Collection Time: 08/11/14  6:05 AM  Result Value Ref Range   Sodium 138 135 - 145 mmol/L   Potassium 3.6 3.5 - 5.1 mmol/L   Chloride 101 101 - 111 mmol/L   CO2 29 22 - 32 mmol/L   Glucose, Bld 102 (H) 70 - 99 mg/dL   BUN 19 6 - 20 mg/dL   Creatinine, Ser 1.03 (H) 0.44 -  1.00 mg/dL   Calcium 8.5 (L) 8.9 - 10.3 mg/dL   Total Protein 6.9 6.5 - 8.1 g/dL   Albumin 3.1 (L) 3.5 - 5.0 g/dL   AST 26 15 - 41 U/L   ALT 16 14 - 54 U/L   Alkaline Phosphatase 91 38 - 126 U/L   Total Bilirubin 0.8 0.3 - 1.2 mg/dL   GFR calc non Af Amer 45 (L) >60 mL/min   GFR calc Af Amer 52 (L) >60 mL/min    Comment: (NOTE) The eGFR has been calculated using the CKD EPI equation. This calculation has not been validated in all clinical situations. eGFR's persistently <90 mL/min signify possible Chronic Kidney Disease.    Anion gap 8 5 - 15  CBC     Status: Abnormal   Collection Time: 08/11/14  6:05 AM  Result Value Ref Range   WBC 7.1 4.0 - 10.5 K/uL   RBC 4.34 3.87 - 5.11 MIL/uL   Hemoglobin 11.7 (L) 12.0 - 15.0 g/dL   HCT 37.8  36.0 - 46.0 %   MCV 87.1 78.0 - 100.0 fL   MCH 27.0 26.0 - 34.0 pg   MCHC 31.0 30.0 - 36.0 g/dL   RDW 14.5 11.5 - 15.5 %   Platelets 278 150 - 400 K/uL    Dg Pelvis 1-2 Views  08/10/2014   CLINICAL DATA:  Lower pelvic pain, left groin pain  EXAM: PELVIS - 1-2 VIEW  COMPARISON:  None.  FINDINGS: There is no evidence of pelvic fracture or diastasis. No pelvic bone lesions are seen. There is generalized osteopenia. There is peripheral vascular atherosclerotic disease.  IMPRESSION: No acute osseous injury of the pelvis.   Electronically Signed   By: Kathreen Devoid   On: 08/10/2014 20:06   Dg Ankle Complete Left  08/12/2014   CLINICAL DATA:  Left ankle pain, no known injury  EXAM: LEFT ANKLE COMPLETE - 3+ VIEW  COMPARISON:  None.  FINDINGS: No fracture or dislocation is seen.  The ankle mortise is intact.  The base of the fifth metatarsal is unremarkable.  Small plantar and posterior calcaneal enthesophytes.  Visualized soft tissues are within normal limits.  IMPRESSION: No fracture or dislocation is seen.   Electronically Signed   By: Julian Hy M.D.   On: 08/12/2014 14:23   Dg Knee Complete 4 Views Left  08/12/2014   CLINICAL DATA:  Left knee pain with effusion, no known injury  EXAM: LEFT KNEE - COMPLETE 4+ VIEW  COMPARISON:  None.  FINDINGS: No fracture or dislocation is seen.  Moderate to severe tricompartmental degenerative changes, most prominent in the lateral and patellofemoral compartments. Medial compartment chondrocalcinosis.  Large suprapatellar knee joint effusion.  Vascular calcifications  IMPRESSION: No fracture or dislocation is seen.  Moderate to severe degenerative changes.  Large suprapatellar knee joint effusion.   Electronically Signed   By: Julian Hy M.D.   On: 08/12/2014 14:22    Review of Systems  Cardiovascular:       History of hypertension, atrial fibrillation, congestive heart disease.  Gastrointestinal:       History of diverticulitis.  Musculoskeletal:  Positive for joint pain (Pain left knee and left ankle.).   Blood pressure 125/65, pulse 98, temperature 98.7 F (37.1 C), temperature source Oral, resp. rate 16, height 5' 9"  (1.753 m), weight 75.8 kg (167 lb 1.7 oz), SpO2 97 %. Physical Exam  Constitutional: She is oriented to person, place, and time. She appears well-developed and well-nourished.  HENT:  Head: Normocephalic and atraumatic.  Eyes: Conjunctivae are normal. Pupils are equal, round, and reactive to light.  Neck: Normal range of motion.  Cardiovascular: Intact distal pulses.   Respiratory: Effort normal.  GI: Soft.  Musculoskeletal: She exhibits tenderness (Pain and large effusion of the left knee with ROM 5 to 65 degrees, stable.  Complains of pain with touching knee.  Some mild tenderness of left ankle, no swelling.  Old well healed scar right knee.).  Neurological: She is alert and oriented to person, place, and time. She has normal reflexes.  Skin: Skin is warm and dry.  Psychiatric: She has a normal mood and affect. Her behavior is normal. Judgment and thought content normal.    Assessment/Plan: Significant DJD changes of the left knee, tricompartmental, with very large effusion.  I have aspirated the knee of 120 cc of fluid after sterile prep and instilled 40 mgm DepoMedrol and 4 cc 1% Xylocaine tolerated well.  She has still some fluid left in the knee.  I have told her that the knee will hurt more with activity and with all the rain we have had.  She needs to get out of bed and use walker.  I do not feel she is an operative candidate and may well need further aspirations in the future.  Ankle pain left and hip pain left, related more to degenerative changes of age with no fracture seen.  I can see in the office after discharge for re-evaluation of the left knee.  She should use a walker.    Brae Schaafsma 08/12/2014, 5:33 PM

## 2014-08-13 DIAGNOSIS — N390 Urinary tract infection, site not specified: Secondary | ICD-10-CM | POA: Diagnosis not present

## 2014-08-13 LAB — URINE CULTURE: Colony Count: >=100000

## 2014-08-13 MED ORDER — CEPHALEXIN 250 MG PO CAPS: 250.0000 mg | ORAL_CAPSULE | Freq: Three times a day (TID) | ORAL | Status: DC

## 2014-08-13 NOTE — Discharge Summary (Signed)
Physician Discharge Summary  [name] WUJ:[id number] DOB: 12/25/1917 DOA: 08/10/2014   Admit date: 08/10/2014 Discharge date: 08/13/2014  Discharge Diagnoses: #1. Klebsiella UTI. #2. Left pelvic pain. #3. Atrial fibrillation. #4. Hypertension. #5. Left knee effusion. Active Problems:   Atrial fibrillation   Hypertension   UTI (urinary tract infection)   Left hip pain   UTI (lower urinary tract infection)    Wt Readings from Last 3 Encounters:  08/10/14 167 lb 1.7 oz (75.8 kg)  07/22/12 178 lb (80.74 kg)  07/14/12 187 lb (84.823 kg)     Hospital Course:  This patient is a 81 year old female who presented with left lower abdominal pain. She was found to have a UTI and was started on ceftriaxone. Her culture revealed Klebsiella which was sensitive to cephalosporins. Her white count was normal. There was no fever. She had pain in her left groin. X-rays of her pelvis revealed no fracture. She also had pain in her left knee and ankle. X-rays revealed a left knee effusion and degenerative changes. She was seen by orthopedics and had an arthrocentesis of 120 mL. Her abdominal discomfort resolved. She remained hemodynamically stable.  On the day of discharge her abdomen is soft and nontender. She has improved range of motion in her left knee.  She will continue oral outpatient antibiotic therapy with cephalexin. She will be seen in follow-up in office in 1 week. She had a CT scan of the abdomen and pelvis as well which revealed no sign of inflammatory process.  There were extensive atherosclerotic changes. She has a large parous esophageal hernia and left renal cysts.   Discharge Instructions     Medication List    STOP taking these medications        KLOR-CON M20 20 MEQ tablet  Generic drug:  potassium chloride SA      TAKE these medications        cephALEXin 250 MG capsule  Commonly known as:  KEFLEX  Take 1 capsule (250 mg total) by mouth 3 (three) times daily.     diltiazem 240 MG 24 hr capsule  Commonly known as:  CARDIZEM CD  Take 1 capsule (240 mg total) by mouth daily.     furosemide 20 MG tablet  Commonly known as:  LASIX  Take 20 mg by mouth daily.     losartan 100 MG tablet  Commonly known as:  COZAAR  Take 100 mg by mouth daily.     metoprolol 50 MG tablet  Commonly known as:  LOPRESSOR  Take 1 tablet (50 mg total) by mouth 2 (two) times daily.     pantoprazole 40 MG tablet  Commonly known as:  PROTONIX  Take 1 tablet (40 mg total) by mouth daily.     simvastatin 10 MG tablet  Commonly known as:  ZOCOR  Take 10 mg by mouth at bedtime.         [name] 08/13/2014

## 2014-08-13 NOTE — Care Management Note (Signed)
Case Management Note  Patient Details  Name: Manson AllanGeraldine F Vandevender MRN: [id number] Date of Birth: 10/31/1917  Subjective/Objective:                    Action/Plan:   Expected Discharge Date:  08/13/14               Expected Discharge Plan:  Home/Self Care  In-House Referral:  NA  Discharge planning Services  CM Consult  Post Acute Care Choice:  Home Health Choice offered to:  Adult Children  DME Arranged:    DME Agency:     HH Arranged:  RN, PT, Nurse's Aide HH Agency:  [facility]  Status of Service:  Completed, signed off  Medicare Important Message Given:    Date Medicare IM Given:    Medicare IM give by:    Date Additional Medicare IM Given:    Additional Medicare Important Message give by:     If discussed at Long Length of Stay Meetings, dates discussed:    Additional Comments: [facility] and [facility] (per daughters choice). [facility] and will collect the pts information from the chart. HH services to start within 48 hours of discharge. No DME needs noted as pt has walker for home use. Pt and pts nurse aware of discharge arrangements. Arlyss QueenBlackwell, Nathyn Luiz [place], RN 08/13/2014, 9:37 AM

## 2014-08-13 NOTE — Progress Notes (Signed)
Patient discharged with instructions, prescription, and care notes.  Verbalized understanding via teach back.  IV was removed and the site was WNL. Patient voiced no further complaints or concerns at the time of discharge.  Appointment to be scheduled per instructions.  Patient left the floor via w/c with staff and family in stable condition.

## 2014-08-14 DIAGNOSIS — R102 Pelvic and perineal pain: Secondary | ICD-10-CM | POA: Diagnosis not present

## 2014-08-14 DIAGNOSIS — B961 Klebsiella pneumoniae [K. pneumoniae] as the cause of diseases classified elsewhere: Secondary | ICD-10-CM | POA: Diagnosis not present

## 2014-08-14 DIAGNOSIS — N390 Urinary tract infection, site not specified: Secondary | ICD-10-CM | POA: Diagnosis not present

## 2014-08-14 DIAGNOSIS — I4891 Unspecified atrial fibrillation: Secondary | ICD-10-CM | POA: Diagnosis not present

## 2014-08-16 DIAGNOSIS — I4891 Unspecified atrial fibrillation: Secondary | ICD-10-CM | POA: Diagnosis not present

## 2014-08-16 DIAGNOSIS — B961 Klebsiella pneumoniae [K. pneumoniae] as the cause of diseases classified elsewhere: Secondary | ICD-10-CM | POA: Diagnosis not present

## 2014-08-16 DIAGNOSIS — N390 Urinary tract infection, site not specified: Secondary | ICD-10-CM | POA: Diagnosis not present

## 2014-08-16 DIAGNOSIS — R102 Pelvic and perineal pain: Secondary | ICD-10-CM | POA: Diagnosis not present

## 2014-08-17 DIAGNOSIS — R102 Pelvic and perineal pain: Secondary | ICD-10-CM | POA: Diagnosis not present

## 2014-08-17 DIAGNOSIS — B961 Klebsiella pneumoniae [K. pneumoniae] as the cause of diseases classified elsewhere: Secondary | ICD-10-CM | POA: Diagnosis not present

## 2014-08-17 DIAGNOSIS — I5032 Chronic diastolic (congestive) heart failure: Secondary | ICD-10-CM | POA: Diagnosis not present

## 2014-08-17 DIAGNOSIS — N390 Urinary tract infection, site not specified: Secondary | ICD-10-CM | POA: Diagnosis not present

## 2014-08-17 DIAGNOSIS — I4891 Unspecified atrial fibrillation: Secondary | ICD-10-CM | POA: Diagnosis not present

## 2014-08-19 DIAGNOSIS — I4891 Unspecified atrial fibrillation: Secondary | ICD-10-CM | POA: Diagnosis not present

## 2014-08-19 DIAGNOSIS — R102 Pelvic and perineal pain: Secondary | ICD-10-CM | POA: Diagnosis not present

## 2014-08-19 DIAGNOSIS — B961 Klebsiella pneumoniae [K. pneumoniae] as the cause of diseases classified elsewhere: Secondary | ICD-10-CM | POA: Diagnosis not present

## 2014-08-19 DIAGNOSIS — N390 Urinary tract infection, site not specified: Secondary | ICD-10-CM | POA: Diagnosis not present

## 2014-08-20 DIAGNOSIS — N390 Urinary tract infection, site not specified: Secondary | ICD-10-CM | POA: Diagnosis not present

## 2014-08-20 DIAGNOSIS — I4891 Unspecified atrial fibrillation: Secondary | ICD-10-CM | POA: Diagnosis not present

## 2014-08-20 DIAGNOSIS — B961 Klebsiella pneumoniae [K. pneumoniae] as the cause of diseases classified elsewhere: Secondary | ICD-10-CM | POA: Diagnosis not present

## 2014-08-20 DIAGNOSIS — R102 Pelvic and perineal pain: Secondary | ICD-10-CM | POA: Diagnosis not present

## 2014-08-23 DIAGNOSIS — R102 Pelvic and perineal pain: Secondary | ICD-10-CM | POA: Diagnosis not present

## 2014-08-23 DIAGNOSIS — B961 Klebsiella pneumoniae [K. pneumoniae] as the cause of diseases classified elsewhere: Secondary | ICD-10-CM | POA: Diagnosis not present

## 2014-08-23 DIAGNOSIS — N390 Urinary tract infection, site not specified: Secondary | ICD-10-CM | POA: Diagnosis not present

## 2014-08-23 DIAGNOSIS — I4891 Unspecified atrial fibrillation: Secondary | ICD-10-CM | POA: Diagnosis not present

## 2014-08-25 DIAGNOSIS — N390 Urinary tract infection, site not specified: Secondary | ICD-10-CM | POA: Diagnosis not present

## 2014-08-25 DIAGNOSIS — B961 Klebsiella pneumoniae [K. pneumoniae] as the cause of diseases classified elsewhere: Secondary | ICD-10-CM | POA: Diagnosis not present

## 2014-08-25 DIAGNOSIS — R102 Pelvic and perineal pain: Secondary | ICD-10-CM | POA: Diagnosis not present

## 2014-08-25 DIAGNOSIS — I4891 Unspecified atrial fibrillation: Secondary | ICD-10-CM | POA: Diagnosis not present

## 2014-08-27 DIAGNOSIS — I4891 Unspecified atrial fibrillation: Secondary | ICD-10-CM | POA: Diagnosis not present

## 2014-08-27 DIAGNOSIS — B961 Klebsiella pneumoniae [K. pneumoniae] as the cause of diseases classified elsewhere: Secondary | ICD-10-CM | POA: Diagnosis not present

## 2014-08-27 DIAGNOSIS — N390 Urinary tract infection, site not specified: Secondary | ICD-10-CM | POA: Diagnosis not present

## 2014-08-27 DIAGNOSIS — R102 Pelvic and perineal pain: Secondary | ICD-10-CM | POA: Diagnosis not present

## 2014-08-29 DIAGNOSIS — B961 Klebsiella pneumoniae [K. pneumoniae] as the cause of diseases classified elsewhere: Secondary | ICD-10-CM | POA: Diagnosis not present

## 2014-08-29 DIAGNOSIS — R102 Pelvic and perineal pain: Secondary | ICD-10-CM | POA: Diagnosis not present

## 2014-08-29 DIAGNOSIS — I4891 Unspecified atrial fibrillation: Secondary | ICD-10-CM | POA: Diagnosis not present

## 2014-08-29 DIAGNOSIS — N390 Urinary tract infection, site not specified: Secondary | ICD-10-CM | POA: Diagnosis not present

## 2014-08-31 DIAGNOSIS — R102 Pelvic and perineal pain: Secondary | ICD-10-CM | POA: Diagnosis not present

## 2014-08-31 DIAGNOSIS — B961 Klebsiella pneumoniae [K. pneumoniae] as the cause of diseases classified elsewhere: Secondary | ICD-10-CM | POA: Diagnosis not present

## 2014-08-31 DIAGNOSIS — N390 Urinary tract infection, site not specified: Secondary | ICD-10-CM | POA: Diagnosis not present

## 2014-08-31 DIAGNOSIS — I4891 Unspecified atrial fibrillation: Secondary | ICD-10-CM | POA: Diagnosis not present

## 2014-11-12 DIAGNOSIS — Z79899 Other long term (current) drug therapy: Secondary | ICD-10-CM | POA: Diagnosis not present

## 2014-11-12 DIAGNOSIS — I2510 Atherosclerotic heart disease of native coronary artery without angina pectoris: Secondary | ICD-10-CM | POA: Diagnosis not present

## 2014-11-12 DIAGNOSIS — I10 Essential (primary) hypertension: Secondary | ICD-10-CM | POA: Diagnosis not present

## 2014-11-18 DIAGNOSIS — I482 Chronic atrial fibrillation: Secondary | ICD-10-CM | POA: Diagnosis not present

## 2014-11-18 DIAGNOSIS — N184 Chronic kidney disease, stage 4 (severe): Secondary | ICD-10-CM | POA: Diagnosis not present

## 2014-11-18 DIAGNOSIS — Z6841 Body Mass Index (BMI) 40.0 and over, adult: Secondary | ICD-10-CM | POA: Diagnosis not present

## 2015-03-17 DIAGNOSIS — Z79899 Other long term (current) drug therapy: Secondary | ICD-10-CM | POA: Diagnosis not present

## 2015-03-17 DIAGNOSIS — I720 Aneurysm of carotid artery: Secondary | ICD-10-CM | POA: Diagnosis not present

## 2015-03-17 DIAGNOSIS — I2510 Atherosclerotic heart disease of native coronary artery without angina pectoris: Secondary | ICD-10-CM | POA: Diagnosis not present

## 2015-03-29 DIAGNOSIS — Z23 Encounter for immunization: Secondary | ICD-10-CM | POA: Diagnosis not present

## 2015-05-10 DIAGNOSIS — G4733 Obstructive sleep apnea (adult) (pediatric): Secondary | ICD-10-CM | POA: Diagnosis not present

## 2015-05-10 DIAGNOSIS — I482 Chronic atrial fibrillation: Secondary | ICD-10-CM | POA: Diagnosis not present

## 2015-05-10 DIAGNOSIS — N184 Chronic kidney disease, stage 4 (severe): Secondary | ICD-10-CM | POA: Diagnosis not present

## 2015-06-28 DIAGNOSIS — R55 Syncope and collapse: Secondary | ICD-10-CM | POA: Diagnosis not present

## 2015-06-28 DIAGNOSIS — R0682 Tachypnea, not elsewhere classified: Secondary | ICD-10-CM | POA: Diagnosis not present

## 2015-06-28 DIAGNOSIS — R404 Transient alteration of awareness: Secondary | ICD-10-CM | POA: Diagnosis not present

## 2015-08-26 DIAGNOSIS — Z79899 Other long term (current) drug therapy: Secondary | ICD-10-CM | POA: Diagnosis not present

## 2015-08-26 DIAGNOSIS — I6350 Cerebral infarction due to unspecified occlusion or stenosis of unspecified cerebral artery: Secondary | ICD-10-CM | POA: Diagnosis not present

## 2015-08-26 DIAGNOSIS — I2510 Atherosclerotic heart disease of native coronary artery without angina pectoris: Secondary | ICD-10-CM | POA: Diagnosis not present

## 2015-08-26 DIAGNOSIS — R55 Syncope and collapse: Secondary | ICD-10-CM | POA: Diagnosis not present

## 2015-08-30 ENCOUNTER — Encounter (HOSPITAL_COMMUNITY)
Admission: RE | Admit: 2015-08-30 | Discharge: 2015-08-30 | Disposition: A | Discharge status: Home / Self Care | Payer: Medicare Other | Source: Ambulatory Visit | Attending: Internal Medicine | Admitting: Internal Medicine

## 2015-08-30 DIAGNOSIS — D649 Anemia, unspecified: Secondary | ICD-10-CM | POA: Insufficient documentation

## 2015-08-30 LAB — PREPARE RBC (CROSSMATCH)

## 2015-08-30 LAB — HEMOGLOBIN AND HEMATOCRIT, BLOOD
HCT: 28 % — ABNORMAL LOW (ref 36.0–46.0)
Hemoglobin: 8.1 g/dL — ABNORMAL LOW (ref 12.0–15.0)

## 2015-08-31 ENCOUNTER — Encounter (HOSPITAL_COMMUNITY): Payer: Medicare Other

## 2015-08-31 ENCOUNTER — Encounter (HOSPITAL_COMMUNITY)
Admission: RE | Admit: 2015-08-31 | Discharge: 2015-08-31 | Disposition: A | Discharge status: Home / Self Care | Payer: Medicare Other | Source: Ambulatory Visit | Attending: Internal Medicine | Admitting: Internal Medicine

## 2015-08-31 DIAGNOSIS — R319 Hematuria, unspecified: Secondary | ICD-10-CM | POA: Diagnosis not present

## 2015-08-31 DIAGNOSIS — D649 Anemia, unspecified: Secondary | ICD-10-CM | POA: Diagnosis not present

## 2015-08-31 MED ORDER — SODIUM CHLORIDE 0.9 % IV SOLN
Freq: Once | INTRAVENOUS | Status: AC
  Administered 2015-08-31: 500 mL via INTRAVENOUS

## 2015-09-01 LAB — TYPE AND SCREEN
ABO/RH(D): A POS
ANTIBODY SCREEN: NEGATIVE
UNIT DIVISION: 0
UNIT DIVISION: 0

## 2015-09-02 DIAGNOSIS — R55 Syncope and collapse: Secondary | ICD-10-CM | POA: Diagnosis not present

## 2015-09-02 DIAGNOSIS — D509 Iron deficiency anemia, unspecified: Secondary | ICD-10-CM | POA: Diagnosis not present

## 2015-09-15 DIAGNOSIS — Z79899 Other long term (current) drug therapy: Secondary | ICD-10-CM | POA: Diagnosis not present

## 2015-09-15 DIAGNOSIS — N184 Chronic kidney disease, stage 4 (severe): Secondary | ICD-10-CM | POA: Diagnosis not present

## 2015-09-15 DIAGNOSIS — E875 Hyperkalemia: Secondary | ICD-10-CM | POA: Diagnosis not present

## 2015-09-15 DIAGNOSIS — D509 Iron deficiency anemia, unspecified: Secondary | ICD-10-CM | POA: Diagnosis not present

## 2015-09-15 DIAGNOSIS — D508 Other iron deficiency anemias: Secondary | ICD-10-CM | POA: Diagnosis not present

## 2015-09-21 ENCOUNTER — Encounter (INDEPENDENT_AMBULATORY_CARE_PROVIDER_SITE_OTHER): Payer: Self-pay | Admitting: Internal Medicine

## 2015-09-26 DIAGNOSIS — I720 Aneurysm of carotid artery: Secondary | ICD-10-CM | POA: Diagnosis not present

## 2015-09-26 DIAGNOSIS — D649 Anemia, unspecified: Secondary | ICD-10-CM | POA: Diagnosis not present

## 2015-09-26 DIAGNOSIS — I10 Essential (primary) hypertension: Secondary | ICD-10-CM | POA: Diagnosis not present

## 2015-09-26 DIAGNOSIS — I6350 Cerebral infarction due to unspecified occlusion or stenosis of unspecified cerebral artery: Secondary | ICD-10-CM | POA: Diagnosis not present

## 2015-09-26 DIAGNOSIS — Z79899 Other long term (current) drug therapy: Secondary | ICD-10-CM | POA: Diagnosis not present

## 2015-09-26 DIAGNOSIS — I2510 Atherosclerotic heart disease of native coronary artery without angina pectoris: Secondary | ICD-10-CM | POA: Diagnosis not present

## 2015-09-29 DIAGNOSIS — D509 Iron deficiency anemia, unspecified: Secondary | ICD-10-CM | POA: Diagnosis not present

## 2015-09-29 DIAGNOSIS — N184 Chronic kidney disease, stage 4 (severe): Secondary | ICD-10-CM | POA: Diagnosis not present

## 2015-09-29 DIAGNOSIS — M1990 Unspecified osteoarthritis, unspecified site: Secondary | ICD-10-CM | POA: Diagnosis not present

## 2015-10-12 DIAGNOSIS — D649 Anemia, unspecified: Secondary | ICD-10-CM | POA: Diagnosis not present

## 2015-10-13 ENCOUNTER — Encounter (INDEPENDENT_AMBULATORY_CARE_PROVIDER_SITE_OTHER): Payer: Self-pay | Admitting: *Deleted

## 2015-10-13 DIAGNOSIS — D509 Iron deficiency anemia, unspecified: Secondary | ICD-10-CM | POA: Diagnosis not present

## 2015-10-13 DIAGNOSIS — M4690 Unspecified inflammatory spondylopathy, site unspecified: Secondary | ICD-10-CM | POA: Diagnosis not present

## 2015-10-18 ENCOUNTER — Ambulatory Visit (INDEPENDENT_AMBULATORY_CARE_PROVIDER_SITE_OTHER): Payer: Medicare Other | Admitting: Internal Medicine

## 2015-10-18 ENCOUNTER — Encounter (INDEPENDENT_AMBULATORY_CARE_PROVIDER_SITE_OTHER): Payer: Self-pay | Admitting: Internal Medicine

## 2015-10-18 VITALS — BP 130/80 | HR 83 | Resp 16 | Wt 171.5 lb

## 2015-10-18 DIAGNOSIS — K219 Gastro-esophageal reflux disease without esophagitis: Secondary | ICD-10-CM | POA: Diagnosis not present

## 2015-10-18 DIAGNOSIS — R109 Unspecified abdominal pain: Secondary | ICD-10-CM

## 2015-10-18 DIAGNOSIS — G8929 Other chronic pain: Secondary | ICD-10-CM

## 2015-10-18 DIAGNOSIS — D509 Iron deficiency anemia, unspecified: Secondary | ICD-10-CM | POA: Diagnosis not present

## 2015-10-18 MED ORDER — FLINTSTONES PLUS IRON PO CHEW: 1.0000 | CHEWABLE_TABLET | Freq: Two times a day (BID) | ORAL | Status: DC

## 2015-10-18 NOTE — Progress Notes (Signed)
Reason for consultation;  Iron deficiency anemia.  History of present illness:  Patient is 82 year old Caucasian female who is referred through courtesy of Dr. Carylon Perchesoy Fagan for GI evaluation. She is accompanied by her daughter Hilda LiasMarie who has been living with her since March 2017. Asian was seen by Dr. Ouida SillsFagan about 7 weeks ago for progressive weakness. She was noted to be anemic with hemoglobin of 7.3 g and hematocrit was 25.2 and MCV was 74. Iron studies confirmed iron deficiency anemia. Her stool was guaiac-negative. She was given 2 units of PRBCs. She had H&H last week and hemoglobin is up to 11.4 g. Patient states in addition to feeling weak she also noted exertional dyspnea and had chest pain. She denies rectal bleeding hematuria or vaginal bleeding. She states she has had black stools but are.daughter does not think so. She complains of midabdominal pain but she's had this pain for few years. She has no pain today. She has chronic constipation and is using MiraLAX on as-needed basis. She is on low-dose aspirin but does not take other OTC NSAIDs.  Past GI history is as above. She had EGD back in March 2014 when she was hospitalized for atrial flutter fib with rapid ventricle response as well as C. difficile colitis. He also had iron deficiency anemia. EGD revealed erosive esophagitis gastritis without H. pylori and peptic duodenitis. She was treated with sucralfate and pantoprazole. Last colonoscopy was in 2006 when she presented with rectal bleeding and was diagnosed with ischemic colitis and she also had colonic diverticulosis.    Current Medications: Outpatient Encounter Prescriptions as of 10/18/2015  Medication Sig  . diltiazem (CARDIZEM CD) 240 MG 24 hr capsule Take 1 capsule (240 mg total) by mouth daily. (Patient taking differently: Take 120 mg by mouth 2 (two) times daily. )  . furosemide (LASIX) 20 MG tablet Take 20 mg by mouth every other day.   . losartan (COZAAR) 100 MG tablet Take  100 mg by mouth daily.   . metoprolol (LOPRESSOR) 50 MG tablet Take 1 tablet (50 mg total) by mouth 2 (two) times daily. (Patient taking differently: Take 25 mg by mouth 2 (two) times daily. )  . pantoprazole (PROTONIX) 40 MG tablet Take 1 tablet (40 mg total) by mouth daily.  . simvastatin (ZOCOR) 10 MG tablet Take 10 mg by mouth at bedtime.  . [DISCONTINUED] cephALEXin (KEFLEX) 250 MG capsule Take 1 capsule (250 mg total) by mouth 3 (three) times daily.   No facility-administered encounter medications on file as of 10/18/2015.   Past medical history: Hypertension. Street CHF. Hyperlipidemia. GERD. CVA December 2010 Erosive reflux esophagitis diagnosed in March 2014. CKD. Ischemic colitis 2006 She has bilateral ICA aneurysms. Osteoarthrosis. Status post right knee replacement. Left knee arthrotomy.   Allergies: Allergies  Allergen Reactions  . Ciprofloxacin Rash   Family history: Mother had TB and died at age 80 father lived to be 1748. She has one sister living and she is in the 4580s. 8 siblings have passed away mostly due to old age but one sister had cancer details unknown.  Social history: She is widowed. She worked [employer] for over 30 years. She has been retired for many years. He gave up driving several years ago. He had 5 children. One son died during infancy and another one due to leg gangrene at age 80. One died of brain cancer age 80. In addition to her daughter Hilda LiasMarie she has a son who is 80 years old.   Physical  examination: Blood pressure 130/80, pulse 83, resp. rate 16, weight 171 lb 8 oz (77.792 kg), SpO2 99 %. Patient is alert and in no acute distress.  She was able to move from chair to examination table with minimal assistance.  Conjunctiva is pink. Sclera is nonicteric Oropharyngeal mucosa is normal. No neck masses or thyromegaly noted. Cardiac exam with irregular rhythm normal S1 and S2. No murmur or gallop noted. Lungs are clear to  auscultation. Abdomen is symmetrical. Bowel sounds are normal. No bruits noted. On palpation abdomen is soft and nontender without organomegaly or masses. Rectal examination reveals brown guaiac-negative stool. She has 1+ pitting edema around ankles.  Labs/studies Results: Lab data from 08/26/2015  H&H 7.3 and 25.2 MCV 74. WBC 6.3 and platelet count of 315K  BUN 40 and creatinine 1.97  Serum iron 33 TIBC 357 saturation 11% and ferritin 12.  Lab data from 10/12/2015  H&H 11.4 and 37.6  Serum iron 32 TIBC 287 saturation 11% and ferritin 17  B12 349 and folate 18.9.    Assessment:  #1. Iron deficiency anemia. Patient was diagnosed with iron deficiency anemia about 7 weeks ago and required 2 units of PRBCs. Her hemoglobin has stayed. There is no history of melena or rectal bleeding. Her stool is guaiac negative today as it was when Dr. Ouida Sills saw her. She does have history of iron deficiency anemia in March 2014 when her stool was heme positive. She has been on chronic PPI therapy for erosive reflux esophagitis. Differential diagnoses include impaired iron absorption due to chronic PPI therapy or she could be losing blood intermittently from her GI tract. Was likely diagnosis would be small bowel angiodysplasia. Options include observation versus diagnostic evaluation. Since there is no evidence of overt or occult GI bleed it would be reasonable to monitor her course of the next few weeks and determine if she should undergo further evaluation. Both patient and her daughter are in agreement.  #2. History of erosive reflux esophagitis. Symptoms well controlled with therapy. #3. Chronic abdominal pain which appears to have nothing to do with her acute illness.  Recommendations :  Flintstone with iron 2 tablets daily. Hemoccult 1 if stools noted to be black or dark. CBC in 4 weeks. Office visit in 8 weeks.

## 2015-10-18 NOTE — Patient Instructions (Signed)
Notify if you have rectal bleeding. Hemoccult 1 if stool is black. CBC in 4 weeks.

## 2015-10-21 ENCOUNTER — Encounter (INDEPENDENT_AMBULATORY_CARE_PROVIDER_SITE_OTHER): Payer: Self-pay

## 2015-11-03 ENCOUNTER — Other Ambulatory Visit (INDEPENDENT_AMBULATORY_CARE_PROVIDER_SITE_OTHER): Payer: Self-pay | Admitting: *Deleted

## 2015-11-03 ENCOUNTER — Encounter (INDEPENDENT_AMBULATORY_CARE_PROVIDER_SITE_OTHER): Payer: Self-pay | Admitting: *Deleted

## 2015-11-03 DIAGNOSIS — D509 Iron deficiency anemia, unspecified: Secondary | ICD-10-CM

## 2015-11-03 DIAGNOSIS — G8929 Other chronic pain: Secondary | ICD-10-CM

## 2015-11-03 DIAGNOSIS — R109 Unspecified abdominal pain: Secondary | ICD-10-CM

## 2015-11-03 DIAGNOSIS — K219 Gastro-esophageal reflux disease without esophagitis: Secondary | ICD-10-CM

## 2015-11-09 DIAGNOSIS — K219 Gastro-esophageal reflux disease without esophagitis: Secondary | ICD-10-CM | POA: Diagnosis not present

## 2015-11-09 DIAGNOSIS — G8929 Other chronic pain: Secondary | ICD-10-CM | POA: Diagnosis not present

## 2015-11-09 DIAGNOSIS — R109 Unspecified abdominal pain: Secondary | ICD-10-CM | POA: Diagnosis not present

## 2015-11-09 DIAGNOSIS — D509 Iron deficiency anemia, unspecified: Secondary | ICD-10-CM | POA: Diagnosis not present

## 2015-11-10 ENCOUNTER — Other Ambulatory Visit (INDEPENDENT_AMBULATORY_CARE_PROVIDER_SITE_OTHER): Payer: Self-pay | Admitting: *Deleted

## 2015-11-10 DIAGNOSIS — D509 Iron deficiency anemia, unspecified: Secondary | ICD-10-CM

## 2015-11-10 LAB — CBC
HCT: 42 % (ref 35.0–45.0)
Hemoglobin: 13 g/dL (ref 11.7–15.5)
MCH: 25.2 pg — AB (ref 27.0–33.0)
MCHC: 31 g/dL — AB (ref 32.0–36.0)
MCV: 81.4 fL (ref 80.0–100.0)
PLATELETS: 195 10*3/uL (ref 140–400)
RBC: 5.16 MIL/uL — ABNORMAL HIGH (ref 3.80–5.10)
RDW: 22.6 % — ABNORMAL HIGH (ref 11.0–15.0)
WBC: 7.5 10*3/uL (ref 3.8–10.8)

## 2015-11-16 DIAGNOSIS — D509 Iron deficiency anemia, unspecified: Secondary | ICD-10-CM | POA: Diagnosis not present

## 2015-11-16 DIAGNOSIS — M1990 Unspecified osteoarthritis, unspecified site: Secondary | ICD-10-CM | POA: Diagnosis not present

## 2015-11-16 DIAGNOSIS — N184 Chronic kidney disease, stage 4 (severe): Secondary | ICD-10-CM | POA: Diagnosis not present

## 2015-12-05 ENCOUNTER — Encounter (INDEPENDENT_AMBULATORY_CARE_PROVIDER_SITE_OTHER): Payer: Self-pay | Admitting: *Deleted

## 2015-12-05 ENCOUNTER — Other Ambulatory Visit (INDEPENDENT_AMBULATORY_CARE_PROVIDER_SITE_OTHER): Payer: Self-pay | Admitting: *Deleted

## 2015-12-05 DIAGNOSIS — D509 Iron deficiency anemia, unspecified: Secondary | ICD-10-CM

## 2015-12-16 DIAGNOSIS — D509 Iron deficiency anemia, unspecified: Secondary | ICD-10-CM | POA: Diagnosis not present

## 2015-12-17 LAB — CBC
HCT: 44.1 % (ref 35.0–45.0)
Hemoglobin: 14 g/dL (ref 11.7–15.5)
MCH: 27.1 pg (ref 27.0–33.0)
MCHC: 31.7 g/dL — ABNORMAL LOW (ref 32.0–36.0)
MCV: 85.3 fL (ref 80.0–100.0)
Platelets: 208 K/uL (ref 140–400)
RBC: 5.17 MIL/uL — ABNORMAL HIGH (ref 3.80–5.10)
RDW: 19.4 % — ABNORMAL HIGH (ref 11.0–15.0)
WBC: 9.1 K/uL (ref 3.8–10.8)

## 2015-12-27 DIAGNOSIS — Z23 Encounter for immunization: Secondary | ICD-10-CM | POA: Diagnosis not present

## 2015-12-27 DIAGNOSIS — I5022 Chronic systolic (congestive) heart failure: Secondary | ICD-10-CM | POA: Diagnosis not present

## 2015-12-27 DIAGNOSIS — D509 Iron deficiency anemia, unspecified: Secondary | ICD-10-CM | POA: Diagnosis not present

## 2015-12-27 DIAGNOSIS — M059 Rheumatoid arthritis with rheumatoid factor, unspecified: Secondary | ICD-10-CM | POA: Diagnosis not present

## 2016-01-03 ENCOUNTER — Ambulatory Visit (INDEPENDENT_AMBULATORY_CARE_PROVIDER_SITE_OTHER): Payer: Medicare Other | Admitting: Internal Medicine

## 2016-02-16 DIAGNOSIS — I5032 Chronic diastolic (congestive) heart failure: Secondary | ICD-10-CM | POA: Diagnosis not present

## 2016-02-16 DIAGNOSIS — Z79899 Other long term (current) drug therapy: Secondary | ICD-10-CM | POA: Diagnosis not present

## 2016-02-16 DIAGNOSIS — D508 Other iron deficiency anemias: Secondary | ICD-10-CM | POA: Diagnosis not present

## 2016-02-21 DIAGNOSIS — I5022 Chronic systolic (congestive) heart failure: Secondary | ICD-10-CM | POA: Diagnosis not present

## 2016-02-21 DIAGNOSIS — M059 Rheumatoid arthritis with rheumatoid factor, unspecified: Secondary | ICD-10-CM | POA: Diagnosis not present

## 2016-02-21 DIAGNOSIS — D509 Iron deficiency anemia, unspecified: Secondary | ICD-10-CM | POA: Diagnosis not present

## 2016-04-30 DIAGNOSIS — I2510 Atherosclerotic heart disease of native coronary artery without angina pectoris: Secondary | ICD-10-CM | POA: Diagnosis not present

## 2016-04-30 DIAGNOSIS — N183 Chronic kidney disease, stage 3 (moderate): Secondary | ICD-10-CM | POA: Diagnosis not present

## 2016-04-30 DIAGNOSIS — I5030 Unspecified diastolic (congestive) heart failure: Secondary | ICD-10-CM | POA: Diagnosis not present

## 2016-04-30 DIAGNOSIS — I6350 Cerebral infarction due to unspecified occlusion or stenosis of unspecified cerebral artery: Secondary | ICD-10-CM | POA: Diagnosis not present

## 2016-04-30 DIAGNOSIS — I482 Chronic atrial fibrillation: Secondary | ICD-10-CM | POA: Diagnosis not present

## 2016-05-04 DIAGNOSIS — M05742 Rheumatoid arthritis with rheumatoid factor of left hand without organ or systems involvement: Secondary | ICD-10-CM | POA: Diagnosis not present

## 2016-05-04 DIAGNOSIS — I5032 Chronic diastolic (congestive) heart failure: Secondary | ICD-10-CM | POA: Diagnosis not present

## 2016-05-04 DIAGNOSIS — M05741 Rheumatoid arthritis with rheumatoid factor of right hand without organ or systems involvement: Secondary | ICD-10-CM | POA: Diagnosis not present

## 2016-05-04 DIAGNOSIS — N183 Chronic kidney disease, stage 3 (moderate): Secondary | ICD-10-CM | POA: Diagnosis not present

## 2016-06-09 IMAGING — CT CT ABD-PELV W/O CM
2 of 8 series · 13 of 46 positions shown, 18 images · non-contrast
Comparison: December 04, 2008

CLINICAL DATA: Abdominal and left inguinal region pain

EXAM:
CT ABDOMEN AND PELVIS WITHOUT CONTRAST
TECHNIQUE: Multidetector CT imaging of the abdomen and pelvis was performed
following the standard protocol without IV contrast. The patient did
receive oral contrast.

[Series 2: abdomen/pelvis w/o contrast · axial · non-contrast · 0.66mm/px · z∈[-619,-264]mm · 10 of 85 slices shown, 15 images]
[im 7/85  soft-tissue]
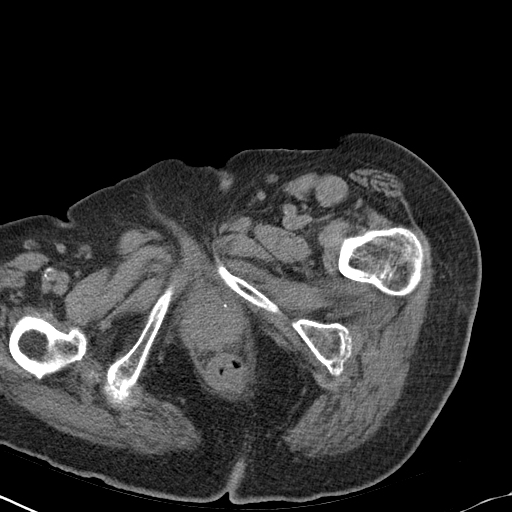
[im 7/85  bone]
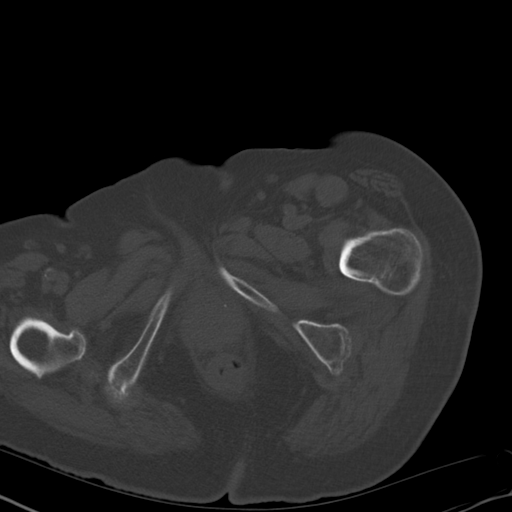
[im 20/85  soft-tissue]
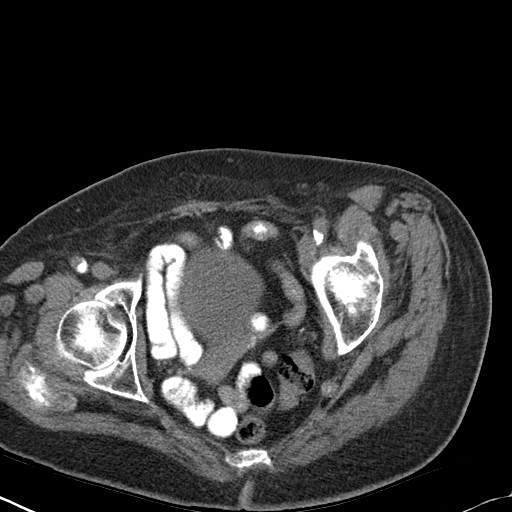
[im 26/85  soft-tissue]
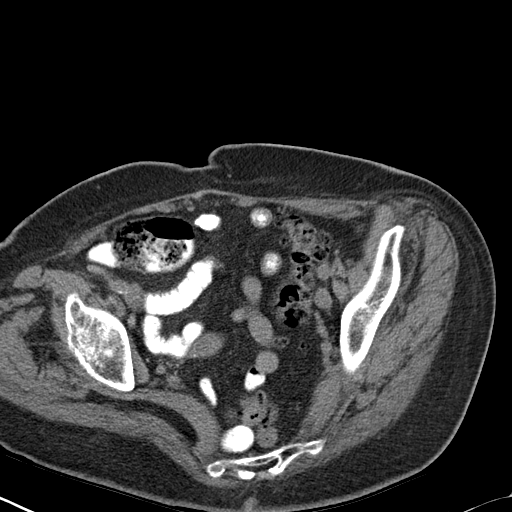
[im 33/85  soft-tissue]
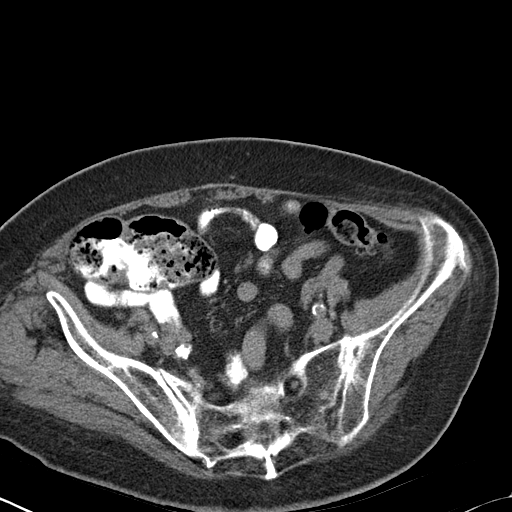
[im 46/85  soft-tissue]
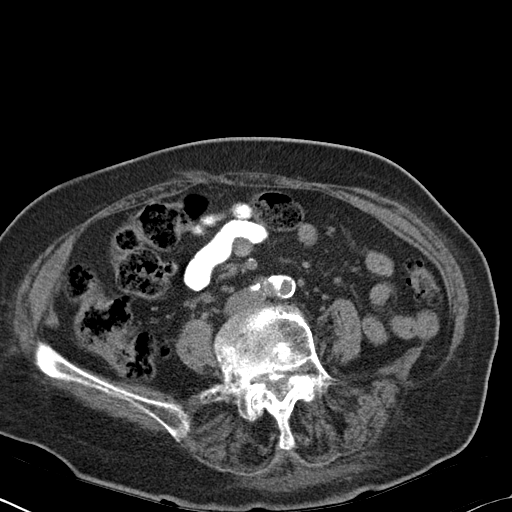
[im 52/85  soft-tissue]
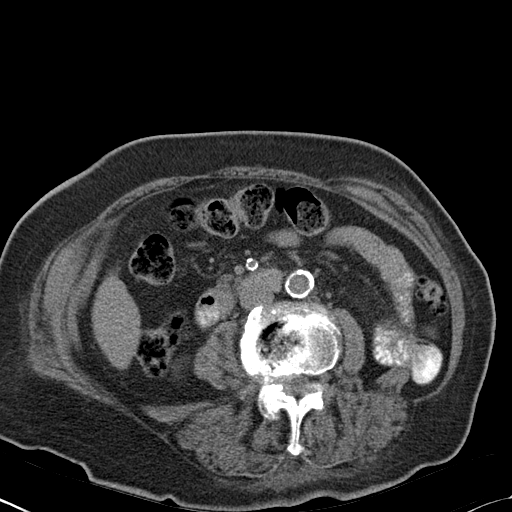
[im 59/85  soft-tissue]
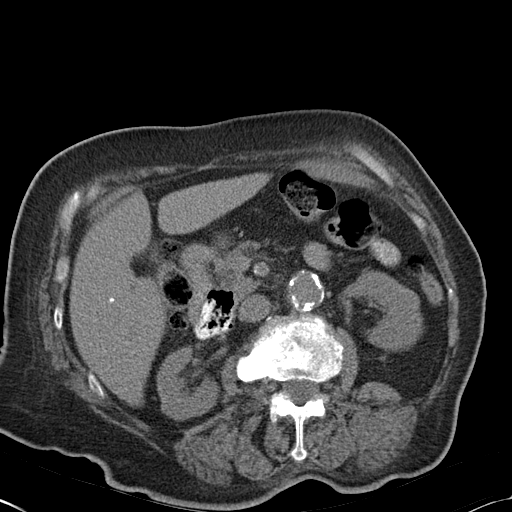
[im 59/85  lung]
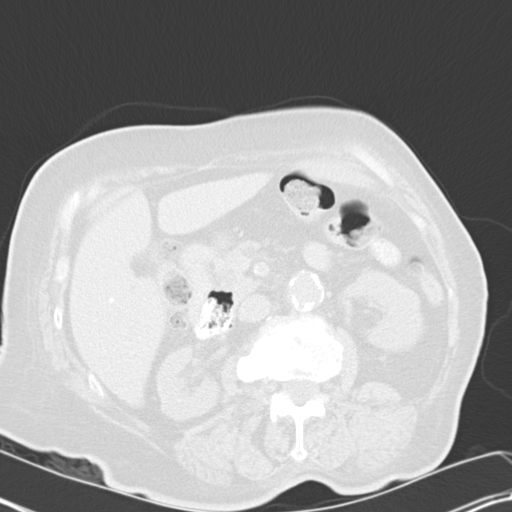
[im 65/85  lung]
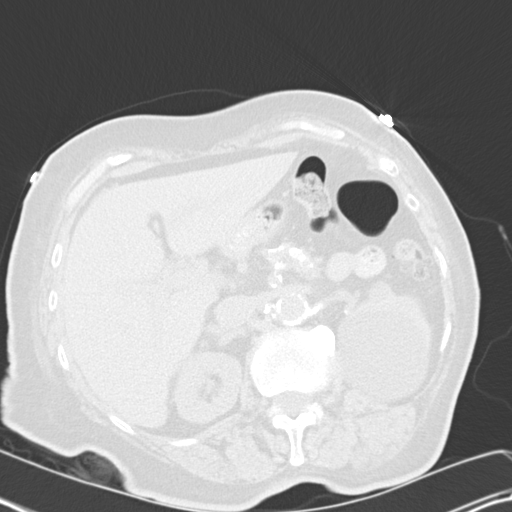
[im 72/85  soft-tissue]
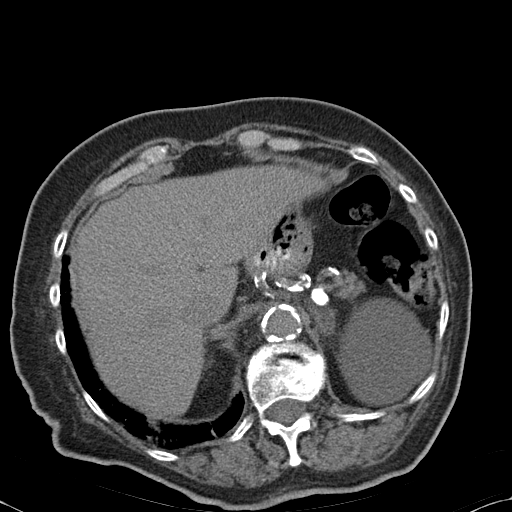
[im 72/85  lung]
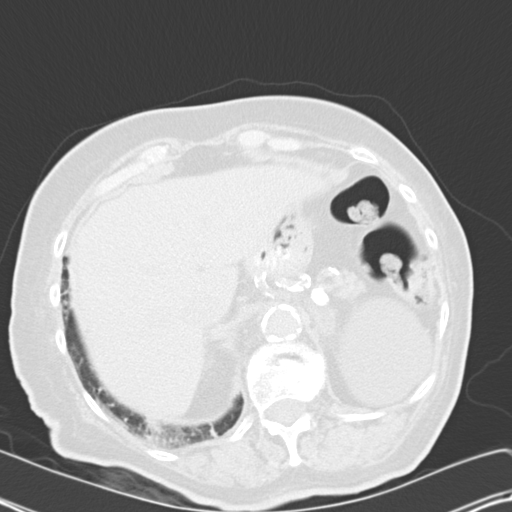
[im 78/85  soft-tissue]
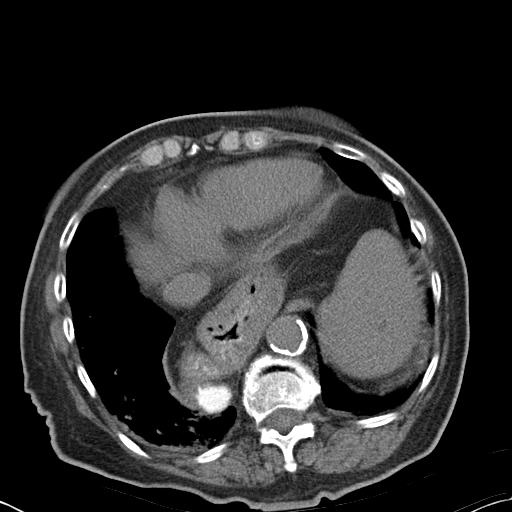
[im 78/85  lung]
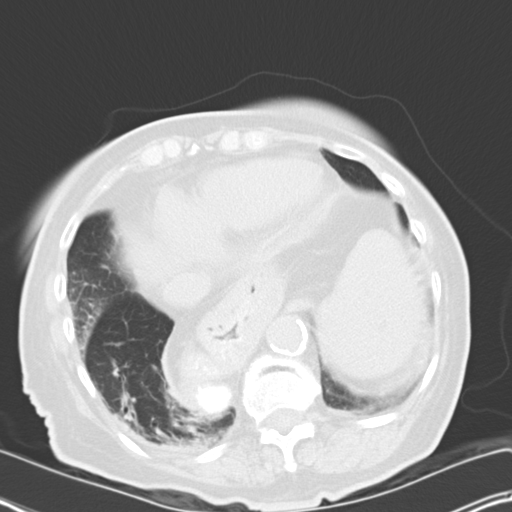
[im 78/85  bone]
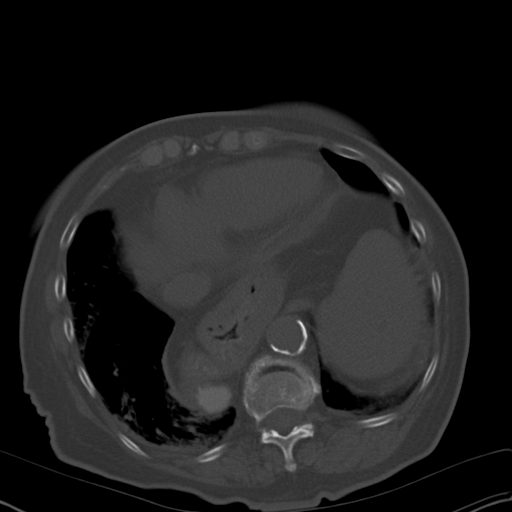

[Series 3: mpr cor (id) · coronal · 0.71mm/px · 3 of 87 slices shown]
[im 22/87  soft-tissue]
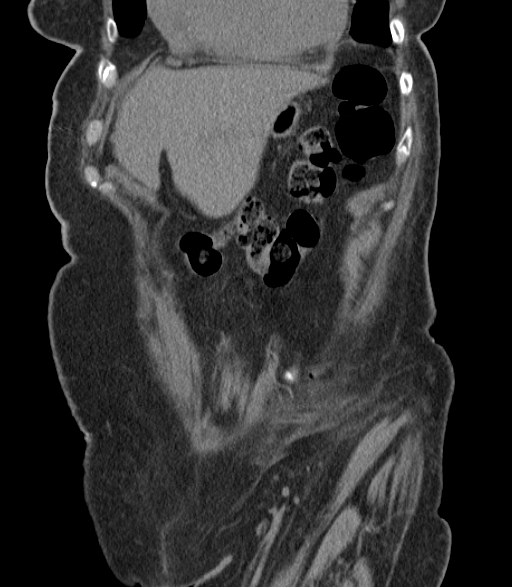
[im 44/87  soft-tissue]
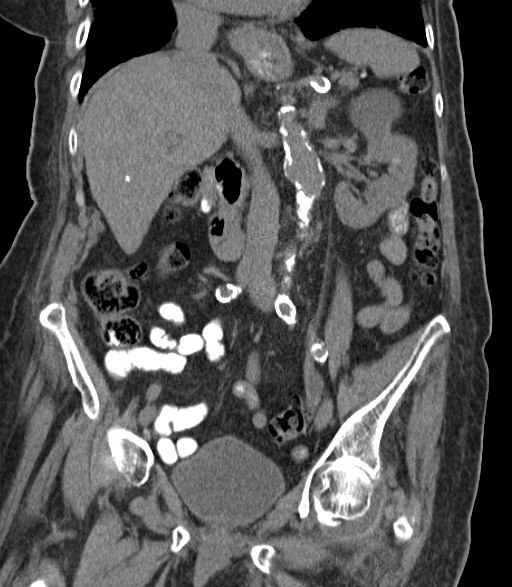
[im 65/87  soft-tissue]
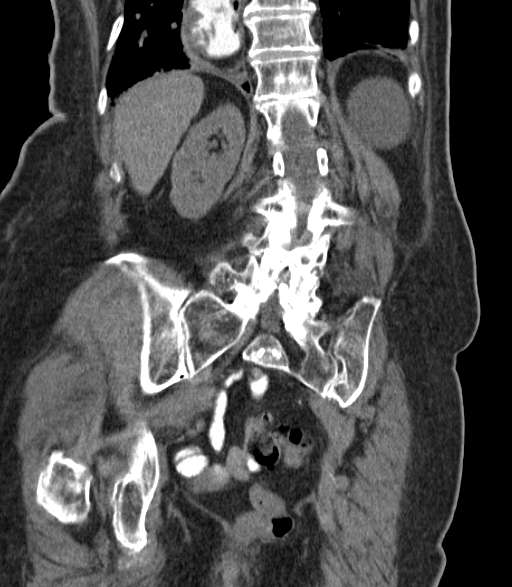

[13 of 46 positions shown; findings below may reference images not displayed]

FINDINGS: There is atelectatic change in the lung bases, more on the right
than on the left. There is no frank airspace consolidation in the
bases. There is a sizable paraesophageal hernia with much of the
stomach above the diaphragm. There are foci of coronary artery
calcification as well as calcification in the mitral annulus.

There is a small calcified granuloma in the right lobe of the liver.
No other focal liver lesions are identified on this noncontrast
enhanced study. The gallbladder is absent. There is no appreciable
biliary duct dilatation.

Spleen is normal in size and contour. There is calcification in the
splenic artery. Pancreas is somewhat atrophic without mass or
inflammation. Adrenals appear within normal limits bilaterally.

There is a 6.5 x 6.5 cm cyst arising from the upper pole of the left
kidney. A second cyst arising from the upper pole of the left kidney
measures 1.4 x 1.3 cm. There is no hydronephrosis on either side.
There is no renal or ureteral calculus on either side.

In the pelvis, the urinary bladder is midline with normal wall
thickness. There are scattered sigmoid diverticula without
diverticulitis. There is no pelvic mass or pelvic fluid collection.
There is no periappendiceal region inflammation. No
inguinal/periinguinal lesions are appreciated on either side.

There is extensive atherosclerotic change in the aorta. The aorta is
tortuous. There is slight dilatation in the mid aortic region with a
maximum transverse diameter of 3.1 x 2.8 cm. There is no periaortic
fluid.

There is no bowel obstruction. No free air or portal venous air.
There is no appreciable ascites, adenopathy, or abscess in the
abdomen or pelvis. There is degenerative change in the lumbar spine.
There are no blastic or lytic bone lesions. There is lumbar
levoscoliosis.
IMPRESSION: There is a sizable paraesophageal type hernia, stable.

Large left renal cysts, present previously.

No bowel obstruction. No abscess. No periappendiceal region
inflammation. No renal or ureteral calculus. No hydronephrosis.
There are scattered sigmoid diverticula without diverticulitis.
There is extensive atherosclerotic change. There is mild dilatation
in the mid abdominal aorta, a finding that was also present on prior
study.

## 2016-06-09 IMAGING — DX DG PELVIS 1-2V
1 series · 1 of 1 positions shown · non-contrast
Comparison: None.

CLINICAL DATA: Lower pelvic pain, left groin pain

EXAM:
PELVIS - 1-2 VIEW

[pelvis ap]
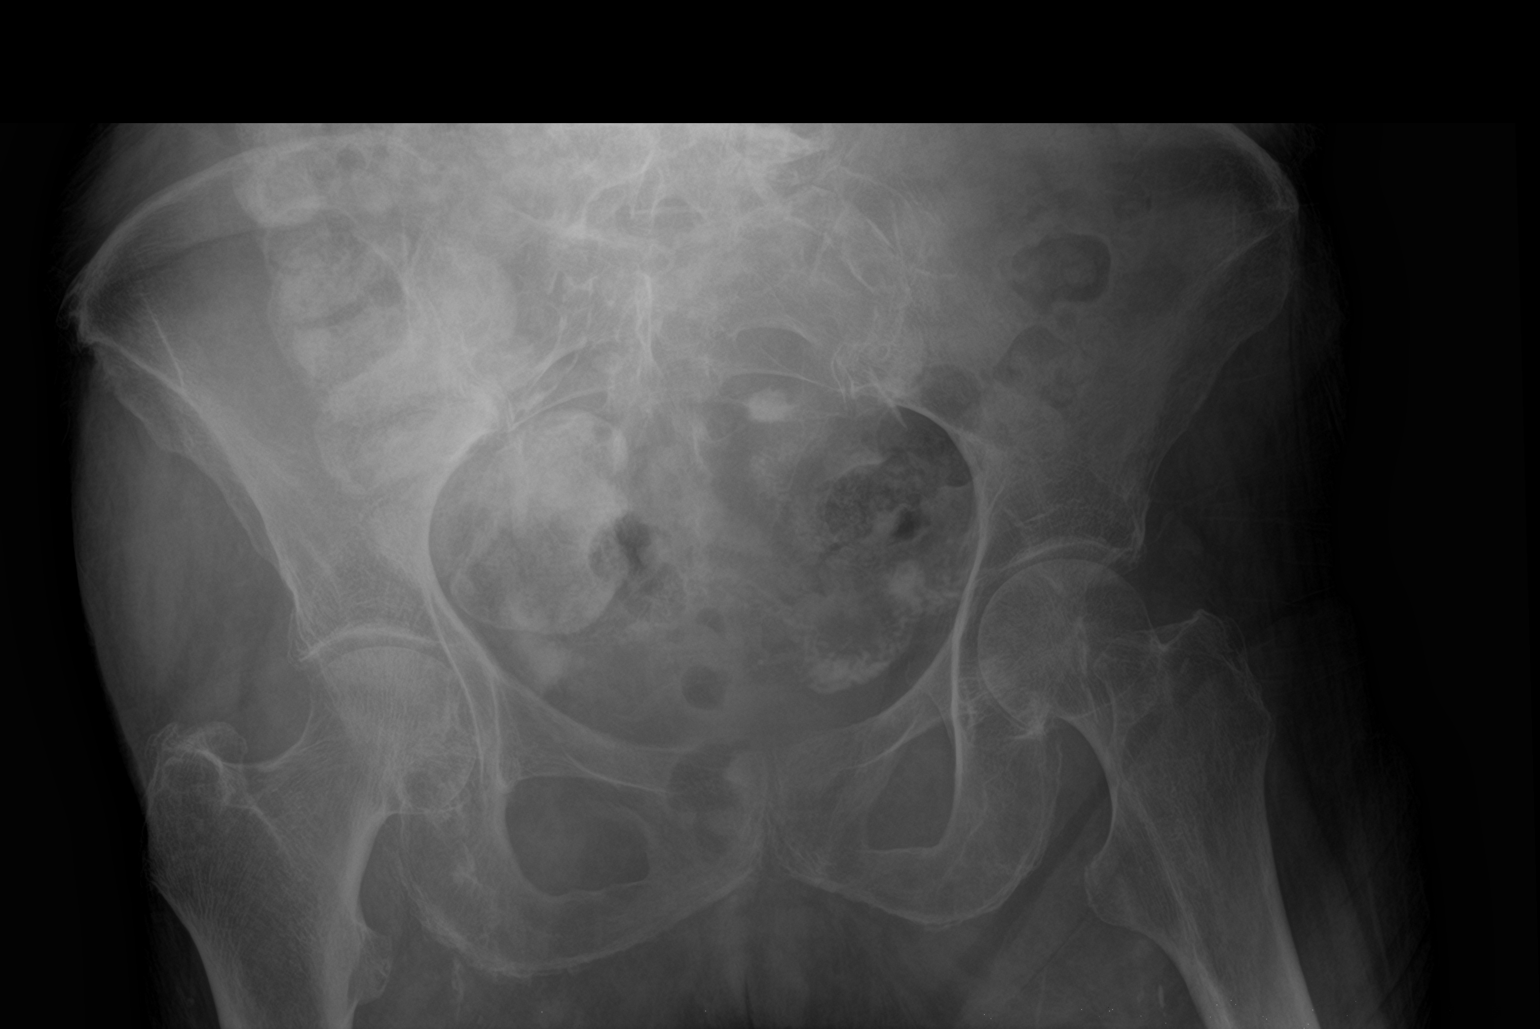

[1 of 1 positions shown; findings below may reference images not displayed]

FINDINGS: There is no evidence of pelvic fracture or diastasis. No pelvic bone
lesions are seen. There is generalized osteopenia. There is
peripheral vascular atherosclerotic disease.
IMPRESSION: No acute osseous injury of the pelvis.

## 2016-07-26 DIAGNOSIS — I482 Chronic atrial fibrillation: Secondary | ICD-10-CM | POA: Diagnosis not present

## 2016-07-26 DIAGNOSIS — N183 Chronic kidney disease, stage 3 (moderate): Secondary | ICD-10-CM | POA: Diagnosis not present

## 2016-07-26 DIAGNOSIS — D649 Anemia, unspecified: Secondary | ICD-10-CM | POA: Diagnosis not present

## 2016-07-26 DIAGNOSIS — Z79899 Other long term (current) drug therapy: Secondary | ICD-10-CM | POA: Diagnosis not present

## 2016-07-29 ENCOUNTER — Inpatient Hospital Stay (HOSPITAL_COMMUNITY)
Admission: EM | Admit: 2016-07-29 | Discharge: 2016-08-02 | DRG: 690 | Disposition: A | Discharge status: Home Health | Payer: Medicare Other | Attending: Internal Medicine | Admitting: Internal Medicine

## 2016-07-29 ENCOUNTER — Emergency Department (HOSPITAL_COMMUNITY): Payer: Medicare Other

## 2016-07-29 ENCOUNTER — Encounter (HOSPITAL_COMMUNITY): Payer: Self-pay | Admitting: *Deleted

## 2016-07-29 DIAGNOSIS — N390 Urinary tract infection, site not specified: Secondary | ICD-10-CM | POA: Diagnosis not present

## 2016-07-29 DIAGNOSIS — K2100 Gastro-esophageal reflux disease with esophagitis, without bleeding: Secondary | ICD-10-CM

## 2016-07-29 DIAGNOSIS — R300 Dysuria: Secondary | ICD-10-CM | POA: Diagnosis not present

## 2016-07-29 DIAGNOSIS — R748 Abnormal levels of other serum enzymes: Secondary | ICD-10-CM | POA: Diagnosis not present

## 2016-07-29 DIAGNOSIS — N183 Chronic kidney disease, stage 3 (moderate): Secondary | ICD-10-CM | POA: Diagnosis not present

## 2016-07-29 DIAGNOSIS — I5042 Chronic combined systolic (congestive) and diastolic (congestive) heart failure: Secondary | ICD-10-CM | POA: Diagnosis present

## 2016-07-29 DIAGNOSIS — M25462 Effusion, left knee: Secondary | ICD-10-CM | POA: Diagnosis present

## 2016-07-29 DIAGNOSIS — R0989 Other specified symptoms and signs involving the circulatory and respiratory systems: Secondary | ICD-10-CM | POA: Diagnosis not present

## 2016-07-29 DIAGNOSIS — I482 Chronic atrial fibrillation: Secondary | ICD-10-CM | POA: Diagnosis not present

## 2016-07-29 DIAGNOSIS — I2510 Atherosclerotic heart disease of native coronary artery without angina pectoris: Secondary | ICD-10-CM

## 2016-07-29 DIAGNOSIS — Z8673 Personal history of transient ischemic attack (TIA), and cerebral infarction without residual deficits: Secondary | ICD-10-CM | POA: Diagnosis not present

## 2016-07-29 DIAGNOSIS — Z79899 Other long term (current) drug therapy: Secondary | ICD-10-CM | POA: Diagnosis not present

## 2016-07-29 DIAGNOSIS — R531 Weakness: Secondary | ICD-10-CM | POA: Diagnosis not present

## 2016-07-29 DIAGNOSIS — M79606 Pain in leg, unspecified: Secondary | ICD-10-CM | POA: Diagnosis not present

## 2016-07-29 DIAGNOSIS — R404 Transient alteration of awareness: Secondary | ICD-10-CM | POA: Diagnosis not present

## 2016-07-29 DIAGNOSIS — I252 Old myocardial infarction: Secondary | ICD-10-CM | POA: Diagnosis not present

## 2016-07-29 DIAGNOSIS — R1030 Lower abdominal pain, unspecified: Secondary | ICD-10-CM | POA: Diagnosis present

## 2016-07-29 DIAGNOSIS — Z881 Allergy status to other antibiotic agents status: Secondary | ICD-10-CM | POA: Diagnosis not present

## 2016-07-29 DIAGNOSIS — D649 Anemia, unspecified: Secondary | ICD-10-CM | POA: Diagnosis not present

## 2016-07-29 DIAGNOSIS — R061 Stridor: Secondary | ICD-10-CM | POA: Diagnosis not present

## 2016-07-29 DIAGNOSIS — N3001 Acute cystitis with hematuria: Secondary | ICD-10-CM | POA: Diagnosis not present

## 2016-07-29 DIAGNOSIS — I5032 Chronic diastolic (congestive) heart failure: Secondary | ICD-10-CM | POA: Diagnosis not present

## 2016-07-29 DIAGNOSIS — Z66 Do not resuscitate: Secondary | ICD-10-CM | POA: Diagnosis present

## 2016-07-29 DIAGNOSIS — I517 Cardiomegaly: Secondary | ICD-10-CM | POA: Diagnosis not present

## 2016-07-29 DIAGNOSIS — R0602 Shortness of breath: Secondary | ICD-10-CM | POA: Diagnosis not present

## 2016-07-29 DIAGNOSIS — M1712 Unilateral primary osteoarthritis, left knee: Secondary | ICD-10-CM | POA: Diagnosis present

## 2016-07-29 DIAGNOSIS — D509 Iron deficiency anemia, unspecified: Secondary | ICD-10-CM | POA: Diagnosis present

## 2016-07-29 DIAGNOSIS — R911 Solitary pulmonary nodule: Secondary | ICD-10-CM | POA: Diagnosis present

## 2016-07-29 DIAGNOSIS — E785 Hyperlipidemia, unspecified: Secondary | ICD-10-CM | POA: Diagnosis present

## 2016-07-29 DIAGNOSIS — B9620 Unspecified Escherichia coli [E. coli] as the cause of diseases classified elsewhere: Secondary | ICD-10-CM | POA: Diagnosis present

## 2016-07-29 DIAGNOSIS — R7989 Other specified abnormal findings of blood chemistry: Secondary | ICD-10-CM

## 2016-07-29 DIAGNOSIS — R778 Other specified abnormalities of plasma proteins: Secondary | ICD-10-CM | POA: Diagnosis present

## 2016-07-29 DIAGNOSIS — J9801 Acute bronchospasm: Secondary | ICD-10-CM | POA: Diagnosis not present

## 2016-07-29 DIAGNOSIS — I130 Hypertensive heart and chronic kidney disease with heart failure and stage 1 through stage 4 chronic kidney disease, or unspecified chronic kidney disease: Secondary | ICD-10-CM | POA: Diagnosis present

## 2016-07-29 DIAGNOSIS — I10 Essential (primary) hypertension: Secondary | ICD-10-CM

## 2016-07-29 DIAGNOSIS — K219 Gastro-esophageal reflux disease without esophagitis: Secondary | ICD-10-CM | POA: Diagnosis present

## 2016-07-29 DIAGNOSIS — R0600 Dyspnea, unspecified: Secondary | ICD-10-CM

## 2016-07-29 DIAGNOSIS — R918 Other nonspecific abnormal finding of lung field: Secondary | ICD-10-CM | POA: Diagnosis not present

## 2016-07-29 DIAGNOSIS — M25562 Pain in left knee: Secondary | ICD-10-CM

## 2016-07-29 DIAGNOSIS — I4891 Unspecified atrial fibrillation: Secondary | ICD-10-CM | POA: Diagnosis present

## 2016-07-29 DIAGNOSIS — R609 Edema, unspecified: Secondary | ICD-10-CM | POA: Diagnosis not present

## 2016-07-29 HISTORY — DX: Old myocardial infarction: I25.2

## 2016-07-29 LAB — BASIC METABOLIC PANEL
ANION GAP: 6 (ref 5–15)
BUN: 19 mg/dL (ref 6–20)
CALCIUM: 8.5 mg/dL — AB (ref 8.9–10.3)
CO2: 27 mmol/L (ref 22–32)
Chloride: 100 mmol/L — ABNORMAL LOW (ref 101–111)
Creatinine, Ser: 1.38 mg/dL — ABNORMAL HIGH (ref 0.44–1.00)
GFR calc non Af Amer: 31 mL/min — ABNORMAL LOW (ref >60)
GFR, EST AFRICAN AMERICAN: 36 mL/min — AB (ref >60)
Glucose, Bld: 133 mg/dL — ABNORMAL HIGH (ref 65–99)
POTASSIUM: 3.6 mmol/L (ref 3.5–5.1)
Sodium: 133 mmol/L — ABNORMAL LOW (ref 135–145)

## 2016-07-29 LAB — URINALYSIS, ROUTINE W REFLEX MICROSCOPIC
Bilirubin Urine: NEGATIVE
Glucose, UA: NEGATIVE mg/dL
KETONES UR: NEGATIVE mg/dL
Nitrite: NEGATIVE
PH: 5 (ref 5.0–8.0)
Protein, ur: 100 mg/dL — AB
SPECIFIC GRAVITY, URINE: 1.018 (ref 1.005–1.030)

## 2016-07-29 LAB — CBC WITH DIFFERENTIAL/PLATELET
BASOS ABS: 0 10*3/uL (ref 0.0–0.1)
Basophils Relative: 0 %
EOS ABS: 0 10*3/uL (ref 0.0–0.7)
EOS PCT: 0 %
HCT: 33.5 % — ABNORMAL LOW (ref 36.0–46.0)
Hemoglobin: 10.3 g/dL — ABNORMAL LOW (ref 12.0–15.0)
LYMPHS PCT: 10 %
Lymphs Abs: 0.9 10*3/uL (ref 0.7–4.0)
MCH: 23.8 pg — ABNORMAL LOW (ref 26.0–34.0)
MCHC: 30.7 g/dL (ref 30.0–36.0)
MCV: 77.4 fL — AB (ref 78.0–100.0)
Monocytes Absolute: 2 10*3/uL — ABNORMAL HIGH (ref 0.1–1.0)
Monocytes Relative: 21 %
NEUTROS PCT: 69 %
Neutro Abs: 6.4 10*3/uL (ref 1.7–7.7)
PLATELETS: 211 10*3/uL (ref 150–400)
RBC: 4.33 MIL/uL (ref 3.87–5.11)
RDW: 16.4 % — ABNORMAL HIGH (ref 11.5–15.5)
WBC: 9.4 10*3/uL (ref 4.0–10.5)

## 2016-07-29 LAB — BRAIN NATRIURETIC PEPTIDE: B Natriuretic Peptide: 557 pg/mL — ABNORMAL HIGH (ref 0.0–100.0)

## 2016-07-29 LAB — TROPONIN I: Troponin I: 0.28 ng/mL (ref <0.03)

## 2016-07-29 MED ORDER — ALBUTEROL SULFATE (2.5 MG/3ML) 0.083% IN NEBU: 2.5000 mg | INHALATION_SOLUTION | Freq: Four times a day (QID) | RESPIRATORY_TRACT | Status: DC | PRN

## 2016-07-29 MED ORDER — METOPROLOL TARTRATE 25 MG PO TABS
25.0000 mg | ORAL_TABLET | Freq: Two times a day (BID) | ORAL | Status: DC
  Administered 2016-07-30 – 2016-08-02 (×8): 25 mg via ORAL
  Filled 2016-07-29 (×8): qty 1

## 2016-07-29 MED ORDER — DEXTROSE 5 % IV SOLN
1.0000 g | INTRAVENOUS | Status: DC
  Administered 2016-07-30 – 2016-08-01 (×3): 1 g via INTRAVENOUS
  Filled 2016-07-29 (×4): qty 10

## 2016-07-29 MED ORDER — SIMVASTATIN 10 MG PO TABS
10.0000 mg | ORAL_TABLET | Freq: Every day | ORAL | Status: DC
  Administered 2016-07-30 – 2016-08-01 (×3): 10 mg via ORAL
  Filled 2016-07-29 (×3): qty 1

## 2016-07-29 MED ORDER — ACETAMINOPHEN 325 MG PO TABS: 650.0000 mg | ORAL_TABLET | Freq: Four times a day (QID) | ORAL | Status: DC | PRN

## 2016-07-29 MED ORDER — LOSARTAN POTASSIUM 50 MG PO TABS
100.0000 mg | ORAL_TABLET | Freq: Every day | ORAL | Status: DC
  Administered 2016-07-30 – 2016-08-02 (×4): 100 mg via ORAL
  Filled 2016-07-29 (×4): qty 2

## 2016-07-29 MED ORDER — FUROSEMIDE 40 MG PO TABS: 20.0000 mg | ORAL_TABLET | ORAL | Status: DC

## 2016-07-29 MED ORDER — POLYETHYLENE GLYCOL 3350 17 G PO PACK: 17.0000 g | PACK | Freq: Every day | ORAL | Status: DC | PRN

## 2016-07-29 MED ORDER — ACETAMINOPHEN 650 MG RE SUPP: 650.0000 mg | Freq: Four times a day (QID) | RECTAL | Status: DC | PRN

## 2016-07-29 MED ORDER — SODIUM CHLORIDE 0.9% FLUSH: 3.0000 mL | INTRAVENOUS | Status: DC | PRN

## 2016-07-29 MED ORDER — SODIUM CHLORIDE 0.9% FLUSH
3.0000 mL | Freq: Two times a day (BID) | INTRAVENOUS | Status: DC
  Administered 2016-07-30 – 2016-08-01 (×4): 3 mL via INTRAVENOUS

## 2016-07-29 MED ORDER — HYDROCODONE-ACETAMINOPHEN 5-325 MG PO TABS
1.0000 | ORAL_TABLET | ORAL | Status: DC | PRN
  Administered 2016-07-30 (×2): 1 via ORAL
  Administered 2016-07-31 – 2016-08-02 (×4): 2 via ORAL
  Filled 2016-07-29 (×2): qty 2
  Filled 2016-07-29 (×2): qty 1
  Filled 2016-07-29 (×2): qty 2

## 2016-07-29 MED ORDER — SODIUM CHLORIDE 0.9 % IV SOLN: 250.0000 mL | INTRAVENOUS | Status: DC | PRN

## 2016-07-29 MED ORDER — SODIUM CHLORIDE 0.9% FLUSH
3.0000 mL | Freq: Two times a day (BID) | INTRAVENOUS | Status: DC
  Administered 2016-07-30 – 2016-08-01 (×6): 3 mL via INTRAVENOUS

## 2016-07-29 MED ORDER — DEXTROSE 5 % IV SOLN
1.0000 g | Freq: Once | INTRAVENOUS | Status: AC
  Administered 2016-07-29: 1 g via INTRAVENOUS
  Filled 2016-07-29: qty 10

## 2016-07-29 MED ORDER — PANTOPRAZOLE SODIUM 40 MG PO TBEC
40.0000 mg | DELAYED_RELEASE_TABLET | Freq: Every day | ORAL | Status: DC
  Administered 2016-07-30 – 2016-08-02 (×4): 40 mg via ORAL
  Filled 2016-07-29 (×4): qty 1

## 2016-07-29 MED ORDER — ADULT MULTIVITAMIN W/MINERALS CH
1.0000 | ORAL_TABLET | Freq: Two times a day (BID) | ORAL | Status: DC
  Administered 2016-07-30 – 2016-08-02 (×7): 1 via ORAL
  Filled 2016-07-29 (×11): qty 1

## 2016-07-29 MED ORDER — ONDANSETRON HCL 4 MG PO TABS: 4.0000 mg | ORAL_TABLET | Freq: Four times a day (QID) | ORAL | Status: DC | PRN

## 2016-07-29 MED ORDER — HEPARIN SODIUM (PORCINE) 5000 UNIT/ML IJ SOLN
5000.0000 [IU] | Freq: Three times a day (TID) | INTRAMUSCULAR | Status: DC
  Administered 2016-07-30 – 2016-08-01 (×8): 5000 [IU] via SUBCUTANEOUS
  Filled 2016-07-29 (×8): qty 1

## 2016-07-29 MED ORDER — DILTIAZEM HCL ER COATED BEADS 120 MG PO CP24
120.0000 mg | ORAL_CAPSULE | Freq: Two times a day (BID) | ORAL | Status: DC
  Administered 2016-07-30 – 2016-08-02 (×8): 120 mg via ORAL
  Filled 2016-07-29 (×8): qty 1

## 2016-07-29 MED ORDER — ONDANSETRON HCL 4 MG/2ML IJ SOLN
4.0000 mg | Freq: Four times a day (QID) | INTRAMUSCULAR | Status: DC | PRN
  Administered 2016-07-31: 4 mg via INTRAVENOUS
  Filled 2016-07-29: qty 2

## 2016-07-29 MED ORDER — BISACODYL 5 MG PO TBEC: 5.0000 mg | DELAYED_RELEASE_TABLET | Freq: Every day | ORAL | Status: DC | PRN

## 2016-07-29 NOTE — H&P (Addendum)
History and Physical    [name] ZOX:[id number] DOB: 19-Dec-1917 DOA: 07/29/2016  PCP: Carylon Perches, MD   Patient coming from: Home  Chief Complaint: Lower abdominal pain, foul-smelling urine, left knee pain and swelling, SOB with wheezing  HPI: [name] is a 83 y.o. female with medical history significant for coronary artery disease, chronic atrial fibrillation, chronic diastolic CHF, hypertension, history of CVA, iron deficiency anemia, and right lung nodule who presents to the emergency department for evaluation of lower abdominal pain with foul-smelling urine, pain and swelling to the left knee, and shortness of breath with wheezing. Patient is accompanied by family who assist with the history. She had reportedly been in her usual state of health until roughly a week ago when she began to complain of pain in her lower abdomen. Her family noted her urine to be quite malodorous. Over the last several days, she has also developed progressive dyspnea with wheezing. She also notes pain and swelling involving the left knee without any recent fall or trauma, and reports that she has been unable to ambulate for the past couple days secondary to this. There has not been any recent fevers or chills and the patient denies any recent chest pain or palpitations. She denies any melena or hematochezia. She denies flank pain. No diaphoresis or nausea.  ED Course: Upon arrival to the ED, patient is found to be afebrile, saturating adequately on room air, mildly tachypneic, and with vitals otherwise stable. Chest x-ray is notable for cardiomegaly with pulmonary vascular congestion and a stable right apical opacity. Chemistry panel reveals a sodium of 133, chloride 100, and serum creatinine 1.38 which appears consistent with her baseline. CBC is notable for a microcytic anemia with hemoglobin 10.3 and MCV 77.4; microcytic anemia is new relative to most recent labs in EMR, but per recent GI chart notes,  this problem has been present and apparently stable since last July. Fecal occult blood testing in the ED was negative. Troponin was elevated to 0.28 and BNP elevated to 557. Urinalysis suggests possible infection and is notable for microscopic hematuria. Urine was sent for culture and the patient was treated with 1 g of Rocephin in the emergency department. She remained hemodynamically stable, and in no acute respiratory distress, though with some audible wheezing. She will be admitted to the telemetry unit for ongoing evaluation and management of acute lower urinary tract infection and dyspnea with wheezing, and she will also be evaluated for the left knee pain and swelling.  Review of Systems:  All other systems reviewed and apart from HPI, are negative.  Past Medical History:  Diagnosis Date  . Arteriosclerotic cardiovascular disease (ASCVD)    H/o MI; cath in 2001:30-40% LAD, nl Cx, TO RCA with collaterals, EF-70% with inferior hypokinesis; repeat cath in 2002-unchanged; 2006: Neg stress nuclear  . Atrial fibrillation (HCC)    No anticoagulation  . Congestive heart failure (HCC)    06/2012 Echo: Moderate to severe LVH, nl EF, moderate LAE; mild to moderate pulmonary hypertension  . Diverticulitis    Extensive diverticulosis in 2006  . Gastroesophageal reflux disease   . Hyperlipidemia    Lipid profile 2010:112, 106, 58, 33  . Hypertension    Nl renal arteriography in 2001  . Ischemic colitis (HCC) 2006  . MI, old   . Nodule of right lung   . PONV (postoperative nausea and vomiting)   . Stroke [facility]) 2000 and   2010: Left MCA; negative carotid ultrasound; supraclinoid aneurysm bilaterally;  presumed embolic from AF  . Syncope 2003, 2014   Attributed to UTI in 2003, acute gastroenteritis in 2014    Past Surgical History:  Procedure Laterality Date  . COLONOSCOPY  2006   Dr. Karilyn Cota: ischemic colitis involving descending colon, extensive diverticula  . ESOPHAGOGASTRODUODENOSCOPY (EGD)  WITH ESOPHAGEAL DILATION N/A 06/24/2012   Procedure: ESOPHAGOGASTRODUODENOSCOPY (EGD) WITH ESOPHAGEAL DILATION;  Surgeon: Corbin Ade, MD;  Location: [facility];  Service: Endoscopy;  Laterality: N/A;  . KNEE SURGERY       reports that she has never smoked. She has never used smokeless tobacco. She reports that she does not drink alcohol or use drugs.  Allergies  Allergen Reactions  . Ciprofloxacin Rash    Family History  Problem Relation Age of Onset  . Cancer Mother   . Cancer Sister   . Heart attack Father   . Colon cancer Neg Hx      Prior to Admission medications   Medication Sig Start Date End Date Taking? Authorizing Provider  diltiazem (CARDIZEM CD) 240 MG 24 hr capsule Take 1 capsule (240 mg total) by mouth daily. Patient taking differently: Take 120 mg by mouth 2 (two) times daily.  07/08/12   Carylon Perches, MD  furosemide (LASIX) 20 MG tablet Take 20 mg by mouth every other day.  06/20/12   Historical Provider, MD  losartan (COZAAR) 100 MG tablet Take 100 mg by mouth daily.  06/20/12   Historical Provider, MD  metoprolol (LOPRESSOR) 50 MG tablet Take 1 tablet (50 mg total) by mouth 2 (two) times daily. Patient taking differently: Take 25 mg by mouth 2 (two) times daily.  07/08/12   Carylon Perches, MD  pantoprazole (PROTONIX) 40 MG tablet Take 1 tablet (40 mg total) by mouth daily. 07/08/12   Carylon Perches, MD  Pediatric Multivitamins-Iron (FLINTSTONES PLUS IRON) chewable tablet Chew 1 tablet by mouth 2 (two) times daily. 10/18/15   Malissa Hippo, MD  polyethylene glycol (MIRALAX / GLYCOLAX) packet Take 17 g by mouth daily as needed.    Historical Provider, MD  simvastatin (ZOCOR) 10 MG tablet Take 10 mg by mouth at bedtime.    Historical Provider, MD    Physical Exam: Vitals:   07/29/16 2128 07/29/16 2200 07/29/16 2230 07/29/16 2300  BP: 140/66 136/60 (!) 142/108 (!) 154/128  Pulse: 86 76 89 92  Resp: (!) 24 (!) Temp: 98.5 F (36.9 C)     SpO2: 95% 98% 97% 96%    Weight:      Height:          Constitutional: mild tachypnea, no acute distress. No pallor. No diaphoresis.  Eyes: PERTLA, lids and conjunctivae normal ENMT: Mucous membranes are moist. Posterior pharynx clear of any exudate or lesions.   Neck: normal, supple, no masses, no thyromegaly Respiratory: Slightly diminished bilaterally with expiratory wheezes. No accessory muscle use.  Cardiovascular: Rate ~80 and irregular. Trace pedal edema bilaterally. No significant JVD. Abdomen: No distension, no tenderness, no masses palpated. Bowel sounds normal.  Musculoskeletal: no clubbing / cyanosis. Left knee with mild tenderness and swelling, but no erythema or heat. No gross deformity.  Skin: no significant rashes, lesions, ulcers. Warm, dry, well-perfused. Neurologic: CN 2-12 grossly intact. Sensation intact, DTR normal. Strength 5/5 in all 4 limbs.  Psychiatric: Alert and oriented x 3. Calm and cooperative.     Labs on Admission: I have personally reviewed following labs and imaging studies  CBC:  Recent Labs Lab 07/29/16  2156  WBC 9.4  NEUTROABS 6.4  HGB 10.3*  HCT 33.5*  MCV 77.4*  PLT 211   Basic Metabolic Panel:  Recent Labs Lab 07/29/16 2156  NA 133*  K 3.6  CL 100*  CO2 27  GLUCOSE 133*  BUN 19  CREATININE 1.38*  CALCIUM 8.5*   GFR: Estimated Creatinine Clearance: 25 mL/min (A) (by C-G formula based on SCr of 1.38 mg/dL (H)). Liver Function Tests: No results for input(s): AST, ALT, ALKPHOS, BILITOT, PROT, ALBUMIN in the last 168 hours. No results for input(s): LIPASE, AMYLASE in the last 168 hours. No results for input(s): AMMONIA in the last 168 hours. Coagulation Profile: No results for input(s): INR, PROTIME in the last 168 hours. Cardiac Enzymes:  Recent Labs Lab 07/29/16 2156  TROPONINI 0.28*   BNP (last 3 results) No results for input(s): PROBNP in the last 8760 hours. HbA1C: No results for input(s): HGBA1C in the last 72 hours. CBG: No  results for input(s): GLUCAP in the last 168 hours. Lipid Profile: No results for input(s): CHOL, HDL, LDLCALC, TRIG, CHOLHDL, LDLDIRECT in the last 72 hours. Thyroid Function Tests: No results for input(s): TSH, T4TOTAL, FREET4, T3FREE, THYROIDAB in the last 72 hours. Anemia Panel: No results for input(s): VITAMINB12, FOLATE, FERRITIN, TIBC, IRON, RETICCTPCT in the last 72 hours. Urine analysis:    Component Value Date/Time   COLORURINE AMBER (A) 07/29/2016 2230   APPEARANCEUR HAZY (A) 07/29/2016 2230   LABSPEC 1.018 07/29/2016 2230   PHURINE 5.0 07/29/2016 2230   GLUCOSEU NEGATIVE 07/29/2016 2230   HGBUR MODERATE (A) 07/29/2016 2230   BILIRUBINUR NEGATIVE 07/29/2016 2230   KETONESUR NEGATIVE 07/29/2016 2230   PROTEINUR 100 (A) 07/29/2016 2230   UROBILINOGEN 1.0 08/10/2014 1441   NITRITE NEGATIVE 07/29/2016 2230   LEUKOCYTESUR TRACE (A) 07/29/2016 2230   Sepsis Labs: (procalcitonin:4,lacticidven:4) )No results found for this or any previous visit (from the past 240 hour(s)).   Radiological Exams on Admission: Dg Chest Port 1 View  Result Date: 07/29/2016 CLINICAL DATA:  Shortness of breast started 3 days ago. EXAM: PORTABLE CHEST 1 VIEW COMPARISON:  07/14/2012 FINDINGS: The cardio pericardial silhouette is enlarged. There is pulmonary vascular congestion without overt pulmonary edema. Stable opacity medial right apex. Degenerative changes left shoulder. Telemetry leads overlie the chest. IMPRESSION: 1. Cardiomegaly with vascular congestion. 2. Right apical opacity. Follow-up dedicated PA and lateral chest x-ray, after resolution of acute symptoms, recommended to further evaluate. Electronically Signed   By: Kennith Center M.D.   On: 07/29/2016 22:02    EKG: Independently reviewed. Rate-controlled a fib.   Assessment/Plan  1. UTI  - Pt presents with lower abdominal pain, malodorous urine, and UA suggestive of infection - Urine was sent for culture; prior urine culture  grew pan-sensitive klebsiella  - She was treated with empiric Rocephin in the ED; will continue pending culture data   2. Left knee pain and swelling  - Pt has hx of OA s/p TKA and reports pain and swelling for at least several days  - No redness or heat, and no leukocytosis or fever to suggest infectious etiology  - No recent trauma or fall - Likely secondary to OA   - Will check radiographs, provide symptomatic care with prn analgesia   - PT asked to eval and treat    3. SOB with bronchospasm  - Pt notes several days on SOB and family reports hearing her wheeze  - CXR features vascular congestion and a stable right apical opacity,  but no edema or PNA  - She is oxygenating and mentating appropriately on rm air  - Trial bronchodilators   4. Chronic atrial fibrillation - Pt is in rate-controlled a fib on arrival  - CHADS-VASc 7 (age x2, CVA x2, gender, CHF, HTN)  - Not anticoagulated, presumably in light of age and iron-deficiency anemia  - Continue diltiazem    5. Chronic diastolic CHF  - Pt appears roughly euvolemic on admission and wt appears to be stable compared to that at office visits  - TTE (06/14/12) with EF 65%, severe LVH, moderate LAE, mild TR, no WMA's  - She is managed with Lasix 20 mg qOD, losartan, and Lopressor at home - Continue current medications, follow daily wt and I/O's    6. Iron-deficiency anemia  - Hgb is 10.3 on admission with MCV 77.4  - She denies melena or hematochezia and FOBT is negative in ED  - She was evaluated in GI clinic for this last summer, and per CBC indices cited in that note, this appears to be stable   - Continue MV with iron, continue daily Protonix    7. CAD, elevated troponin - Pt with known CAD, neg ST in 2006 - No anginal complaints  - Troponin is elevated to 0.28 on admission with unclear etiology; low suspicion for ACS  - Plan to monitor on telemetry and trend troponin  8. GERD - EGD in 2014 with marked esophagitis; path neg  for dysplasia or malignancy  - Continue daily PPI   9. CKD stage III  - SCr is 1.38 on admission, consistent with her apparent baseline   - Avoid dehydration, renally-dose medications as needed   10. Hypertension  - Pt is hypertensive on admission  - Managed at home with diltiazem, Lopressor, and losartan  - Will give evening dose of antihypertensives and treat knee pain; if pressures remain elevated, hydralazine IVP's available prn    DVT prophylaxis: sq heparin  Code Status: DNR Family Communication: Family updated at bedside Disposition Plan: Admit to telemetry Consults called: None Admission status: Inpatient    Briscoe Deutscher, MD [facility] Pager [phone]  If 7PM-7AM, please contact night-coverage [email or web address] Password [facility]  07/29/2016, 11:38 PM

## 2016-07-29 NOTE — ED Notes (Signed)
CRITICAL VALUE ALERT  Critical value received:  Troponin 0.28  Date of notification:  07/29/16  Time of notification:  2300  Critical value read back:Yes.    Nurse who received alert:  Rudene Anda, RN  Responding MD:   Dr. Clarene Duke  Time MD responded:  2300

## 2016-07-29 NOTE — ED Notes (Signed)
Admitting Provider at bedside. 

## 2016-07-29 NOTE — ED Provider Notes (Signed)
[facility]-EM[facility]Y DEPT Provider Note   CSN: [id number] Arrival date & time: 07/29/16  2123     History   Chief Complaint Chief Complaint  Patient presents with  . Shortness of Breath    HPI DOYCE STONEHOUSE is a 83 y.o. female.   Shortness of Breath    Pt was seen at 2135. Per pt, c/o gradual onset and worsening of persistent SOB for the past 1 week. Has been associated with increasing pedal edema bilaterally. Denies CP/palpitations, no abd pain, no N/V/D, no back pain.   Past Medical History:  Diagnosis Date  . Arteriosclerotic cardiovascular disease (ASCVD)    H/o MI; cath in 2001:30-40% LAD, nl Cx, TO RCA with collaterals, EF-70% with inferior hypokinesis; repeat cath in 2002-unchanged; 2006: Neg stress nuclear  . Atrial fibrillation (HCC)    No anticoagulation  . Congestive heart failure (HCC)    06/2012 Echo: Moderate to severe LVH, nl EF, moderate LAE; mild to moderate pulmonary hypertension  . Diverticulitis    Extensive diverticulosis in 2006  . Gastroesophageal reflux disease   . Hyperlipidemia    Lipid profile 2010:112, 106, 58, 33  . Hypertension    Nl renal arteriography in 2001  . Ischemic colitis (HCC) 2006  . MI, old   . Nodule of right lung   . PONV (postoperative nausea and vomiting)   . 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 Syncope 2003, 2014   Attributed to UTI in 2003, acute gastroenteritis in 2014    Patient Active Problem List   Diagnosis Date Noted  . UTI (urinary tract infection) 08/10/2014  . Left hip pain 08/10/2014  . UTI (lower urinary tract infection) 08/10/2014  . Hypertension   . Stroke (HCC)   . Congestive heart failure (HCC)   . Gastroesophageal reflux disease   . Hyperlipidemia   . Arteriosclerotic cardiovascular disease (ASCVD)   . Atrial fibrillation (HCC) 07/04/2012  . Bradycardia 06/23/2012    Past Surgical History:    Procedure Laterality Date  . COLONOSCOPY  2006   Dr. Karilyn Cota: ischemic colitis involving descending colon, extensive diverticula  . ESOPHAGOGASTRODUODENOSCOPY (EGD) WITH ESOPHAGEAL DILATION N/A 06/24/2012   Procedure: ESOPHAGOGASTRODUODENOSCOPY (EGD) WITH ESOPHAGEAL DILATION;  Surgeon: Corbin Ade, MD;  Location: [facility];  Service: Endoscopy;  Laterality: N/A;  . KNEE SURGERY      OB History    No data available       Home Medications    Prior to Admission medications   Medication Sig Start Date End Date Taking? Authorizing Provider  diltiazem (CARDIZEM CD) 240 MG 24 hr capsule Take 1 capsule (240 mg total) by mouth daily. Patient taking differently: Take 120 mg by mouth 2 (two) times daily.  07/08/12   Carylon Perches, MD  furosemide (LASIX) 20 MG tablet Take 20 mg by mouth every other day.  06/20/12   Historical Provider, MD  losartan (COZAAR) 100 MG tablet Take 100 mg by mouth daily.  06/20/12   Historical Provider, MD  metoprolol (LOPRESSOR) 50 MG tablet Take 1 tablet (50 mg total) by mouth 2 (two) times daily. Patient taking differently: Take 25 mg by mouth 2 (two) times daily.  07/08/12   Carylon Perches, MD  pantoprazole (PROTONIX) 40 MG tablet Take 1 tablet (40 mg total) by mouth daily. 07/08/12   Carylon Perches, MD  Pediatric Multivitamins-Iron (FLINTSTONES PLUS IRON) chewable tablet Chew 1 tablet by mouth 2 (  two) times daily. 10/18/15   Malissa Hippo, MD  polyethylene glycol (MIRALAX / GLYCOLAX) packet Take 17 g by mouth daily as needed.    Historical Provider, MD  simvastatin (ZOCOR) 10 MG tablet Take 10 mg by mouth at bedtime.    Historical Provider, MD    Family History Family History  Problem Relation Age of Onset  . Cancer Mother   . Cancer Sister   . Heart attack Father   . Colon cancer Neg Hx     Social History Social History  Substance Use Topics  . Smoking status: Never Smoker  . Smokeless tobacco: Never Used  . Alcohol use No     Allergies    Ciprofloxacin   Review of Systems Review of Systems  Respiratory: Positive for shortness of breath.   ROS: Statement: All systems negative except as marked or noted in the HPI; Constitutional: Negative for fever and chills. ; ; Eyes: Negative for eye pain, redness and discharge. ; ; ENMT: Negative for ear pain, hoarseness, nasal congestion, sinus pressure and sore throat. ; ; Cardiovascular: Negative for chest pain, palpitations, diaphoresis. +dyspnea and peripheral edema. ; ; Respiratory: Negative for cough, wheezing and stridor. ; ; Gastrointestinal: Negative for nausea, vomiting, diarrhea, abdominal pain, blood in stool, hematemesis, jaundice and rectal bleeding. . ; ; Genitourinary: Negative for dysuria, flank pain and hematuria. ; ; Musculoskeletal: Negative for back pain and neck pain. Negative for swelling and trauma.; ; Skin: Negative for pruritus, rash, abrasions, blisters, bruising and skin lesion.; ; Neuro: Negative for headache, lightheadedness and neck stiffness. Negative for weakness, altered level of consciousness, altered mental status, extremity weakness, paresthesias, involuntary movement, seizure and syncope.       Physical Exam Updated Vital Signs BP 140/66   Pulse 86   Temp 98.5 F (36.9 C)   Resp (!) 24   Ht  (1.676 m)   Wt 170 lb (77.1 kg)   SpO2 95%   BMI 27.44 kg/m   Physical Exam 2140: Physical examination:  Nursing notes reviewed; Vital signs and O2 SAT reviewed;  Constitutional: Well developed, Well nourished, Well hydrated, In no acute distress; Head:  Normocephalic, atraumatic; Eyes: EOMI, PERRL, No scleral icterus; ENMT: Mouth and pharynx normal, Mucous membranes moist; Neck: Supple, Full range of motion, No lymphadenopathy; Cardiovascular: Irregular rate and rhythm, No gallop; Respiratory: Breath sounds coarse & equal bilaterally, No wheezes.  Speaking full sentences with ease, Normal respiratory effort/excursion; Chest: Nontender, Movement normal;  Abdomen: Soft, Nontender, Nondistended, Normal bowel sounds. Rectal exam performed w/permission of pt and ED RN chaperone present.  Anal tone normal.  Non-tender, soft brown stool in rectal vault, heme neg.  No fissures, no external hemorrhoids, no palp masses.; Genitourinary: No CVA tenderness; Extremities: Pulses normal, No tenderness, +1 pedal edema bilat. No calf asymmetry.; Neuro: AA&Ox3, vague historian. Major CN grossly intact.  Speech clear. No gross focal motor or sensory deficits in extremities.; Skin: Color normal, Warm, Dry.   ED Treatments / Results  Labs (all labs ordered are listed, but only abnormal results are displayed)   EKG  EKG Interpretation None       Radiology  Procedures Procedures (including critical care time)  Medications Ordered in ED Medications - No data to display   Initial Impression / Assessment and Plan / ED Course  I have reviewed the triage vital signs and the nursing notes.  Pertinent labs & imaging results that were available during my care of the patient were reviewed by  me and considered in my medical decision making (see chart for details).  MDM Reviewed: previous chart, nursing note and vitals Reviewed previous: labs and ECG Interpretation: labs, ECG and x-ray   ED ECG REPORT   Date: 07/29/2016  Rate: 79  Rhythm: atrial fibrillation  QRS Axis: left  Intervals: normal  ST/T Wave abnormalities: nonspecific ST/T changes  Conduction Disutrbances:none  Narrative Interpretation:   Old EKG Reviewed: unchanged; no significant changes from previous EKG dated 07/14/2012.   Results for orders placed or performed during the hospital encounter of 07/29/16  Urinalysis, Routine w reflex microscopic  Result Value Ref Range   Color, Urine AMBER (A) YELLOW   APPearance HAZY (A) CLEAR   Specific Gravity, Urine 1.018 1.005 - 1.030   pH 5.0 5.0 - 8.0   Glucose, UA NEGATIVE NEGATIVE mg/dL   Hgb urine dipstick MODERATE (A) NEGATIVE    Bilirubin Urine NEGATIVE NEGATIVE   Ketones, ur NEGATIVE NEGATIVE mg/dL   Protein, ur 409 (A) NEGATIVE mg/dL   Nitrite NEGATIVE NEGATIVE   Leukocytes, UA TRACE (A) NEGATIVE   RBC / HPF 0-5 0 - 5 RBC/hpf   WBC, UA 6-30 0 - 5 WBC/hpf   Bacteria, UA RARE (A) NONE SEEN   Squamous Epithelial / LPF 0-5 (A) NONE SEEN   Mucous PRESENT   Basic metabolic panel  Result Value Ref Range   Sodium 133 (L) 135 - 145 mmol/L   Potassium 3.6 3.5 - 5.1 mmol/L   Chloride 100 (L) 101 - 111 mmol/L   CO2 27 22 - 32 mmol/L   Glucose, Bld 133 (H) 65 - 99 mg/dL   BUN 19 6 - 20 mg/dL   Creatinine, Ser 8.11 (H) 0.44 - 1.00 mg/dL   Calcium 8.5 (L) 8.9 - 10.3 mg/dL   GFR calc non Af Amer 31 (L) >60 mL/min   GFR calc Af Amer 36 (L) >60 mL/min   Anion gap 6 5 - 15  Brain natriuretic peptide  Result Value Ref Range   B Natriuretic Peptide 557.0 (H) 0.0 - 100.0 pg/mL  Troponin I  Result Value Ref Range   Troponin I 0.28 (HH) <0.03 ng/mL  CBC with Differential  Result Value Ref Range   WBC 9.4 4.0 - 10.5 K/uL   RBC 4.33 3.87 - 5.11 MIL/uL   Hemoglobin 10.3 (L) 12.0 - 15.0 g/dL   HCT 91.4 (L) 78.2 - 95.6 %   MCV 77.4 (L) 78.0 - 100.0 fL   MCH 23.8 (L) 26.0 - 34.0 pg   MCHC 30.7 30.0 - 36.0 g/dL   RDW 21.3 (H) 08.6 - 57.8 %   Platelets 211 150 - 400 K/uL   Neutrophils Relative % 69 %   Neutro Abs 6.4 1.7 - 7.7 K/uL   Lymphocytes Relative 10 %   Lymphs Abs 0.9 0.7 - 4.0 K/uL   Monocytes Relative 21 %   Monocytes Absolute 2.0 (H) 0.1 - 1.0 K/uL   Eosinophils Relative 0 %   Eosinophils Absolute 0.0 0.0 - 0.7 K/uL   Basophils Relative 0 %   Basophils Absolute 0.0 0.0 - 0.1 K/uL   Dg Chest Port 1 View Result Date: 07/29/2016 CLINICAL DATA:  Shortness of breast started 3 days ago. EXAM: PORTABLE CHEST 1 VIEW COMPARISON:  07/14/2012 FINDINGS: The cardio pericardial silhouette is enlarged. There is pulmonary vascular congestion without overt pulmonary edema. Stable opacity medial right apex. Degenerative  changes left shoulder. Telemetry leads overlie the chest. IMPRESSION: 1. Cardiomegaly with vascular congestion. 2.  Right apical opacity. Follow-up dedicated PA and lateral chest x-ray, after resolution of acute symptoms, recommended to further evaluate. Electronically Signed   By: Kennith Center M.D.   On: 07/29/2016 22:02    Results for BELISA, EICHHOLZ (MRN [id number]) as of 07/29/2016 23:05  Ref. Range 08/11/2014 06:05 08/30/2015 13:25 11/09/2015 07:41 12/16/2015 07:16 07/29/2016 21:56  Hemoglobin Latest Ref Range: 12.0 - 15.0 g/dL 78.4 (L) 8.1 (L) 69.6 29.5 10.3 (L)  HCT Latest Ref Range: 36.0 - 46.0 % 37.8 28.0 (L) 42.0 44.1 33.5 (L)   Results for COURTNIE, BRENES (MRN [id number]) as of 07/29/2016 23:05  Ref. Range 07/07/2012 05:48 07/23/2012 07:50 08/10/2014 12:16 08/11/2014 06:05 07/29/2016 21:56  BUN Latest Ref Range: 6 - 20 mg/dL 24 (H) 17 23 (H) 19 19  Creatinine Latest Ref Range: 0.44 - 1.00 mg/dL 1.02 (H) 7.25 (H) 3.66 (H) 1.03 (H) 1.38 (H)    2310:  Urine is foul smelling; will tx for UTI with IV rocephin while UC pending. H/H lower than previous; stool heme negative. BUN/Cr near baseline. BNP elevated, but no acute CHF on CXR. Troponin elevated; pt denies CP.  Dx and testing d/w pt and family.  Questions answered.  Verb understanding, agreeable to admit. T/C to [facility] Dr. Antionette Char, case discussed, including:  HPI, pertinent PM/SHx, VS/PE, dx testing, ED course and treatment:  Agreeable to admit.    Final Clinical Impressions(s) / ED Diagnoses   Final diagnoses:  None    New Prescriptions New Prescriptions   No medications on file      Samuel Jester, DO 08/01/16 1459

## 2016-07-29 NOTE — ED Notes (Signed)
Patient had adult brief on that was heavily saturated,the lining of the brief was decentergrading from being so heavily saturated with urine. Beside cleaning done.

## 2016-07-29 NOTE — ED Notes (Signed)
Occult blood negative per MD

## 2016-07-29 NOTE — ED Triage Notes (Signed)
Pt c/o sob that started three days ago with bilateral lower leg swelling,

## 2016-07-30 ENCOUNTER — Encounter (HOSPITAL_COMMUNITY): Payer: Self-pay | Admitting: *Deleted

## 2016-07-30 ENCOUNTER — Inpatient Hospital Stay (HOSPITAL_COMMUNITY): Payer: Medicare Other

## 2016-07-30 DIAGNOSIS — R7989 Other specified abnormal findings of blood chemistry: Secondary | ICD-10-CM | POA: Insufficient documentation

## 2016-07-30 DIAGNOSIS — R778 Other specified abnormalities of plasma proteins: Secondary | ICD-10-CM | POA: Insufficient documentation

## 2016-07-30 LAB — RETICULOCYTES
RBC.: 4.49 MIL/uL (ref 3.87–5.11)
RETIC COUNT ABSOLUTE: 58.4 10*3/uL (ref 19.0–186.0)
RETIC CT PCT: 1.3 % (ref 0.4–3.1)

## 2016-07-30 LAB — GRAM STAIN

## 2016-07-30 LAB — SYNOVIAL CELL COUNT + DIFF, W/ CRYSTALS
Crystals, Fluid: NONE SEEN
EOSINOPHILS-SYNOVIAL: 0 % (ref 0–1)
Lymphocytes-Synovial Fld: 0 % (ref 0–20)
MONOCYTE-MACROPHAGE-SYNOVIAL FLUID: 12 % — AB (ref 50–90)
Neutrophil, Synovial: 88 % — ABNORMAL HIGH (ref 0–25)
WBC, SYNOVIAL: 9405 /mm3 — AB (ref 0–200)

## 2016-07-30 LAB — TROPONIN I
TROPONIN I: 0.21 ng/mL — AB (ref <0.03)
TROPONIN I: 0.13 ng/mL — AB (ref <0.03)
Troponin I: 0.16 ng/mL (ref <0.03)

## 2016-07-30 LAB — VITAMIN B12: Vitamin B-12: 360 pg/mL (ref 180–914)

## 2016-07-30 LAB — BASIC METABOLIC PANEL
ANION GAP: 10 (ref 5–15)
BUN: 20 mg/dL (ref 6–20)
CALCIUM: 8.1 mg/dL — AB (ref 8.9–10.3)
CO2: 24 mmol/L (ref 22–32)
Chloride: 100 mmol/L — ABNORMAL LOW (ref 101–111)
Creatinine, Ser: 1.24 mg/dL — ABNORMAL HIGH (ref 0.44–1.00)
GFR, EST AFRICAN AMERICAN: 41 mL/min — AB (ref >60)
GFR, EST NON AFRICAN AMERICAN: 35 mL/min — AB (ref >60)
Glucose, Bld: 122 mg/dL — ABNORMAL HIGH (ref 65–99)
Potassium: 3.4 mmol/L — ABNORMAL LOW (ref 3.5–5.1)
SODIUM: 134 mmol/L — AB (ref 135–145)

## 2016-07-30 LAB — IRON AND TIBC
Iron: 13 ug/dL — ABNORMAL LOW (ref 28–170)
Saturation Ratios: 4 % — ABNORMAL LOW (ref 10.4–31.8)
TIBC: 321 ug/dL (ref 250–450)
UIBC: 308 ug/dL

## 2016-07-30 LAB — FOLATE: Folate: 16.3 ng/mL (ref >5.9)

## 2016-07-30 LAB — GLUCOSE, CAPILLARY: GLUCOSE-CAPILLARY: 113 mg/dL — AB (ref 65–99)

## 2016-07-30 LAB — FERRITIN: FERRITIN: 42 ng/mL (ref 11–307)

## 2016-07-30 MED ORDER — ZOLPIDEM TARTRATE 5 MG PO TABS
5.0000 mg | ORAL_TABLET | Freq: Every evening | ORAL | Status: DC | PRN
  Administered 2016-07-30: 5 mg via ORAL
  Filled 2016-07-30: qty 1

## 2016-07-30 MED ORDER — POTASSIUM CHLORIDE CRYS ER 20 MEQ PO TBCR
20.0000 meq | EXTENDED_RELEASE_TABLET | Freq: Three times a day (TID) | ORAL | Status: AC
  Administered 2016-07-30 – 2016-07-31 (×6): 20 meq via ORAL
  Filled 2016-07-30 (×6): qty 1

## 2016-07-30 MED ORDER — HYDRALAZINE HCL 20 MG/ML IJ SOLN: 5.0000 mg | INTRAMUSCULAR | Status: DC | PRN

## 2016-07-30 MED ORDER — FUROSEMIDE 20 MG PO TABS
20.0000 mg | ORAL_TABLET | Freq: Every day | ORAL | Status: DC
  Administered 2016-07-30 – 2016-07-31 (×2): 20 mg via ORAL
  Filled 2016-07-30 (×2): qty 1

## 2016-07-30 NOTE — Progress Notes (Signed)
Patient ID: Manson Allan, female   DOB: 06/24/1917, 83 y.o.   MRN: [id number] Orthopaedic service  Left knee effusion  UTI CHF  BP 133/75 (BP Location: Right Arm)   Pulse (!) 110   Temp 97.6 F (36.4 C) (Axillary)   Resp 18   Ht  (1.753 m)   Wt 182 lb 12.8 oz (82.9 kg)   SpO2 96%   BMI 26.99 kg/m   CBC Latest Ref Rng & Units 07/29/2016 12/16/2015 11/09/2015  WBC 4.0 - 10.5 K/uL 9.4 9.1 7.5  Hemoglobin 12.0 - 15.0 g/dL 10.3(L) 14.0 13.0  Hematocrit 36.0 - 46.0 % 33.5(L) 44.1 42.0  Platelets 150 - 400 K/uL 211 208 195   BMP Latest Ref Rng & Units 07/30/2016 07/29/2016 08/11/2014  Glucose 65 - 99 mg/dL 782(N) 562(Z) 308(M)  BUN 6 - 20 mg/dL Creatinine 0.44 - 1.00 mg/dL 5.78(I) 6.96(E) 9.52(W)  Sodium 135 - 145 mmol/L 134(L) 133(L) 138  Potassium 3.5 - 5.1 mmol/L 3.4(L) 3.6 3.6  Chloride 101 - 111 mmol/L 100(L) 100(L) 101  CO2 22 - 32 mmol/L Calcium 8.9 - 10.3 mg/dL 8.1(L) 8.5(L) 8.5(L)    LEFT KNEE SWOLLEN , THE PATIENT CAN T WEIGHT BEAR  XRAYS SHOW SEVERE OA   THE DAUGHTER SAYS IT GONE DOWN SOMEWHAT  THE KNEE NEEDS ASPIRATION

## 2016-07-30 NOTE — Consult Note (Addendum)
Reason for Consult: left knee effusion  Referring Physician: Dr Pia Mau is an 83 y.o. female.  HPI: This 83 year old female who had a right total knee many years ago could not have the left knee done because of cardiac disease. She was admitted to the hospital yesterday after inability to walk and weight-bear and exacerbation of shortness of breath and congestive heart failure.  She complains of pain and swelling decreased range of motion and loss weightbearing ability of the left knee  I was asked to consult for evaluation of the knee effusion  Past Medical History:  Diagnosis Date  . Arteriosclerotic cardiovascular disease (ASCVD)    H/o MI; cath in 2001:30-40% LAD, nl Cx, TO RCA with collaterals, EF-70% with inferior hypokinesis; repeat cath in 2002-unchanged; 2006: Neg stress nuclear  . Atrial fibrillation (HCC)    No anticoagulation  . Congestive heart failure ([place])    06/2012 Echo: Moderate to severe LVH, nl EF, moderate LAE; mild to moderate pulmonary hypertension  . Diverticulitis    Extensive diverticulosis in 2006  . Gastroesophageal reflux disease   . Hyperlipidemia    Lipid profile 2010:112, 106, 58, 33  . Hypertension    Nl renal arteriography in 2001  . Ischemic colitis ([place]) 2006  . MI, old   . Nodule of right lung   . PONV (postoperative nausea and vomiting)   . Stroke [facility]) 2000 and   2010: Left MCA; negative carotid ultrasound; supraclinoid aneurysm bilaterally; presumed embolic from AF  . Syncope 2003, 2014   Attributed to UTI in 2003, acute gastroenteritis in 2014    Past Surgical History:  Procedure Laterality Date  . COLONOSCOPY  2006   Dr. Laural Golden: ischemic colitis involving descending colon, extensive diverticula  . ESOPHAGOGASTRODUODENOSCOPY (EGD) WITH ESOPHAGEAL DILATION N/A 06/24/2012   Procedure: ESOPHAGOGASTRODUODENOSCOPY (EGD) WITH ESOPHAGEAL DILATION;  Surgeon: Daneil Dolin, MD;  Location: [facility];  Service: Endoscopy;   Laterality: N/A;  . KNEE SURGERY      Family History  Problem Relation Age of Onset  . Cancer Mother   . Cancer Sister   . Heart attack Father   . Colon cancer Neg Hx     Social History:  reports that she has never smoked. She has never used smokeless tobacco. She reports that she does not drink alcohol or use drugs.  Allergies:  Allergies  Allergen Reactions  . Ciprofloxacin Rash    Medications: I have reviewed the patient's current medications.   Current Facility-Administered Medications:  .  0.9 %  sodium chloride infusion, 250 mL, Intravenous, PRN, Ilene Qua Opyd, MD .  acetaminophen (TYLENOL) tablet 650 mg, 650 mg, Oral, Q6H PRN **OR** acetaminophen (TYLENOL) suppository 650 mg, 650 mg, Rectal, Q6H PRN, Ilene Qua Opyd, MD .  albuterol (PROVENTIL) (2.5 MG/3ML) 0.083% nebulizer solution 2.5 mg, 2.5 mg, Nebulization, Q6H PRN, Ilene Qua Opyd, MD .  bisacodyl (DULCOLAX) EC tablet 5 mg, 5 mg, Oral, Daily PRN, Ilene Qua Opyd, MD .  cefTRIAXone (ROCEPHIN) 1 g in dextrose 5 % 50 mL IVPB, 1 g, Intravenous, Q24H, Timothy S Opyd, MD .  diltiazem (CARDIZEM CD) 24 hr capsule 120 mg, 120 mg, Oral, BID, Ilene Qua Opyd, MD, 120 mg at 07/30/16 0943 .  furosemide (LASIX) tablet 20 mg, 20 mg, Oral, Daily, [name], MD, 20 mg at 07/30/16 0943 .  heparin injection 5,000 Units, 5,000 Units, Subcutaneous, Q8H, Vianne Bulls, MD, 5,000 Units at 07/30/16 0943 .  hydrALAZINE (  APRESOLINE) injection 5-10 mg, 5-10 mg, Intravenous, Q4H PRN, Ilene Qua Opyd, MD .  HYDROcodone-acetaminophen (NORCO/VICODIN) 5-325 MG per tablet 1-2 tablet, 1-2 tablet, Oral, Q4H PRN, Vianne Bulls, MD, 1 tablet at 07/30/16 0242 .  losartan (COZAAR) tablet 100 mg, 100 mg, Oral, Daily, Ilene Qua Opyd, MD, 100 mg at 07/30/16 0943 .  metoprolol tartrate (LOPRESSOR) tablet 25 mg, 25 mg, Oral, BID, Ilene Qua Opyd, MD, 25 mg at 07/30/16 0943 .  multivitamin with minerals tablet 1 tablet, 1 tablet, Oral, BID, Vianne Bulls, MD, 1  tablet at 07/30/16 0942 .  ondansetron (ZOFRAN) tablet 4 mg, 4 mg, Oral, Q6H PRN **OR** ondansetron (ZOFRAN) injection 4 mg, 4 mg, Intravenous, Q6H PRN, Ilene Qua Opyd, MD .  pantoprazole (PROTONIX) EC tablet 40 mg, 40 mg, Oral, Daily, Vianne Bulls, MD, 40 mg at 07/30/16 0943 .  polyethylene glycol (MIRALAX / GLYCOLAX) packet 17 g, 17 g, Oral, Daily PRN, Ilene Qua Opyd, MD .  potassium chloride SA (K-DUR,KLOR-CON) CR tablet 20 mEq, 20 mEq, Oral, TID, [name], MD, 20 mEq at 07/30/16 0943 .  simvastatin (ZOCOR) tablet 10 mg, 10 mg, Oral, QHS, Timothy S Opyd, MD .  sodium chloride flush (NS) 0.9 % injection 3 mL, 3 mL, Intravenous, Q12H, Ilene Qua Opyd, MD, 3 mL at 07/30/16 1000 .  sodium chloride flush (NS) 0.9 % injection 3 mL, 3 mL, Intravenous, Q12H, Timothy S Opyd, MD .  sodium chloride flush (NS) 0.9 % injection 3 mL, 3 mL, Intravenous, PRN, Vianne Bulls, MD   Results for orders placed or performed during the hospital encounter of 07/29/16 (from the past 48 hour(s))  Basic metabolic panel     Status: Abnormal   Collection Time: 07/29/16  9:56 PM  Result Value Ref Range   Sodium 133 (L) 135 - 145 mmol/L   Potassium 3.6 3.5 - 5.1 mmol/L   Chloride 100 (L) 101 - 111 mmol/L   CO2 27 22 - 32 mmol/L   Glucose, Bld 133 (H) 65 - 99 mg/dL   BUN 19 6 - 20 mg/dL   Creatinine, Ser 1.38 (H) 0.44 - 1.00 mg/dL   Calcium 8.5 (L) 8.9 - 10.3 mg/dL   GFR calc non Af Amer 31 (L) >60 mL/min   GFR calc Af Amer 36 (L) >60 mL/min    Comment: (NOTE) The eGFR has been calculated using the CKD EPI equation. This calculation has not been validated in all clinical situations. eGFR's persistently <60 mL/min signify possible Chronic Kidney Disease.    Anion gap 6 5 - 15  Brain natriuretic peptide     Status: Abnormal   Collection Time: 07/29/16  9:56 PM  Result Value Ref Range   B Natriuretic Peptide 557.0 (H) 0.0 - 100.0 pg/mL  Troponin I     Status: Abnormal   Collection Time: 07/29/16  9:56 PM   Result Value Ref Range   Troponin I 0.28 (HH) <0.03 ng/mL    Comment: CRITICAL RESULT CALLED TO, READ BACK BY AND VERIFIED WITH:  YARBOUGH,S @ 8676 ON 07/29/16 BY JUW   CBC with Differential     Status: Abnormal   Collection Time: 07/29/16  9:56 PM  Result Value Ref Range   WBC 9.4 4.0 - 10.5 K/uL   RBC 4.33 3.87 - 5.11 MIL/uL   Hemoglobin 10.3 (L) 12.0 - 15.0 g/dL   HCT 33.5 (L) 36.0 - 46.0 %   MCV 77.4 (L) 78.0 - 100.0 fL   MCH  23.8 (L) 26.0 - 34.0 pg   MCHC 30.7 30.0 - 36.0 g/dL   RDW 16.4 (H) 11.5 - 15.5 %   Platelets 211 150 - 400 K/uL   Neutrophils Relative % 69 %   Neutro Abs 6.4 1.7 - 7.7 K/uL   Lymphocytes Relative 10 %   Lymphs Abs 0.9 0.7 - 4.0 K/uL   Monocytes Relative 21 %   Monocytes Absolute 2.0 (H) 0.1 - 1.0 K/uL   Eosinophils Relative 0 %   Eosinophils Absolute 0.0 0.0 - 0.7 K/uL   Basophils Relative 0 %   Basophils Absolute 0.0 0.0 - 0.1 K/uL  Urinalysis, Routine w reflex microscopic     Status: Abnormal   Collection Time: 07/29/16 10:30 PM  Result Value Ref Range   Color, Urine AMBER (A) YELLOW    Comment: BIOCHEMICALS MAY BE AFFECTED BY COLOR   APPearance HAZY (A) CLEAR   Specific Gravity, Urine 1.018 1.005 - 1.030   pH 5.0 5.0 - 8.0   Glucose, UA NEGATIVE NEGATIVE mg/dL   Hgb urine dipstick MODERATE (A) NEGATIVE   Bilirubin Urine NEGATIVE NEGATIVE   Ketones, ur NEGATIVE NEGATIVE mg/dL   Protein, ur 100 (A) NEGATIVE mg/dL   Nitrite NEGATIVE NEGATIVE   Leukocytes, UA TRACE (A) NEGATIVE   RBC / HPF 0-5 0 - 5 RBC/hpf   WBC, UA 6-30 0 - 5 WBC/hpf   Bacteria, UA RARE (A) NONE SEEN   Squamous Epithelial / LPF 0-5 (A) NONE SEEN   Mucous PRESENT   Troponin I (q 6hr x 3)     Status: Abnormal   Collection Time: 07/30/16  2:57 AM  Result Value Ref Range   Troponin I 0.21 (HH) <0.03 ng/mL    Comment: CRITICAL VALUE NOTED.  VALUE IS CONSISTENT WITH PREVIOUSLY REPORTED AND CALLED VALUE.  Basic metabolic panel     Status: Abnormal   Collection Time: 07/30/16   4:37 AM  Result Value Ref Range   Sodium 134 (L) 135 - 145 mmol/L   Potassium 3.4 (L) 3.5 - 5.1 mmol/L   Chloride 100 (L) 101 - 111 mmol/L   CO2 24 22 - 32 mmol/L   Glucose, Bld 122 (H) 65 - 99 mg/dL   BUN 20 6 - 20 mg/dL   Creatinine, Ser 1.24 (H) 0.44 - 1.00 mg/dL   Calcium 8.1 (L) 8.9 - 10.3 mg/dL   GFR calc non Af Amer 35 (L) >60 mL/min   GFR calc Af Amer 41 (L) >60 mL/min    Comment: (NOTE) The eGFR has been calculated using the CKD EPI equation. This calculation has not been validated in all clinical situations. eGFR's persistently <60 mL/min signify possible Chronic Kidney Disease.    Anion gap 10 5 - 15  Glucose, capillary     Status: Abnormal   Collection Time: 07/30/16  7:41 AM  Result Value Ref Range   Glucose-Capillary 113 (H) 65 - 99 mg/dL  Troponin I (q 6hr x 3)     Status: Abnormal   Collection Time: 07/30/16  8:32 AM  Result Value Ref Range   Troponin I 0.16 (HH) <0.03 ng/mL    Comment: CRITICAL VALUE NOTED.  VALUE IS CONSISTENT WITH PREVIOUSLY REPORTED AND CALLED VALUE.  Reticulocytes     Status: None   Collection Time: 07/30/16  8:32 AM  Result Value Ref Range   Retic Ct Pct 1.3 0.4 - 3.1 %   RBC. 4.49 3.87 - 5.11 MIL/uL   Retic Count, Manual 58.4 19.0 -  186.0 K/uL    Dg Knee 1-2 Views Left  Result Date: 07/30/2016 CLINICAL DATA:  83 year old female with bilateral lower extremity swelling and left knee pain. EXAM: LEFT KNEE - 1-2 VIEW COMPARISON:  Left knee radiograph dated 08/12/2014 FINDINGS: There is no acute fracture or dislocation. The bones are osteopenic. There is severe osteoarthritic changes of the knee with tricompartmental narrowing most severely involving the lateral compartment. There is a moderate size suprapatellar effusion. There is vascular atherosclerotic disease. IMPRESSION: 1. No acute fracture or dislocation. 2. Severe osteoarthritis. 3. Moderate size suprapatellar effusion. Electronically Signed   By: Anner Crete M.D.   On:  07/30/2016 01:04   Dg Chest Port 1 View  Result Date: 07/29/2016 CLINICAL DATA:  Shortness of breast started 3 days ago. EXAM: PORTABLE CHEST 1 VIEW COMPARISON:  07/14/2012 FINDINGS: The cardio pericardial silhouette is enlarged. There is pulmonary vascular congestion without overt pulmonary edema. Stable opacity medial right apex. Degenerative changes left shoulder. Telemetry leads overlie the chest. IMPRESSION: 1. Cardiomegaly with vascular congestion. 2. Right apical opacity. Follow-up dedicated PA and lateral chest x-ray, after resolution of acute symptoms, recommended to further evaluate. Electronically Signed   By: Misty Jamicah Anstead M.D.   On: 07/29/2016 22:02    Review of Systems  Constitutional: Negative for fever.  Respiratory: Positive for shortness of breath and wheezing.   Cardiovascular: Negative for chest pain.  Musculoskeletal: Positive for joint pain.  Neurological: Positive for weakness.   Blood pressure 133/75, pulse (!) 110, temperature 97.6 F (36.4 C), temperature source Axillary, resp. rate 18, height 5' 9"  (1.753 m), weight 182 lb 12.8 oz (82.9 kg), SpO2 96 %. Physical Exam  Constitutional: She appears well-developed and well-nourished. She appears distressed.  Musculoskeletal:  Right knee well-healed incision slight flexion contracture no effusion knee stable to anterior and posterior drawer stress test muscle tone normal no tenderness.  Left knee transverse incision may have been from an injury. Flexion contracture 20. Painful passive range of motion. The knee was slightly warm to touch with no erythema over the skin, muscle tone was normal was tender to palpation large suprapatellar effusion stable to anterior and posterior stress testing with pain inhibiting true exam  Neurological:  She was arousable and appropriate in conversation is oriented to person and place but not time  Skin: Skin is warm and dry. No rash noted. She is not diaphoretic. No erythema.   Psychiatric: She has a normal mood and affect. Her behavior is normal. Thought content normal.   There is mild to moderate peripheral edema in both lower extremities  Her right left upper extremities were grossly normal in terms of alignment. Muscle tone was normal.   Assessment/Plan: Left knee effusion Left knee arthritis Gait abnormality Left knee pain  Procedure note injection and aspiration left knee joint  Verbal consent was obtained to aspirate  the left knee joint   Timeout was completed to confirm the site of aspiration   An 18-gauge needle was used to aspirate the left knee joint from a suprapatellar lateral approach.  Anesthesia was provided by ethyl chloride and the skin was prepped with alcohol.  After cleaning the skin with alcohol an 18-gauge needle was used to aspirate the right knee joint.  We obtained 80 cc of fluid. The fluid was cloudy  There were no complications. A sterile bandage was applied.  The fluid was sent for analysis, crystals and culture   Arther Abbott 07/30/2016, 12:34 PM

## 2016-07-30 NOTE — Progress Notes (Signed)
Subjective: She complains of left knee pain. She denies dysuria. She denies shortness of breath. She has been confused.  Objective: Vital signs in last 24 hours: Vitals:   07/29/16 2330 07/30/16 0000 07/30/16 0033 07/30/16 0450  BP: (!) 153/116 137/80 (!) 161/81 133/75  Pulse: 92 72 96 (!) 110  Resp: (!) 22 (!) Temp:   99 F (37.2 C) 97.6 F (36.4 C)  TempSrc:   Oral Axillary  SpO2: 98% 97% 98% 96%  Weight:   183 lb 4.8 oz (83.1 kg) 182 lb 12.8 oz (82.9 kg)  Height:     (1.753 m)   Weight change:   Intake/Output Summary (Last 24 hours) at 07/30/16 0717 Last data filed at 07/30/16 0306  Gross per 24 hour  Intake                0 ml  Output               75 ml  Net              -75 ml    Physical Exam: Alert. She does not appear dyspneic. Lungs clear. Heart irregularly irregular. Abdomen soft and nontender. Extremities reveal left knee swelling with effusion. No redness or increased warmth of the left knee. Range of motion significantly diminished.  Lab Results:    Results for orders placed or performed during the hospital encounter of 07/29/16 (from the past 24 hour(s))  Basic metabolic panel     Status: Abnormal   Collection Time: 07/29/16  9:56 PM  Result Value Ref Range   Sodium 133 (L) 135 - 145 mmol/L   Potassium 3.6 3.5 - 5.1 mmol/L   Chloride 100 (L) 101 - 111 mmol/L   CO2 27 22 - 32 mmol/L   Glucose, Bld 133 (H) 65 - 99 mg/dL   BUN 19 6 - 20 mg/dL   Creatinine, Ser 1.61 (H) 0.44 - 1.00 mg/dL   Calcium 8.5 (L) 8.9 - 10.3 mg/dL   GFR calc non Af Amer 31 (L) >60 mL/min   GFR calc Af Amer 36 (L) >60 mL/min   Anion gap 6 5 - 15  Brain natriuretic peptide     Status: Abnormal   Collection Time: 07/29/16  9:56 PM  Result Value Ref Range   B Natriuretic Peptide 557.0 (H) 0.0 - 100.0 pg/mL  Troponin I     Status: Abnormal   Collection Time: 07/29/16  9:56 PM  Result Value Ref Range   Troponin I 0.28 (HH) <0.03 ng/mL  CBC with Differential      Status: Abnormal   Collection Time: 07/29/16  9:56 PM  Result Value Ref Range   WBC 9.4 4.0 - 10.5 K/uL   RBC 4.33 3.87 - 5.11 MIL/uL   Hemoglobin 10.3 (L) 12.0 - 15.0 g/dL   HCT 09.6 (L) 04.5 - 40.9 %   MCV 77.4 (L) 78.0 - 100.0 fL   MCH 23.8 (L) 26.0 - 34.0 pg   MCHC 30.7 30.0 - 36.0 g/dL   RDW 81.1 (H) 91.4 - 78.2 %   Platelets 211 150 - 400 K/uL   Neutrophils Relative % 69 %   Neutro Abs 6.4 1.7 - 7.7 K/uL   Lymphocytes Relative 10 %   Lymphs Abs 0.9 0.7 - 4.0 K/uL   Monocytes Relative 21 %   Monocytes Absolute 2.0 (H) 0.1 - 1.0 K/uL   Eosinophils Relative 0 %   Eosinophils Absolute 0.0 0.0 -  0.7 K/uL   Basophils Relative 0 %   Basophils Absolute 0.0 0.0 - 0.1 K/uL  Urinalysis, Routine w reflex microscopic     Status: Abnormal   Collection Time: 07/29/16 10:30 PM  Result Value Ref Range   Color, Urine AMBER (A) YELLOW   APPearance HAZY (A) CLEAR   Specific Gravity, Urine 1.018 1.005 - 1.030   pH 5.0 5.0 - 8.0   Glucose, UA NEGATIVE NEGATIVE mg/dL   Hgb urine dipstick MODERATE (A) NEGATIVE   Bilirubin Urine NEGATIVE NEGATIVE   Ketones, ur NEGATIVE NEGATIVE mg/dL   Protein, ur 956 (A) NEGATIVE mg/dL   Nitrite NEGATIVE NEGATIVE   Leukocytes, UA TRACE (A) NEGATIVE   RBC / HPF 0-5 0 - 5 RBC/hpf   WBC, UA 6-30 0 - 5 WBC/hpf   Bacteria, UA RARE (A) NONE SEEN   Squamous Epithelial / LPF 0-5 (A) NONE SEEN   Mucous PRESENT   Troponin I (q 6hr x 3)     Status: Abnormal   Collection Time: 07/30/16  2:57 AM  Result Value Ref Range   Troponin I 0.21 (HH) <0.03 ng/mL  Basic metabolic panel     Status: Abnormal   Collection Time: 07/30/16  4:37 AM  Result Value Ref Range   Sodium 134 (L) 135 - 145 mmol/L   Potassium 3.4 (L) 3.5 - 5.1 mmol/L   Chloride 100 (L) 101 - 111 mmol/L   CO2 24 22 - 32 mmol/L   Glucose, Bld 122 (H) 65 - 99 mg/dL   BUN 20 6 - 20 mg/dL   Creatinine, Ser 2.13 (H) 0.44 - 1.00 mg/dL   Calcium 8.1 (L) 8.9 - 10.3 mg/dL   GFR calc non Af Amer 35 (L) >60  mL/min   GFR calc Af Amer 41 (L) >60 mL/min   Anion gap 10 5 - 15     ABGS No results for input(s): PHART, PO2ART, TCO2, HCO3 in the last 72 hours.  Invalid input(s): PCO2 CULTURES No results found for this or any previous visit (from the past 240 hour(s)). Studies/Results: Dg Knee 1-2 Views Left  Result Date: 07/30/2016 CLINICAL DATA:  83 year old female with bilateral lower extremity swelling and left knee pain. EXAM: LEFT KNEE - 1-2 VIEW COMPARISON:  Left knee radiograph dated 08/12/2014 FINDINGS: There is no acute fracture or dislocation. The bones are osteopenic. There is severe osteoarthritic changes of the knee with tricompartmental narrowing most severely involving the lateral compartment. There is a moderate size suprapatellar effusion. There is vascular atherosclerotic disease. IMPRESSION: 1. No acute fracture or dislocation. 2. Severe osteoarthritis. 3. Moderate size suprapatellar effusion. Electronically Signed   By: Elgie Collard M.D.   On: 07/30/2016 01:04   Dg Chest Port 1 View  Result Date: 07/29/2016 CLINICAL DATA:  Shortness of breast started 3 days ago. EXAM: PORTABLE CHEST 1 VIEW COMPARISON:  07/14/2012 FINDINGS: The cardio pericardial silhouette is enlarged. There is pulmonary vascular congestion without overt pulmonary edema. Stable opacity medial right apex. Degenerative changes left shoulder. Telemetry leads overlie the chest. IMPRESSION: 1. Cardiomegaly with vascular congestion. 2. Right apical opacity. Follow-up dedicated PA and lateral chest x-ray, after resolution of acute symptoms, recommended to further evaluate. Electronically Signed   By: Kennith Center M.D.   On: 07/29/2016 22:02   Micro Results: No results found for this or any previous visit (from the past 240 hour(s)). Studies/Results: Dg Knee 1-2 Views Left  Result Date: 07/30/2016 CLINICAL DATA:  83 year old female with bilateral lower extremity swelling  and left knee pain. EXAM: LEFT KNEE - 1-2 VIEW  COMPARISON:  Left knee radiograph dated 08/12/2014 FINDINGS: There is no acute fracture or dislocation. The bones are osteopenic. There is severe osteoarthritic changes of the knee with tricompartmental narrowing most severely involving the lateral compartment. There is a moderate size suprapatellar effusion. There is vascular atherosclerotic disease. IMPRESSION: 1. No acute fracture or dislocation. 2. Severe osteoarthritis. 3. Moderate size suprapatellar effusion. Electronically Signed   By: Elgie Collard M.D.   On: 07/30/2016 01:04   Dg Chest Port 1 View  Result Date: 07/29/2016 CLINICAL DATA:  Shortness of breast started 3 days ago. EXAM: PORTABLE CHEST 1 VIEW COMPARISON:  07/14/2012 FINDINGS: The cardio pericardial silhouette is enlarged. There is pulmonary vascular congestion without overt pulmonary edema. Stable opacity medial right apex. Degenerative changes left shoulder. Telemetry leads overlie the chest. IMPRESSION: 1. Cardiomegaly with vascular congestion. 2. Right apical opacity. Follow-up dedicated PA and lateral chest x-ray, after resolution of acute symptoms, recommended to further evaluate. Electronically Signed   By: Kennith Center M.D.   On: 07/29/2016 22:02   Medications:  I have reviewed the patient's current medications Scheduled Meds: . diltiazem  120 mg Oral BID  . furosemide  20 mg Oral Q48H  . heparin  5,000 Units Subcutaneous Q8H  . losartan  100 mg Oral Daily  . metoprolol  25 mg Oral BID  . multivitamin with minerals  1 tablet Oral BID  . pantoprazole  40 mg Oral Daily  . simvastatin  10 mg Oral QHS  . sodium chloride flush  3 mL Intravenous Q12H  . sodium chloride flush  3 mL Intravenous Q12H   Continuous Infusions: . sodium chloride    . cefTRIAXone (ROCEPHIN)  IV     PRN Meds:.sodium chloride, acetaminophen **OR** acetaminophen, albuterol, bisacodyl, hydrALAZINE, HYDROcodone-acetaminophen, ondansetron **OR** ondansetron (ZOFRAN) IV, polyethylene glycol, sodium  chloride flush, zolpidem   Assessment/Plan: #1. UTI. Await culture. Continue ceftriaxone. #2. Elevated troponin. Trending down from 0.28-0.21. She has had no symptoms of MI. #3. Left knee pain with suprapatellar effusion on x-ray. Consult orthopedics. #4. Chronic diastolic heart failure. Continue Lasix. Chest x-ray reveals cardiomegaly and vascular congestion. Oxygen saturations are normal on room air. #5. Chronic atrial fibrillation. Continue metoprolol and diltiazem. #6. Chronic kidney disease stage III. Stable. #7. Microcytic anemia. Hemoccult negative. Previously evaluated by GI. Recheck anemia profile. Principal Problem:   Acute lower UTI Active Problems:   Atrial fibrillation (HCC)   Hypertension   History of CVA (cerebrovascular accident)   Chronic diastolic CHF (congestive heart failure) (HCC)   Gastroesophageal reflux disease   Arteriosclerotic cardiovascular disease (ASCVD)   UTI (urinary tract infection)   Bronchospasm, acute   Pain and swelling of left knee     LOS: 1 day   Undra Trembath 07/30/2016, 7:17 AM

## 2016-07-31 LAB — GLUCOSE, CAPILLARY: Glucose-Capillary: 104 mg/dL — ABNORMAL HIGH (ref 65–99)

## 2016-07-31 LAB — PATHOLOGIST SMEAR REVIEW

## 2016-07-31 MED ORDER — FERROUS SULFATE 325 (65 FE) MG PO TABS
325.0000 mg | ORAL_TABLET | Freq: Two times a day (BID) | ORAL | Status: DC
  Administered 2016-07-31 – 2016-08-02 (×5): 325 mg via ORAL
  Filled 2016-07-31 (×5): qty 1

## 2016-07-31 NOTE — Evaluation (Signed)
Physical Therapy Evaluation Patient Details Name: [name] MRN: [id number] DOB: Apr 29, 1917 Today's Date: 07/31/2016   History of Present Illness  83 y.o. female with medical history significant for coronary artery disease, chronic atrial fibrillation, chronic diastolic CHF, hypertension, history of CVA, iron deficiency anemia, and right lung nodule who presents to the emergency department for evaluation of lower abdominal pain with foul-smelling urine, pain and swelling to the left knee, and shortness of breath with wheezing. Patient is accompanied by family who assist with the history. She had reportedly been in her usual state of health until roughly a week ago when she began to complain of pain in her lower abdomen. Her family noted her urine to be quite malodorous. Over the last several days, she has also developed progressive dyspnea with wheezing. She also notes pain and swelling involving the left knee without any recent fall or trauma, and reports that she has been unable to ambulate for the past couple days secondary to this. There has not been any recent fevers or chills and the patient denies any recent chest pain or palpitations. She denies any melena or hematochezia. She denies flank pain. No diaphoresis or nausea.  Dx: UTI, SOB with bronchospasm, L knee arthritic joint effusion s/p injection and aspiration on 4/23.    Clinical Impression  Pt received in bed, dtr present, and pt is agreeable to PT evaluation.  She normally ambulates with rollator, and is independent with dressing and bathing.  During PT evaluation today, she required supervision for supine<>sit, however she required Max A for sit<>Stand with RW and elevated bed height.  Once standing she demonstrates posterior lean with poor B hip extension.  She required Max A with RW for transfer bed<>chair.  Further mobility was limited due to pt's poor balance, as well as elevated HR up to 145bpm.  Recommend SNF at this time due to  pt's high risk for falling and need for increased level of assistance for all functional mobility at this time.     Follow Up Recommendations SNF    Equipment Recommendations  None recommended by PT    Recommendations for Other Services       Precautions / Restrictions Precautions Precautions: Fall Precaution Comments: due to immobiltiy Restrictions Weight Bearing Restrictions: No      Mobility  Bed Mobility Overal bed mobility: Needs Assistance Bed Mobility: Supine to Sit     Supine to sit: Supervision;HOB elevated     General bed mobility comments: increased time with vc's for pt to breathe as she was noted to perform valsalva.  Transfers Overall transfer level: Needs assistance Equipment used: Rolling walker (2 wheeled) Transfers: Sit to/from [employer] Sit to Stand: Max assist;From elevated surface Stand pivot transfers: Max assist;From elevated surface       General transfer comment: x 2 trials.  Pt requires education to peform anterior weight and hip extension for improved balance during transfer.  Pt having a coughing fit just before transfer, and performing valsalva during transfer with HR increasing to 145bpm, but SpO2 97% on RA.   Ambulation/Gait Ambulation/Gait assistance:  (NA due to poor balance with transfer. )              Stairs            Wheelchair Mobility    Modified Rankin (Stroke Patients Only)       Balance Overall balance assessment: Needs assistance Sitting-balance support: Bilateral upper extremity supported;Feet supported Sitting balance-Leahy Scale: Good  Standing balance support: Bilateral upper extremity supported Standing balance-Leahy Scale: Poor                               Pertinent Vitals/Pain Pain Assessment: 0-10 Pain Score: 10-Worst pain ever Pain Location: L LE Pain Descriptors / Indicators: Aching;Burning;Sharp;Stabbing;Throbbing Pain Intervention(s): Limited  activity within patient's tolerance;Monitored during session;Premedicated before session;Repositioned    Home Living   Living Arrangements: Children (daughter) Available Help at Discharge: Available 24 hours/day Type of Home: House Home Access: Ramped entrance     Home Layout: One level Home Equipment: Walker - 4 wheels;Walker - 2 wheels;Bedside commode;Cane - single point;Wheelchair - Careers adviser (comment) (lift chair, and hoyer lift that dtr had for her husband)      Prior Function     Gait / Transfers Assistance Needed: Pt uses the rollator walker with 4 wheels for mobiltiy around the house.    ADL's / Homemaking Assistance Needed: independent with dressing, and independent with sponge bathing.         Hand Dominance   Dominant Hand: Right    Extremity/Trunk Assessment   Upper Extremity Assessment Upper Extremity Assessment: Generalized weakness    Lower Extremity Assessment Lower Extremity Assessment: Generalized weakness;RLE deficits/detail;LLE deficits/detail RLE Deficits / Details: Pt demonstrates at least 3/5 strength LLE Deficits / Details: Pt demonstrates at least 3/5 strength - limited due to pain       Communication   Communication: No difficulties  Cognition Arousal/Alertness: Awake/alert Behavior During Therapy: WFL for tasks assessed/performed Overall Cognitive Status: Impaired/Different from baseline Area of Impairment: Orientation                 Orientation Level: Disoriented to;Situation                    General Comments      Exercises     Assessment/Plan    PT Assessment Patient needs continued PT services  PT Problem List Decreased strength;Decreased range of motion;Decreased activity tolerance;Decreased balance;Decreased mobility;Decreased safety awareness;Obesity;Pain       PT Treatment Interventions DME instruction;Gait training;Functional mobility training;Therapeutic activities;Therapeutic exercise;Balance  training;Patient/family education    PT Goals (Current goals can be found in the Care Plan section)  Acute Rehab PT Goals Patient Stated Goal: To get stronger and go back home. PT Goal Formulation: With patient/family Time For Goal Achievement: 08/07/16 Potential to Achieve Goals: Fair    Frequency Min 3X/week   Barriers to discharge        Co-evaluation               End of Session Equipment Utilized During Treatment: Gait belt Activity Tolerance: Patient limited by fatigue;Treatment limited secondary to medical complications (Comment) (elevated HR) Patient left: in chair;with call bell/phone within reach;with family/visitor present Nurse Communication: Mobility status Kendal Hymen, RN notified, and mobility sheet left in pt's room. ) PT Visit Diagnosis: Muscle weakness (generalized) (M62.81);Other abnormalities of gait and mobility (R26.89)    Time: 0454-0981 PT Time Calculation (min) (ACUTE ONLY): 38 min   Charges:   PT Evaluation $PT Eval Low Complexity: 1 Procedure PT Treatments $Therapeutic Activity: 23-37 mins   PT G Codes:   PT G-Codes **NOT FOR INPATIENT CLASS** Functional Assessment Tool Used: AM-PAC 6 Clicks Basic Mobility;Clinical judgement Functional Limitation: Mobility: Walking and moving around Mobility: Walking and Moving Around Current Status ([id number]): At least 40 percent but less than 60 percent impaired, limited or restricted Mobility: Walking and  Moving Around Goal Status [phone]): At least 20 percent but less than 40 percent impaired, limited or restricted    Beth Miller [place], PT, DPT X: [phone]

## 2016-07-31 NOTE — Progress Notes (Signed)
Subjective: Alert and calm but not back to baseline cognitively. She tells me she is in the nursing home. She does not know my name.  Objective: Vital signs in last 24 hours: Vitals:   07/30/16 0450 07/30/16 1344 07/30/16 2100 07/31/16 0500  BP: 133/75 128/69 (!) 148/65 130/60  Pulse: (!) 110 (!) 104 (!) 155 (!) 102  Resp: Temp: 97.6 F (36.4 C) 98.1 F (36.7 C) 98.2 F (36.8 C) 98.6 F (37 C)  TempSrc: Axillary Oral Oral Oral  SpO2: 96% 98% 95% 95%  Weight: 182 lb 12.8 oz (82.9 kg)     Height:  (1.753 m)      Weight change:   Intake/Output Summary (Last 24 hours) at 07/31/16 0717 Last data filed at 07/30/16 1700  Gross per 24 hour  Intake              480 ml  Output                0 ml  Net              480 ml    Physical Exam: Lungs clear. Heart irregularly irregular in the 90-110 range on telemetry. Abdomen soft and mildly tender in the suprapubic area. Extremities reveal decreased swelling in the left knee. No redness noted.  Lab Results:    Results for orders placed or performed during the hospital encounter of 07/29/16 (from the past 24 hour(s))  Glucose, capillary     Status: Abnormal   Collection Time: 07/30/16  7:41 AM  Result Value Ref Range   Glucose-Capillary 113 (H) 65 - 99 mg/dL  Troponin I (q 6hr x 3)     Status: Abnormal   Collection Time: 07/30/16  8:32 AM  Result Value Ref Range   Troponin I 0.16 (HH) <0.03 ng/mL  Vitamin B12     Status: None   Collection Time: 07/30/16  8:32 AM  Result Value Ref Range   Vitamin B-12 360 180 - 914 pg/mL  Folate     Status: None   Collection Time: 07/30/16  8:32 AM  Result Value Ref Range   Folate 16.3 >5.9 ng/mL  Iron and TIBC     Status: Abnormal   Collection Time: 07/30/16  8:32 AM  Result Value Ref Range   Iron 13 (L) 28 - 170 ug/dL   TIBC 161 096 - 045 ug/dL   Saturation Ratios 4 (L) 10.4 - 31.8 %   UIBC 308 ug/dL  Ferritin     Status: None   Collection Time: 07/30/16  8:32 AM   Result Value Ref Range   Ferritin 42 11 - 307 ng/mL  Reticulocytes     Status: None   Collection Time: 07/30/16  8:32 AM  Result Value Ref Range   Retic Ct Pct 1.3 0.4 - 3.1 %   RBC. 4.49 3.87 - 5.11 MIL/uL   Retic Count, Manual 58.4 19.0 - 186.0 K/uL  Troponin I (q 6hr x 3)     Status: Abnormal   Collection Time: 07/30/16  3:42 PM  Result Value Ref Range   Troponin I 0.13 (HH) <0.03 ng/mL  Synovial cell count + diff, w/ crystals     Status: Abnormal   Collection Time: 07/30/16  5:00 PM  Result Value Ref Range   Color, Synovial YELLOW YELLOW   Appearance-Synovial TURBID (A) CLEAR   Crystals, Fluid NO CRYSTALS SEEN    WBC, Synovial 9,405 (H) 0 -  200 /cu mm   Neutrophil, Synovial 88 (H) 0 - 25 %   Lymphocytes-Synovial Fld 0 0 - 20 %   Monocyte-Macrophage-Synovial Fluid 12 (L) 50 - 90 %   Eosinophils-Synovial 0 0 - 1 %   Other Cells-SYN OTHER CELLS UNIDENTIFIED; SEE CYTOLOGY REPORT   Gram stain     Status: None   Collection Time: 07/30/16  5:00 PM  Result Value Ref Range   Specimen Description SYNOVIAL LEFT KNEE    Special Requests NONE    Gram Stain      CYTOSPIN SMEAR NO ORGANISMS SEEN WBC PRESENT, PREDOMINANTLY PMN    Report Status 07/30/2016 FINAL      ABGS No results for input(s): PHART, PO2ART, TCO2, HCO3 in the last 72 hours.  Invalid input(s): PCO2 CULTURES Recent Results (from the past 240 hour(s))  Gram stain     Status: None   Collection Time: 07/30/16  5:00 PM  Result Value Ref Range Status   Specimen Description SYNOVIAL LEFT KNEE  Final   Special Requests NONE  Final   Gram Stain   Final    CYTOSPIN SMEAR NO ORGANISMS SEEN WBC PRESENT, PREDOMINANTLY PMN    Report Status 07/30/2016 FINAL  Final   Studies/Results: Dg Knee 1-2 Views Left  Result Date: 07/30/2016 CLINICAL DATA:  83 year old female with bilateral lower extremity swelling and left knee pain. EXAM: LEFT KNEE - 1-2 VIEW COMPARISON:  Left knee radiograph dated 08/12/2014 FINDINGS: There  is no acute fracture or dislocation. The bones are osteopenic. There is severe osteoarthritic changes of the knee with tricompartmental narrowing most severely involving the lateral compartment. There is a moderate size suprapatellar effusion. There is vascular atherosclerotic disease. IMPRESSION: 1. No acute fracture or dislocation. 2. Severe osteoarthritis. 3. Moderate size suprapatellar effusion. Electronically Signed   By: Elgie Collard M.D.   On: 07/30/2016 01:04   Dg Chest Port 1 View  Result Date: 07/29/2016 CLINICAL DATA:  Shortness of breast started 3 days ago. EXAM: PORTABLE CHEST 1 VIEW COMPARISON:  07/14/2012 FINDINGS: The cardio pericardial silhouette is enlarged. There is pulmonary vascular congestion without overt pulmonary edema. Stable opacity medial right apex. Degenerative changes left shoulder. Telemetry leads overlie the chest. IMPRESSION: 1. Cardiomegaly with vascular congestion. 2. Right apical opacity. Follow-up dedicated PA and lateral chest x-ray, after resolution of acute symptoms, recommended to further evaluate. Electronically Signed   By: Kennith Center M.D.   On: 07/29/2016 22:02   Micro Results: Recent Results (from the past 240 hour(s))  Gram stain     Status: None   Collection Time: 07/30/16  5:00 PM  Result Value Ref Range Status   Specimen Description SYNOVIAL LEFT KNEE  Final   Special Requests NONE  Final   Gram Stain   Final    CYTOSPIN SMEAR NO ORGANISMS SEEN WBC PRESENT, PREDOMINANTLY PMN    Report Status 07/30/2016 FINAL  Final   Studies/Results: Dg Knee 1-2 Views Left  Result Date: 07/30/2016 CLINICAL DATA:  83 year old female with bilateral lower extremity swelling and left knee pain. EXAM: LEFT KNEE - 1-2 VIEW COMPARISON:  Left knee radiograph dated 08/12/2014 FINDINGS: There is no acute fracture or dislocation. The bones are osteopenic. There is severe osteoarthritic changes of the knee with tricompartmental narrowing most severely involving the  lateral compartment. There is a moderate size suprapatellar effusion. There is vascular atherosclerotic disease. IMPRESSION: 1. No acute fracture or dislocation. 2. Severe osteoarthritis. 3. Moderate size suprapatellar effusion. Electronically Signed   By: Burtis Junes  Radparvar M.D.   On: 07/30/2016 01:04   Dg Chest Port 1 View  Result Date: 07/29/2016 CLINICAL DATA:  Shortness of breast started 3 days ago. EXAM: PORTABLE CHEST 1 VIEW COMPARISON:  07/14/2012 FINDINGS: The cardio pericardial silhouette is enlarged. There is pulmonary vascular congestion without overt pulmonary edema. Stable opacity medial right apex. Degenerative changes left shoulder. Telemetry leads overlie the chest. IMPRESSION: 1. Cardiomegaly with vascular congestion. 2. Right apical opacity. Follow-up dedicated PA and lateral chest x-ray, after resolution of acute symptoms, recommended to further evaluate. Electronically Signed   By: Kennith Center M.D.   On: 07/29/2016 22:02   Medications:  I have reviewed the patient's current medications Scheduled Meds: . diltiazem  120 mg Oral BID  . ferrous sulfate  325 mg Oral BID WC  . furosemide  20 mg Oral Daily  . heparin  5,000 Units Subcutaneous Q8H  . losartan  100 mg Oral Daily  . metoprolol  25 mg Oral BID  . multivitamin with minerals  1 tablet Oral BID  . pantoprazole  40 mg Oral Daily  . potassium chloride  20 mEq Oral TID  . simvastatin  10 mg Oral QHS  . sodium chloride flush  3 mL Intravenous Q12H  . sodium chloride flush  3 mL Intravenous Q12H   Continuous Infusions: . sodium chloride    . cefTRIAXone (ROCEPHIN)  IV Stopped (07/30/16 2348)   PRN Meds:.sodium chloride, acetaminophen **OR** acetaminophen, albuterol, bisacodyl, hydrALAZINE, HYDROcodone-acetaminophen, ondansetron **OR** ondansetron (ZOFRAN) IV, polyethylene glycol, sodium chloride flush   Assessment/Plan: #1. UTI. Continue ceftriaxone. Urine culture pending. No fever. #2. Left knee effusion. Appreciate  orthopedic consultation. 80 mL of cloudy yellow fluid was aspirated. Gram stain reveals no organisms. White count 9405 with 88% neutrophils. Culture pending. She seems to be in less pain with it today. Will try her have her up at meals. Physical therapy is consulted. #3. Chronic diastolic heart failure. Continue Lasix. #4. Iron deficiency anemia. Ferritin 42. Iron is low. Vitamin B-12 and folic acid are normal. Start iron twice a day. Previous decision has been made not to pursue additional GI workup. #5. Chronic atrial fibrillation. Continue diltiazem CD and metoprolol. Principal Problem:   Acute lower UTI Active Problems:   Atrial fibrillation (HCC)   Hypertension   History of CVA (cerebrovascular accident)   Chronic diastolic CHF (congestive heart failure) (HCC)   Gastroesophageal reflux disease   Arteriosclerotic cardiovascular disease (ASCVD)   UTI (urinary tract infection)   Bronchospasm, acute   Pain and swelling of left knee     LOS: 2 days   Brayli Klingbeil 07/31/2016, 7:17 AM

## 2016-07-31 NOTE — Care Management (Signed)
Pt/family has refused SNF. They have used [facility] in the past for [facility] services, explained [facility] unable to provide nursing services. Pt's daughter would like nursing if we can find another company to provide them. CM will determine which agencies are able to provide care for pt and will given them list of options prior to DC.

## 2016-07-31 NOTE — Final Consult Note (Signed)
07/31/2016  Patient ID: [name], female   DOB: 15-Mar-1918, 83 y.o.   MRN: [id number]  Chief Complaint  Patient presents with  . Left knee effusion     HPI 83 year old female with left knee effusion and inability to weight-bear status post aspiration on April 23. Cloudy fluid approximately 80 mL aspirated sent for culture and cell count crystal analysis ROS  Shortness of breath  BP 130/60 (BP Location: Right Arm)   Pulse (!) 102   Temp 98.6 F (37 C) (Oral)   Resp 20   Ht  (1.753 m)   Wt 182 lb 12.8 oz (82.9 kg)   SpO2 95%   BMI 26.99 kg/m  Ortho Exam  One-day Gram stain results: No organism seen white blood cells present are dominantly polymorphonuclear leukocytes  Culture is still in ending status  Fluid cell count left knee 9000 white cells, 88% synovial neutrophils, 12% monocytes macro visors, path report pending for unidentified cells. No crystals were seen.  A/P  Medical decision-making  At this point this is a noninfectious fluid, based on the patient's radiographs and overall clinical situation this is most likely an arthritic effusion.  Final comments and treatment decision will be made after complete fluid analysis has been completed  Fuller Canada, MD 07/31/2016 8:43 AM

## 2016-07-31 NOTE — Clinical Social Work Note (Addendum)
Patient's daughter, Ms. Law, was at bedside. Patient was not agreeable to SNF. At baseline patient walker with a walker and was very independent in her ADLs.  Ms. Conni Elliot stated that she would prepare meals for patient. Patient and daughter desire HH/PT services. CM aware. LCSW signing off.    Sem Mccaughey, Juleen China, LCSW

## 2016-07-31 NOTE — Care Management Note (Signed)
Case Management Note  Patient Details  Name: [name] MRN: [id number] Date of Birth: 1917/05/10  Subjective/Objective:                  Pt admitted with UTI and AMS. She is from home, lives with her daughter. She has walker, WC, BSC and elevated potty seat at home. Per daughter at baseline she is alert, oriented, ind with ADL's, uses a walker with ambulation. She has had HH and been to SNF in the past. PT has recommended SNF, CSW is aware.   Action/Plan: CSW will see pt and make arrangements for placement. No CM needs anticipated.   Expected Discharge Date:  07/31/16               Expected Discharge Plan:  Skilled Nursing Facility  In-House Referral:  Clinical Social Work  Discharge planning Services  CM Consult  Post Acute Care Choice:  NA Choice offered to:  NA  Status of Service:  Completed, signed off  Malcolm Metro, RN 07/31/2016, 12:31 PM

## 2016-08-01 LAB — URINE CULTURE: Culture: >=100000 — AB

## 2016-08-01 LAB — BASIC METABOLIC PANEL
ANION GAP: 6 (ref 5–15)
BUN: 31 mg/dL — ABNORMAL HIGH (ref 6–20)
CHLORIDE: 102 mmol/L (ref 101–111)
CO2: 27 mmol/L (ref 22–32)
Calcium: 8.4 mg/dL — ABNORMAL LOW (ref 8.9–10.3)
Creatinine, Ser: 1.56 mg/dL — ABNORMAL HIGH (ref 0.44–1.00)
GFR calc non Af Amer: 27 mL/min — ABNORMAL LOW (ref >60)
GFR, EST AFRICAN AMERICAN: 31 mL/min — AB (ref >60)
Glucose, Bld: 101 mg/dL — ABNORMAL HIGH (ref 65–99)
Potassium: 5.1 mmol/L (ref 3.5–5.1)
Sodium: 135 mmol/L (ref 135–145)

## 2016-08-01 LAB — GLUCOSE, CAPILLARY: GLUCOSE-CAPILLARY: 96 mg/dL (ref 65–99)

## 2016-08-01 MED ORDER — MAGIC MOUTHWASH W/LIDOCAINE: 5.0000 mL | Freq: Four times a day (QID) | ORAL | Status: DC

## 2016-08-01 MED ORDER — LIDOCAINE VISCOUS 2 % MT SOLN
5.0000 mL | Freq: Four times a day (QID) | OROMUCOSAL | Status: DC
  Administered 2016-08-01 – 2016-08-02 (×4): 5 mL via OROMUCOSAL
  Filled 2016-08-01 (×3): qty 15

## 2016-08-01 MED ORDER — MAGIC MOUTHWASH
5.0000 mL | Freq: Four times a day (QID) | ORAL | Status: DC
  Administered 2016-08-01 – 2016-08-02 (×4): 5 mL via ORAL
  Filled 2016-08-01 (×4): qty 5

## 2016-08-01 NOTE — Clinical Social Work Placement (Signed)
   CLINICAL SOCIAL WORK PLACEMENT  NOTE  Date:  08/01/2016  Patient Details  Name: [name] MRN: [id number] Date of Birth: 1918/01/17  Clinical Social Work is seeking post-discharge placement for this patient at the Skilled  Nursing Facility level of care (*CSW will initial, date and re-position this form in  chart as items are completed):  Yes   Patient/family provided with [facility] Clinical Social Work Department's list of facilities offering this level of care within the geographic area requested by the patient (or if unable, by the patient's family).  Yes   Patient/family informed of their freedom to choose among providers that offer the needed level of care, that participate in Medicare, Medicaid or managed care program needed by the patient, have an available bed and are willing to accept the patient.  Yes   Patient/family informed of [facility]'s ownership interest in [facility] and [facility], as well as of the fact that they are under no obligation to receive care at these facilities.  PASRR submitted to EDS on 08/01/16     PASRR number received on 08/01/16     Existing PASRR number confirmed on       FL2 transmitted to all facilities in geographic area requested by pt/family on 08/01/16     FL2 transmitted to all facilities within larger geographic area on       Patient informed that his/her managed care company has contracts with or will negotiate with certain facilities, including the following:        Yes   Patient/family informed of bed offers received.  Patient chooses bed at [facility]     Physician recommends and patient chooses bed at      Patient to be transferred to   on  .  Patient to be transferred to facility by       Patient family notified on   of transfer.  Name of family member notified:        PHYSICIAN       Additional Comment:    _______________________________________________ Annice Needy,  LCSW 08/01/2016, 11:20 AM

## 2016-08-01 NOTE — Clinical Social Work Placement (Signed)
   CLINICAL SOCIAL WORK PLACEMENT  NOTE  Date:  08/01/2016  Patient Details  Name: [name] MRN: [id number] Date of Birth: 1918-02-21  Clinical Social Work is seeking post-discharge placement for this patient at the Skilled  Nursing Facility level of care (*CSW will initial, date and re-position this form in  chart as items are completed):  Yes   Patient/family provided with [facility] Clinical Social Work Department's list of facilities offering this level of care within the geographic area requested by the patient (or if unable, by the patient's family).  Yes   Patient/family informed of their freedom to choose among providers that offer the needed level of care, that participate in Medicare, Medicaid or managed care program needed by the patient, have an available bed and are willing to accept the patient.  Yes   Patient/family informed of [facility]'s ownership interest in [facility] and [facility], as well as of the fact that they are under no obligation to receive care at these facilities.  PASRR submitted to EDS on 08/01/16     PASRR number received on 08/01/16     Existing PASRR number confirmed on       FL2 transmitted to all facilities in geographic area requested by pt/family on 08/01/16     FL2 transmitted to all facilities within larger geographic area on       Patient informed that his/her managed care company has contracts with or will negotiate with certain facilities, including the following:            Patient/family informed of bed offers received.  Patient chooses bed at       Physician recommends and patient chooses bed at      Patient to be transferred to   on  .  Patient to be transferred to facility by       Patient family notified on   of transfer.  Name of family member notified:        PHYSICIAN       Additional Comment:    _______________________________________________ Annice Needy, LCSW 08/01/2016, 10:37 AM

## 2016-08-01 NOTE — Clinical Social Work Note (Signed)
Per CM, after patient/family spoke with attending they are now agreeable to SNF.   Rolla Servidio, Juleen China, LCSW

## 2016-08-01 NOTE — Care Management (Signed)
Per Dr. Ouida Sills, patient is now agreeable to SNF. CSW notified.

## 2016-08-01 NOTE — Final Consult Note (Signed)
Labs are normal on the synovial fluid   Symptomatic treatment at this time

## 2016-08-01 NOTE — NC FL2 (Signed)
[place] MEDICAID FL2 LEVEL OF CARE SCREENING TOOL     IDENTIFICATION  Patient Name: [name] Birthdate: 1917/10/14 Sex: female Admission Date (Current Location): 07/29/2016  [facility] and [place] Number:  [employer] and Address:  [facility],  [address], [place] [address]      Provider Number: [id number]  Attending Physician Name and Address:  Carylon Perches, MD  Relative Name and Phone Number:       Current Level of Care: Hospital Recommended Level of Care: Skilled Nursing Facility Prior Approval Number:    Date Approved/Denied:   PASRR Number: [id number] ([id number])  Discharge Plan: SNF    Current Diagnoses: Patient Active Problem List   Diagnosis Date Noted  . Elevated troponin   . UTI (urinary tract infection) 07/29/2016  . Bronchospasm, acute 07/29/2016  . Pain and swelling of left knee 07/29/2016  . Left hip pain 08/10/2014  . Acute lower UTI 08/10/2014  . Hypertension   . History of CVA (cerebrovascular accident)   . Chronic diastolic CHF (congestive heart failure) (HCC)   . Gastroesophageal reflux disease   . Hyperlipidemia   . Arteriosclerotic cardiovascular disease (ASCVD)   . Atrial fibrillation (HCC) 07/04/2012  . Bradycardia 06/23/2012    Orientation RESPIRATION BLADDER Height & Weight     Self, Time, Situation, Place    Incontinent Weight: 182 lb 12.2 oz (82.9 kg) Height:   (175.3 cm)  BEHAVIORAL SYMPTOMS/MOOD NEUROLOGICAL BOWEL NUTRITION STATUS      Continent Diet (Heart Healthy, Fluid restriction 1500 mL fluid)  AMBULATORY STATUS COMMUNICATION OF NEEDS Skin   Limited Assist Verbally Normal                       Personal Care Assistance Level of Assistance  Bathing, Feeding, Dressing Bathing Assistance: Limited assistance Feeding assistance: Independent Dressing Assistance: Limited assistance     Functional Limitations Info  Sight, Hearing, Speech Sight Info: Adequate Hearing Info:  Adequate Speech Info: Adequate    SPECIAL CARE FACTORS FREQUENCY  PT (By licensed PT)     PT Frequency: 5x/week              Contractures Contractures Info: Not present    Additional Factors Info  Code Status, Allergies Code Status Info: DNR Allergies Info: Ciprofloxacin           Current Medications (08/01/2016):  This is the current hospital active medication list Current Facility-Administered Medications  Medication Dose Route Frequency Provider Last Rate Last Dose  . 0.9 %  sodium chloride infusion  250 mL Intravenous PRN Briscoe Deutscher, MD      . acetaminophen (TYLENOL) tablet 650 mg  650 mg Oral Q6H PRN Briscoe Deutscher, MD       Or  . acetaminophen (TYLENOL) suppository 650 mg  650 mg Rectal Q6H PRN Lavone Neri Opyd, MD      . albuterol (PROVENTIL) (2.5 MG/3ML) 0.083% nebulizer solution 2.5 mg  2.5 mg Nebulization Q6H PRN Briscoe Deutscher, MD      . bisacodyl (DULCOLAX) EC tablet 5 mg  5 mg Oral Daily PRN Briscoe Deutscher, MD      . cefTRIAXone (ROCEPHIN) 1 g in dextrose 5 % 50 mL IVPB  1 g Intravenous Q24H Briscoe Deutscher, MD   Stopped at 07/31/16 2258  . diltiazem (CARDIZEM CD) 24 hr capsule 120 mg  120 mg Oral BID Briscoe Deutscher, MD   120 mg at 07/31/16  2121  . ferrous sulfate tablet 325 mg  325 mg Oral BID WC Carylon Perches, MD   325 mg at 07/31/16 1642  . heparin injection 5,000 Units  5,000 Units Subcutaneous Q8H Briscoe Deutscher, MD   5,000 Units at 08/01/16 0603  . hydrALAZINE (APRESOLINE) injection 5-10 mg  5-10 mg Intravenous Q4H PRN Briscoe Deutscher, MD      . HYDROcodone-acetaminophen (NORCO/VICODIN) 5-325 MG per tablet 1-2 tablet  1-2 tablet Oral Q4H PRN Briscoe Deutscher, MD   2 tablet at 07/31/16 1642  . losartan (COZAAR) tablet 100 mg  100 mg Oral Daily Briscoe Deutscher, MD   100 mg at 07/31/16 1105  . metoprolol tartrate (LOPRESSOR) tablet 25 mg  25 mg Oral BID Briscoe Deutscher, MD   25 mg at 07/31/16 2121  . multivitamin with minerals tablet 1 tablet  1 tablet Oral BID  Briscoe Deutscher, MD   1 tablet at 07/31/16 2058  . ondansetron (ZOFRAN) tablet 4 mg  4 mg Oral Q6H PRN Briscoe Deutscher, MD       Or  . ondansetron (ZOFRAN) injection 4 mg  4 mg Intravenous Q6H PRN Briscoe Deutscher, MD   4 mg at 07/31/16 1639  . pantoprazole (PROTONIX) EC tablet 40 mg  40 mg Oral Daily Briscoe Deutscher, MD   40 mg at 07/31/16 1105  . polyethylene glycol (MIRALAX / GLYCOLAX) packet 17 g  17 g Oral Daily PRN Briscoe Deutscher, MD      . simvastatin (ZOCOR) tablet 10 mg  10 mg Oral QHS Briscoe Deutscher, MD   10 mg at 07/31/16 2121  . sodium chloride flush (NS) 0.9 % injection 3 mL  3 mL Intravenous Q12H Briscoe Deutscher, MD   3 mL at 07/31/16 2122  . sodium chloride flush (NS) 0.9 % injection 3 mL  3 mL Intravenous Q12H Briscoe Deutscher, MD   3 mL at 07/31/16 2121  . sodium chloride flush (NS) 0.9 % injection 3 mL  3 mL Intravenous PRN Briscoe Deutscher, MD         Discharge Medications: Please see discharge summary for a list of discharge medications.  Relevant Imaging Results:  Relevant Lab Results:   Additional Information SSN [id number] Juleen China, LCSW

## 2016-08-01 NOTE — Progress Notes (Signed)
Subjective: She denies any abdominal pain. Left knee pain is better but is still quite limiting to her mobility. Daughter's ability to care for home would be challenging.  Objective: Vital signs in last 24 hours: Vitals:   07/31/16 1441 07/31/16 2155 08/01/16 0500 08/01/16 0643  BP: (!) 120/57 127/63  113/61  Pulse: 76 97  91  Resp: Temp: 97.6 F (36.4 C) 97.5 F (36.4 C)  97.8 F (36.6 C)  TempSrc: Oral Oral  Oral  SpO2: 99% 98%  91%  Weight:   182 lb 12.2 oz (82.9 kg)   Height:       Weight change:   Intake/Output Summary (Last 24 hours) at 08/01/16 0700 Last data filed at 07/31/16 2258  Gross per 24 hour  Intake              580 ml  Output                0 ml  Net              580 ml    Physical Exam: Alert. No distress. Lungs clear. Heart irregularly irregular in the 80s. Abdomen nontender with no hepatosplenomegaly. Extremities reveal no edema. Left knee reveals no redness.  Lab Results:    Results for orders placed or performed during the hospital encounter of 07/29/16 (from the past 24 hour(s))  Glucose, capillary     Status: Abnormal   Collection Time: 07/31/16  7:48 AM  Result Value Ref Range   Glucose-Capillary 104 (H) 65 - 99 mg/dL   Comment 1 Notify RN    Comment 2 Document in Chart      ABGS No results for input(s): PHART, PO2ART, TCO2, HCO3 in the last 72 hours.  Invalid input(s): PCO2 CULTURES Recent Results (from the past 240 hour(s))  Urine culture     Status: Abnormal (Preliminary result)   Collection Time: 07/29/16 10:30 PM  Result Value Ref Range Status   Specimen Description URINE, CATHETERIZED  Final   Special Requests NONE  Final   Culture (A)  Final    >=100,000 COLONIES/mL ESCHERICHIA COLI SUSCEPTIBILITIES TO FOLLOW Performed at [facility], [address], [place], [place] [address]    Report Status PENDING  Incomplete  Body fluid culture     Status: None (Preliminary result)   Collection Time: 07/30/16  5:00  PM  Result Value Ref Range Status   Specimen Description SYNOVIAL LEFT KNEE  Final   Special Requests NONE  Final   Gram Stain   Final    CYTOSPIN SMEAR NO ORGANISMS SEEN WBC PRESENT, PREDOMINANTLY PMN    Culture   Final    NO GROWTH < 12 HOURS Performed at [facility], [address], [place], [place] [address]    Report Status PENDING  Incomplete  Gram stain     Status: None   Collection Time: 07/30/16  5:00 PM  Result Value Ref Range Status   Specimen Description SYNOVIAL LEFT KNEE  Final   Special Requests NONE  Final   Gram Stain   Final    CYTOSPIN SMEAR NO ORGANISMS SEEN WBC PRESENT, PREDOMINANTLY PMN    Report Status 07/30/2016 FINAL  Final   Studies/Results: No results found. Micro Results: Recent Results (from the past 240 hour(s))  Urine culture     Status: Abnormal (Preliminary result)   Collection Time: 07/29/16 10:30 PM  Result Value Ref Range Status   Specimen  Description URINE, CATHETERIZED  Final   Special Requests NONE  Final   Culture (A)  Final    >=100,000 COLONIES/mL ESCHERICHIA COLI SUSCEPTIBILITIES TO FOLLOW Performed at [facility], [address], [place], [place] [address]    Report Status PENDING  Incomplete  Body fluid culture     Status: None (Preliminary result)   Collection Time: 07/30/16  5:00 PM  Result Value Ref Range Status   Specimen Description SYNOVIAL LEFT KNEE  Final   Special Requests NONE  Final   Gram Stain   Final    CYTOSPIN SMEAR NO ORGANISMS SEEN WBC PRESENT, PREDOMINANTLY PMN    Culture   Final    NO GROWTH < 12 HOURS Performed at [facility], [address] [place], [place] [address]    Report Status PENDING  Incomplete  Gram stain     Status: None   Collection Time: 07/30/16  5:00 PM  Result Value Ref Range Status   Specimen Description SYNOVIAL LEFT KNEE  Final   Special Requests NONE  Final   Gram Stain   Final    CYTOSPIN SMEAR NO ORGANISMS SEEN WBC PRESENT, PREDOMINANTLY PMN     Report Status 07/30/2016 FINAL  Final   Studies/Results: No results found. Medications:  I have reviewed the patient's current medications Scheduled Meds: . diltiazem  120 mg Oral BID  . ferrous sulfate  325 mg Oral BID WC  . furosemide  20 mg Oral Daily  . heparin  5,000 Units Subcutaneous Q8H  . losartan  100 mg Oral Daily  . metoprolol  25 mg Oral BID  . multivitamin with minerals  1 tablet Oral BID  . pantoprazole  40 mg Oral Daily  . simvastatin  10 mg Oral QHS  . sodium chloride flush  3 mL Intravenous Q12H  . sodium chloride flush  3 mL Intravenous Q12H   Continuous Infusions: . sodium chloride    . cefTRIAXone (ROCEPHIN)  IV Stopped (07/31/16 2258)   PRN Meds:.sodium chloride, acetaminophen **OR** acetaminophen, albuterol, bisacodyl, hydrALAZINE, HYDROcodone-acetaminophen, ondansetron **OR** ondansetron (ZOFRAN) IV, polyethylene glycol, sodium chloride flush   Assessment/Plan: #1. Escherichia coli UTI. Sensitivities pending. Continue ceftriaxone. No fever. #2. Left knee effusion. Culture negative so far. No crystals. #3. Iron deficiency anemia. Continue supplemental iron. #4. Chronic diastolic heart failure. Continue Lasix. Metabolic profile pending. #5. Chronic atrial fibrillation. Stable. Discontinue telemetry.  Will likely need SNF. Principal Problem:   Acute lower UTI Active Problems:   Atrial fibrillation (HCC)   Hypertension   History of CVA (cerebrovascular accident)   Chronic diastolic CHF (congestive heart failure) (HCC)   Gastroesophageal reflux disease   Arteriosclerotic cardiovascular disease (ASCVD)   UTI (urinary tract infection)   Bronchospasm, acute   Pain and swelling of left knee     LOS: 3 days   Delynn Olvera 08/01/2016, 7:00 AM

## 2016-08-02 LAB — GLUCOSE, CAPILLARY: Glucose-Capillary: 96 mg/dL (ref 65–99)

## 2016-08-02 MED ORDER — GUAIFENESIN-DM 100-10 MG/5ML PO SYRP
5.0000 mL | ORAL_SOLUTION | ORAL | Status: DC | PRN
  Filled 2016-08-02: qty 5

## 2016-08-02 MED ORDER — FERROUS SULFATE 325 (65 FE) MG PO TABS: 325.0000 mg | ORAL_TABLET | Freq: Two times a day (BID) | ORAL | 3 refills | Status: DC

## 2016-08-02 MED ORDER — GUAIFENESIN 100 MG/5ML PO SOLN
10.0000 mL | Freq: Four times a day (QID) | ORAL | Status: DC | PRN
  Filled 2016-08-02: qty 50

## 2016-08-02 MED ORDER — GUAIFENESIN-DM 100-10 MG/5ML PO SYRP
10.0000 mL | ORAL_SOLUTION | Freq: Four times a day (QID) | ORAL | Status: DC | PRN
  Administered 2016-08-02: 10 mL via ORAL
  Filled 2016-08-02: qty 10

## 2016-08-02 MED ORDER — PREDNISONE 5 MG PO TABS: 5.0000 mg | ORAL_TABLET | Freq: Every day | ORAL | 3 refills | Status: DC

## 2016-08-02 MED ORDER — CEPHALEXIN 500 MG PO CAPS: 500.0000 mg | ORAL_CAPSULE | Freq: Two times a day (BID) | ORAL | 0 refills | Status: DC

## 2016-08-02 MED ORDER — CEPHALEXIN 500 MG PO CAPS: 500.0000 mg | ORAL_CAPSULE | Freq: Two times a day (BID) | ORAL | Status: DC

## 2016-08-02 MED ORDER — GUAIFENESIN-DM 100-10 MG/5ML PO SYRP: 10.0000 mL | ORAL_SOLUTION | Freq: Four times a day (QID) | ORAL | 0 refills | Status: DC | PRN

## 2016-08-02 MED ORDER — ASPIRIN EC 325 MG PO TBEC: 325.0000 mg | DELAYED_RELEASE_TABLET | Freq: Every day | ORAL | 0 refills | Status: DC

## 2016-08-02 NOTE — Care Management Important Message (Signed)
Important Message  Patient Details  Name: [name] MRN: [id number] Date of Birth: 04/07/1918   Medicare Important Message Given:  Yes    Malcolm Metro, RN 08/02/2016, 9:47 AM

## 2016-08-02 NOTE — Progress Notes (Signed)
AVS reviewed with patient's daughter.  Verbalized understanding of discharge instructions, physician follow-up, medications.  [facility] to be arranged per Case Management.  Patient transported by NT via w/c to main entrance for discharge.  Patient stable at time of discharge.

## 2016-08-02 NOTE — Care Management Note (Signed)
Case Management Note  Patient Details  Name: TATIONNA FULLARD MRN: [id number] Date of Birth: 1917-06-07  Expected Discharge Date:  08/02/16               Expected Discharge Plan:  Skilled Nursing Facility  In-House Referral:  Clinical Social Work  Discharge planning Services  CM Consult  Post Acute Care Choice:  NA Choice offered to:  NA  Status of Service:  Completed, signed off  Additional Comments: Pt discharging home today with self care. CSW has made arrangements. No CM needs.   Malcolm Metro, RN 08/02/2016, 9:47 AM

## 2016-08-02 NOTE — Care Management Note (Signed)
Case Management Note  Patient Details  Name: [name] MRN: [id number] Date of Birth: 01-13-1918   Expected Discharge Date:  08/02/16               Expected Discharge Plan:  Home w Home Health Services  In-House Referral:  Clinical Social Work  Discharge planning Services  CM Consult  Post Acute Care Choice:  Home Health Choice offered to:  Patient  HH Arranged:  RN, PT [facility] Agency:  [facility]  Status of Service:  Completed, signed off  Additional Comments: CM contacted by RN, family wanting to take pt home and not to [facility]. CM contacted MD to place [facility] orders. [name], at [facility] aware of referral and will obtain pt info from chart. Daughter aware HH has 48hrs to make first visit.   Malcolm Metro, RN 08/02/2016, 4:28 PM

## 2016-08-02 NOTE — Clinical Social Work Placement (Signed)
   CLINICAL SOCIAL WORK PLACEMENT  NOTE  Date:  08/02/2016  Patient Details  Name: [name] MRN: [id number] Date of Birth: 08-20-1917  Clinical Social Work is seeking post-discharge placement for this patient at the Skilled  Nursing Facility level of care (*CSW will initial, date and re-position this form in  chart as items are completed):  Yes   Patient/family provided with [place] Clinical Social Work Department's list of facilities offering this level of care within the geographic area requested by the patient (or if unable, by the patient's family).  Yes   Patient/family informed of their freedom to choose among providers that offer the needed level of care, that participate in Medicare, Medicaid or managed care program needed by the patient, have an available bed and are willing to accept the patient.  Yes   Patient/family informed of [place] [facility]'s ownership interest in [facility] and [facility], as well as of the fact that they are under no obligation to receive care at these facilities.  PASRR submitted to EDS on 08/01/16     PASRR number received on 08/01/16     Existing PASRR number confirmed on       FL2 transmitted to all facilities in geographic area requested by pt/family on 08/01/16     FL2 transmitted to all facilities within larger geographic area on       Patient informed that his/her managed care company has contracts with or will negotiate with certain facilities, including the following:        Yes   Patient/family informed of bed offers received.  Patient chooses bed at [facility]     Physician recommends and patient chooses bed at      Patient to be transferred to [facility] on 08/02/16.  Patient to be transferred to facility by [facility] Staff     Patient family notified on 08/02/16 of transfer.  Name of family member notified:  daughter     PHYSICIAN       Additional Comment:     _______________________________________________ Annice Needy, LCSW 08/02/2016, 12:06 PM

## 2016-08-02 NOTE — Progress Notes (Signed)
Late entry:  Report called to Greggory Stallion, nurse at [facility].  Patient's daughter concerned about [facility] not having bed alarms.  Patient's daughter stated that she "prefers to take her mother home."  She will be staying with her mother.  Dr. Ouida Sills called and notified.  Case Cytogeneticist notified.  Dr. Ouida Sills gave orders for home health at discharge and patient to discharge home.  Follow-up with Dr. Ouida Sills in one week.

## 2016-08-02 NOTE — Clinical Social Work Note (Signed)
LCSW notified Kerri at [facility] that patient was discharging.    LCSW notified patient's daughter of patinet's discharge.     LCSW sent clinicals via [employer].    LCSW signing off.

## 2016-08-02 NOTE — Discharge Summary (Signed)
Physician Discharge Summary  ARMINTA GAMM BMW:[id number] DOB: 10-13-1917 DOA: 07/29/2016   Admit date: 07/29/2016 Discharge date: 08/02/2016  Discharge Diagnoses:  Principal Problem:   Acute lower UTI Active Problems:   Atrial fibrillation (HCC)   Hypertension   History of CVA (cerebrovascular accident)   Chronic diastolic CHF (congestive heart failure) (HCC)   Gastroesophageal reflux disease   Arteriosclerotic cardiovascular disease (ASCVD)   UTI (urinary tract infection)   Bronchospasm, acute   Pain and swelling of left knee    Wt Readings from Last 3 Encounters:  08/02/16 182 lb 8 oz (82.8 kg)  10/18/15 171 lb 8 oz (77.8 kg)  08/10/14 167 lb 1.7 oz (75.8 kg)     Hospital Course:  This patient is a 83 year old female who presented with dysuria and left knee pain. Her urinalysis was consistent with UTI. She was started on ceftriaxone. Her white count was 9.4. Urine culture ultimately revealed Escherichia coli which was sensitive to cephalosporins. She had no fever.  She could not walk because of left knee pain. She'll large effusion which was aspirated by orthopedics. 80 ML's of cloudy fluid was obtained. Culture has been negative. No crystals were identified. She has a history of possible rheumatoid arthritis and will be treated further with prednisone.  She has had stable chronic systolic heart failure. She has stable chronic kidney disease with a BUN and creatinine of 31 and 1.56.  She has had a history of iron deficiency anemia previously evaluated fully. Hemoglobin has been in the 10 range. Iron is 13 with a ferritin of 42. Iron has been restarted.  She has a right apical opacity on chest x-ray which will be followed up as an outpatient.  She initially had mildly elevated troponins which were not felt to be consistent with acute coronary syndrome.  Her condition has improved. She will require SNF for rehabilitation prior to returning home.  Exam on day of discharge  reveals clear lungs. Heart is irregularly irregular. Abdomen is soft and nontender. Knee reveals no redness.  She'll be seen in follow-up at skilled care.  Please obtain CBC and metabolic profile in one week.   Discharge Instructions  Discharge Instructions    Diet - low sodium heart healthy    Complete by:  As directed    Increase activity slowly    Complete by:  As directed    Reason for NOT prescribing anticoagulant therapy at discharge    Complete by:  As directed    Reason for not prescribing antIcoagulant at discharge?:  Medical Contraindication     Allergies as of 08/02/2016      Reactions   Ciprofloxacin Rash      Medication List    TAKE these medications   aspirin EC 325 MG tablet Take 1 tablet (325 mg total) by mouth daily.   cephALEXin 500 MG capsule Commonly known as:  KEFLEX Take 1 capsule (500 mg total) by mouth 2 (two) times daily.   diltiazem 240 MG 24 hr capsule Commonly known as:  CARDIZEM CD Take 1 capsule (240 mg total) by mouth daily. What changed:  how much to take  when to take this   ferrous sulfate 325 (65 FE) MG tablet Take 1 tablet (325 mg total) by mouth 2 (two) times daily with a meal.   furosemide 20 MG tablet Commonly known as:  LASIX Take 20 mg by mouth every other day.   losartan 100 MG tablet Commonly known as:  COZAAR Take 100 mg  by mouth daily.   metoprolol 50 MG tablet Commonly known as:  LOPRESSOR Take 1 tablet (50 mg total) by mouth 2 (two) times daily. What changed:  how much to take   pantoprazole 40 MG tablet Commonly known as:  PROTONIX Take 1 tablet (40 mg total) by mouth daily.   polyethylene glycol packet Commonly known as:  MIRALAX / GLYCOLAX Take 17 g by mouth daily as needed.   predniSONE 5 MG tablet Commonly known as:  DELTASONE Take 1 tablet (5 mg total) by mouth daily with breakfast. What changed:  medication strength  how much to take  additional instructions        Johsua Shevlin  08/02/2016

## 2016-08-02 NOTE — Progress Notes (Addendum)
Patient has productive cough.  Sats 98 on RA.  Dr. Ouida Sills notified of patient's assessment.  Dr. Ouida Sills gave order for Robitussin DM prn.

## 2016-08-03 ENCOUNTER — Emergency Department (HOSPITAL_COMMUNITY): Payer: Medicare Other

## 2016-08-03 ENCOUNTER — Inpatient Hospital Stay (HOSPITAL_COMMUNITY)
Admission: EM | Admit: 2016-08-03 | Discharge: 2016-08-07 | DRG: 535 | Disposition: A | Discharge status: Hospice | Payer: Medicare Other | Attending: Internal Medicine | Admitting: Internal Medicine

## 2016-08-03 ENCOUNTER — Encounter (HOSPITAL_COMMUNITY): Payer: Self-pay | Admitting: *Deleted

## 2016-08-03 DIAGNOSIS — I272 Pulmonary hypertension, unspecified: Secondary | ICD-10-CM | POA: Diagnosis not present

## 2016-08-03 DIAGNOSIS — R0902 Hypoxemia: Secondary | ICD-10-CM

## 2016-08-03 DIAGNOSIS — N179 Acute kidney failure, unspecified: Secondary | ICD-10-CM | POA: Diagnosis not present

## 2016-08-03 DIAGNOSIS — N183 Chronic kidney disease, stage 3 unspecified: Secondary | ICD-10-CM

## 2016-08-03 DIAGNOSIS — S72001A Fracture of unspecified part of neck of right femur, initial encounter for closed fracture: Secondary | ICD-10-CM | POA: Diagnosis not present

## 2016-08-03 DIAGNOSIS — J9601 Acute respiratory failure with hypoxia: Secondary | ICD-10-CM | POA: Diagnosis not present

## 2016-08-03 DIAGNOSIS — Z8673 Personal history of transient ischemic attack (TIA), and cerebral infarction without residual deficits: Secondary | ICD-10-CM

## 2016-08-03 DIAGNOSIS — I214 Non-ST elevation (NSTEMI) myocardial infarction: Secondary | ICD-10-CM | POA: Diagnosis not present

## 2016-08-03 DIAGNOSIS — R7989 Other specified abnormal findings of blood chemistry: Secondary | ICD-10-CM | POA: Diagnosis present

## 2016-08-03 DIAGNOSIS — N3000 Acute cystitis without hematuria: Secondary | ICD-10-CM | POA: Diagnosis not present

## 2016-08-03 DIAGNOSIS — I509 Heart failure, unspecified: Secondary | ICD-10-CM | POA: Diagnosis not present

## 2016-08-03 DIAGNOSIS — K210 Gastro-esophageal reflux disease with esophagitis: Secondary | ICD-10-CM | POA: Diagnosis not present

## 2016-08-03 DIAGNOSIS — E875 Hyperkalemia: Secondary | ICD-10-CM | POA: Diagnosis not present

## 2016-08-03 DIAGNOSIS — Z7982 Long term (current) use of aspirin: Secondary | ICD-10-CM | POA: Diagnosis not present

## 2016-08-03 DIAGNOSIS — I4891 Unspecified atrial fibrillation: Secondary | ICD-10-CM | POA: Diagnosis present

## 2016-08-03 DIAGNOSIS — W06XXXA Fall from bed, initial encounter: Secondary | ICD-10-CM | POA: Diagnosis present

## 2016-08-03 DIAGNOSIS — I130 Hypertensive heart and chronic kidney disease with heart failure and stage 1 through stage 4 chronic kidney disease, or unspecified chronic kidney disease: Secondary | ICD-10-CM | POA: Diagnosis not present

## 2016-08-03 DIAGNOSIS — N3001 Acute cystitis with hematuria: Secondary | ICD-10-CM | POA: Diagnosis not present

## 2016-08-03 DIAGNOSIS — I10 Essential (primary) hypertension: Secondary | ICD-10-CM | POA: Diagnosis not present

## 2016-08-03 DIAGNOSIS — R778 Other specified abnormalities of plasma proteins: Secondary | ICD-10-CM | POA: Diagnosis present

## 2016-08-03 DIAGNOSIS — I2510 Atherosclerotic heart disease of native coronary artery without angina pectoris: Secondary | ICD-10-CM | POA: Diagnosis not present

## 2016-08-03 DIAGNOSIS — E860 Dehydration: Secondary | ICD-10-CM | POA: Diagnosis not present

## 2016-08-03 DIAGNOSIS — Z515 Encounter for palliative care: Secondary | ICD-10-CM | POA: Diagnosis not present

## 2016-08-03 DIAGNOSIS — G9341 Metabolic encephalopathy: Secondary | ICD-10-CM | POA: Diagnosis not present

## 2016-08-03 DIAGNOSIS — G9340 Encephalopathy, unspecified: Secondary | ICD-10-CM

## 2016-08-03 DIAGNOSIS — E0781 Sick-euthyroid syndrome: Secondary | ICD-10-CM | POA: Diagnosis present

## 2016-08-03 DIAGNOSIS — Z7189 Other specified counseling: Secondary | ICD-10-CM | POA: Diagnosis not present

## 2016-08-03 DIAGNOSIS — Z79899 Other long term (current) drug therapy: Secondary | ICD-10-CM | POA: Diagnosis not present

## 2016-08-03 DIAGNOSIS — I252 Old myocardial infarction: Secondary | ICD-10-CM

## 2016-08-03 DIAGNOSIS — I5032 Chronic diastolic (congestive) heart failure: Secondary | ICD-10-CM | POA: Diagnosis not present

## 2016-08-03 DIAGNOSIS — D509 Iron deficiency anemia, unspecified: Secondary | ICD-10-CM | POA: Diagnosis present

## 2016-08-03 DIAGNOSIS — J90 Pleural effusion, not elsewhere classified: Secondary | ICD-10-CM | POA: Diagnosis not present

## 2016-08-03 DIAGNOSIS — E785 Hyperlipidemia, unspecified: Secondary | ICD-10-CM | POA: Diagnosis not present

## 2016-08-03 DIAGNOSIS — R748 Abnormal levels of other serum enzymes: Secondary | ICD-10-CM | POA: Diagnosis not present

## 2016-08-03 DIAGNOSIS — K219 Gastro-esophageal reflux disease without esophagitis: Secondary | ICD-10-CM | POA: Diagnosis not present

## 2016-08-03 DIAGNOSIS — R079 Chest pain, unspecified: Secondary | ICD-10-CM | POA: Diagnosis not present

## 2016-08-03 DIAGNOSIS — N390 Urinary tract infection, site not specified: Secondary | ICD-10-CM | POA: Diagnosis not present

## 2016-08-03 DIAGNOSIS — Z8669 Personal history of other diseases of the nervous system and sense organs: Secondary | ICD-10-CM | POA: Diagnosis not present

## 2016-08-03 DIAGNOSIS — R531 Weakness: Secondary | ICD-10-CM | POA: Diagnosis not present

## 2016-08-03 DIAGNOSIS — I482 Chronic atrial fibrillation: Secondary | ICD-10-CM | POA: Diagnosis not present

## 2016-08-03 DIAGNOSIS — R52 Pain, unspecified: Secondary | ICD-10-CM

## 2016-08-03 DIAGNOSIS — J9600 Acute respiratory failure, unspecified whether with hypoxia or hypercapnia: Secondary | ICD-10-CM | POA: Diagnosis not present

## 2016-08-03 DIAGNOSIS — Z66 Do not resuscitate: Secondary | ICD-10-CM | POA: Diagnosis not present

## 2016-08-03 DIAGNOSIS — I671 Cerebral aneurysm, nonruptured: Secondary | ICD-10-CM | POA: Diagnosis present

## 2016-08-03 DIAGNOSIS — D649 Anemia, unspecified: Secondary | ICD-10-CM | POA: Diagnosis present

## 2016-08-03 DIAGNOSIS — Z881 Allergy status to other antibiotic agents status: Secondary | ICD-10-CM | POA: Diagnosis not present

## 2016-08-03 DIAGNOSIS — S7290XA Unspecified fracture of unspecified femur, initial encounter for closed fracture: Secondary | ICD-10-CM | POA: Diagnosis present

## 2016-08-03 DIAGNOSIS — R404 Transient alteration of awareness: Secondary | ICD-10-CM | POA: Diagnosis not present

## 2016-08-03 DIAGNOSIS — S72002K Fracture of unspecified part of neck of left femur, subsequent encounter for closed fracture with nonunion: Secondary | ICD-10-CM

## 2016-08-03 LAB — CBC WITH DIFFERENTIAL/PLATELET
Basophils Absolute: 0 10*3/uL (ref 0.0–0.1)
Basophils Relative: 0 %
EOS ABS: 0.1 10*3/uL (ref 0.0–0.7)
EOS PCT: 1 %
HCT: 37.7 % (ref 36.0–46.0)
Hemoglobin: 11.2 g/dL — ABNORMAL LOW (ref 12.0–15.0)
LYMPHS ABS: 0.4 10*3/uL — AB (ref 0.7–4.0)
Lymphocytes Relative: 4 %
MCH: 23 pg — AB (ref 26.0–34.0)
MCHC: 29.7 g/dL — AB (ref 30.0–36.0)
MCV: 77.3 fL — ABNORMAL LOW (ref 78.0–100.0)
MONO ABS: 0.7 10*3/uL (ref 0.1–1.0)
MONOS PCT: 8 %
Neutro Abs: 8.2 10*3/uL — ABNORMAL HIGH (ref 1.7–7.7)
Neutrophils Relative %: 87 %
PLATELETS: 259 10*3/uL (ref 150–400)
RBC: 4.88 MIL/uL (ref 3.87–5.11)
RDW: 17.3 % — ABNORMAL HIGH (ref 11.5–15.5)
WBC: 9.4 10*3/uL (ref 4.0–10.5)

## 2016-08-03 LAB — URINALYSIS, ROUTINE W REFLEX MICROSCOPIC
BILIRUBIN URINE: NEGATIVE
GLUCOSE, UA: NEGATIVE mg/dL
Ketones, ur: NEGATIVE mg/dL
LEUKOCYTES UA: NEGATIVE
NITRITE: NEGATIVE
Protein, ur: 100 mg/dL — AB
Specific Gravity, Urine: 1.016 (ref 1.005–1.030)
WBC, UA: NONE SEEN WBC/hpf (ref 0–5)
pH: 6 (ref 5.0–8.0)

## 2016-08-03 LAB — COMPREHENSIVE METABOLIC PANEL
ALK PHOS: 75 U/L (ref 38–126)
ALT: 19 U/L (ref 14–54)
ANION GAP: 11 (ref 5–15)
AST: 43 U/L — ABNORMAL HIGH (ref 15–41)
Albumin: 3.3 g/dL — ABNORMAL LOW (ref 3.5–5.0)
BUN: 27 mg/dL — ABNORMAL HIGH (ref 6–20)
CALCIUM: 9.2 mg/dL (ref 8.9–10.3)
CHLORIDE: 98 mmol/L — AB (ref 101–111)
CO2: 24 mmol/L (ref 22–32)
Creatinine, Ser: 1.41 mg/dL — ABNORMAL HIGH (ref 0.44–1.00)
GFR, EST AFRICAN AMERICAN: 35 mL/min — AB (ref >60)
GFR, EST NON AFRICAN AMERICAN: 30 mL/min — AB (ref >60)
Glucose, Bld: 140 mg/dL — ABNORMAL HIGH (ref 65–99)
Potassium: 5.3 mmol/L — ABNORMAL HIGH (ref 3.5–5.1)
SODIUM: 133 mmol/L — AB (ref 135–145)
Total Bilirubin: 0.8 mg/dL (ref 0.3–1.2)
Total Protein: 7.2 g/dL (ref 6.5–8.1)

## 2016-08-03 LAB — BODY FLUID CULTURE: Culture: NO GROWTH

## 2016-08-03 LAB — APTT: APTT: 20 s — AB (ref 24–36)

## 2016-08-03 LAB — MRSA PCR SCREENING: MRSA BY PCR: NEGATIVE

## 2016-08-03 LAB — PROTIME-INR
INR: 1.29
Prothrombin Time: 16.2 seconds — ABNORMAL HIGH (ref 11.4–15.2)

## 2016-08-03 LAB — TROPONIN I: TROPONIN I: 2.51 ng/mL — AB (ref <0.03)

## 2016-08-03 MED ORDER — PREDNISONE 5 MG PO TABS: 5.0000 mg | ORAL_TABLET | Freq: Every day | ORAL | Status: DC

## 2016-08-03 MED ORDER — HYDRALAZINE HCL 20 MG/ML IJ SOLN
10.0000 mg | INTRAMUSCULAR | Status: DC | PRN
  Administered 2016-08-03: 10 mg via INTRAVENOUS
  Filled 2016-08-03: qty 1

## 2016-08-03 MED ORDER — HYDROMORPHONE HCL 1 MG/ML IJ SOLN
0.5000 mg | INTRAMUSCULAR | Status: DC | PRN
  Administered 2016-08-04 – 2016-08-05 (×3): 0.5 mg via INTRAVENOUS
  Filled 2016-08-03 (×3): qty 0.5

## 2016-08-03 MED ORDER — SODIUM CHLORIDE 0.9 % IV BOLUS (SEPSIS)
1000.0000 mL | Freq: Once | INTRAVENOUS | Status: AC
  Administered 2016-08-03: 1000 mL via INTRAVENOUS

## 2016-08-03 MED ORDER — CEPHALEXIN 500 MG PO CAPS
500.0000 mg | ORAL_CAPSULE | Freq: Two times a day (BID) | ORAL | Status: DC
  Administered 2016-08-03: 500 mg via ORAL
  Filled 2016-08-03 (×2): qty 1

## 2016-08-03 MED ORDER — ACETAMINOPHEN 650 MG RE SUPP: 650.0000 mg | Freq: Four times a day (QID) | RECTAL | Status: DC | PRN

## 2016-08-03 MED ORDER — DILTIAZEM LOAD VIA INFUSION
15.0000 mg | Freq: Once | INTRAVENOUS | Status: AC
  Administered 2016-08-03: 15 mg via INTRAVENOUS
  Filled 2016-08-03: qty 15

## 2016-08-03 MED ORDER — ACETAMINOPHEN 325 MG PO TABS
650.0000 mg | ORAL_TABLET | Freq: Four times a day (QID) | ORAL | Status: DC | PRN
  Filled 2016-08-03: qty 2

## 2016-08-03 MED ORDER — DILTIAZEM HCL 25 MG/5ML IV SOLN: 10.0000 mg | Freq: Once | INTRAVENOUS | Status: DC

## 2016-08-03 MED ORDER — METOPROLOL TARTRATE 25 MG PO TABS
25.0000 mg | ORAL_TABLET | Freq: Once | ORAL | Status: AC
  Administered 2016-08-03: 25 mg via ORAL
  Filled 2016-08-03: qty 1

## 2016-08-03 MED ORDER — ONDANSETRON HCL 4 MG PO TABS: 4.0000 mg | ORAL_TABLET | Freq: Four times a day (QID) | ORAL | Status: DC | PRN

## 2016-08-03 MED ORDER — DILTIAZEM HCL ER COATED BEADS 120 MG PO CP24
120.0000 mg | ORAL_CAPSULE | Freq: Every day | ORAL | Status: DC
  Administered 2016-08-03: 120 mg via ORAL
  Filled 2016-08-03 (×4): qty 1

## 2016-08-03 MED ORDER — FUROSEMIDE 20 MG PO TABS: 20.0000 mg | ORAL_TABLET | ORAL | Status: DC

## 2016-08-03 MED ORDER — LOSARTAN POTASSIUM 50 MG PO TABS
100.0000 mg | ORAL_TABLET | Freq: Every day | ORAL | Status: DC
  Filled 2016-08-03: qty 2

## 2016-08-03 MED ORDER — HEPARIN (PORCINE) IN NACL 100-0.45 UNIT/ML-% IJ SOLN
950.0000 [IU]/h | INTRAMUSCULAR | Status: DC
  Filled 2016-08-03: qty 250

## 2016-08-03 MED ORDER — METOPROLOL TARTRATE 25 MG PO TABS: 25.0000 mg | ORAL_TABLET | Freq: Two times a day (BID) | ORAL | Status: DC

## 2016-08-03 MED ORDER — DILTIAZEM HCL ER COATED BEADS 120 MG PO CP24
ORAL_CAPSULE | ORAL | Status: AC
  Filled 2016-08-03: qty 1

## 2016-08-03 MED ORDER — DILTIAZEM HCL-DEXTROSE 100-5 MG/100ML-% IV SOLN (PREMIX)
5.0000 mg/h | INTRAVENOUS | Status: DC
Start: 2016-08-03 — End: 2016-08-06
  Administered 2016-08-03: 5 mg/h via INTRAVENOUS
  Administered 2016-08-04 – 2016-08-06 (×8): 15 mg/h via INTRAVENOUS
  Filled 2016-08-03 (×10): qty 100

## 2016-08-03 MED ORDER — DILTIAZEM HCL ER COATED BEADS 120 MG PO CP24: 240.0000 mg | ORAL_CAPSULE | Freq: Every day | ORAL | Status: DC

## 2016-08-03 MED ORDER — SODIUM CHLORIDE 0.9 % IV SOLN
INTRAVENOUS | Status: DC
  Administered 2016-08-03: 22:00:00 via INTRAVENOUS

## 2016-08-03 MED ORDER — HEPARIN BOLUS VIA INFUSION
4000.0000 [IU] | INTRAVENOUS | Status: DC
  Filled 2016-08-03: qty 4000

## 2016-08-03 MED ORDER — GUAIFENESIN-DM 100-10 MG/5ML PO SYRP: 10.0000 mL | ORAL_SOLUTION | Freq: Four times a day (QID) | ORAL | Status: DC | PRN

## 2016-08-03 MED ORDER — PANTOPRAZOLE SODIUM 40 MG PO TBEC: 40.0000 mg | DELAYED_RELEASE_TABLET | Freq: Every day | ORAL | Status: DC

## 2016-08-03 MED ORDER — ONDANSETRON HCL 4 MG/2ML IJ SOLN
4.0000 mg | Freq: Four times a day (QID) | INTRAMUSCULAR | Status: DC | PRN
  Filled 2016-08-03: qty 2

## 2016-08-03 MED ORDER — FERROUS SULFATE 325 (65 FE) MG PO TABS: 325.0000 mg | ORAL_TABLET | Freq: Two times a day (BID) | ORAL | Status: DC

## 2016-08-03 MED ORDER — POLYETHYLENE GLYCOL 3350 17 G PO PACK
17.0000 g | PACK | Freq: Every day | ORAL | Status: DC | PRN
  Administered 2016-08-07: 17 g via ORAL
  Filled 2016-08-03: qty 1

## 2016-08-03 NOTE — Progress Notes (Signed)
ANTICOAGULATION CONSULT NOTE - Initial Consult  Pharmacy Consult for IV heparin Indication: ACS  Allergies  Allergen Reactions  . Ciprofloxacin Rash    Patient Measurements: Height:  (175.3 cm) Weight: 179 lb 10.8 oz (81.5 kg) IBW/kg (Calculated) : 66.2  Heparin Dosing Weight: actual body weight  Vital Signs: Temp: 97.7 F (36.5 C) (04/27 2120) Temp Source: Oral (04/27 2120) BP: 168/107 (04/27 2000) Pulse Rate: 147 (04/27 1916)  Labs:  Recent Labs  08/01/16 0618 08/03/16 1533 08/03/16 1925  HGB  --  11.2*  --   HCT  --  37.7  --   PLT  --  259  --   CREATININE 1.56* 1.41*  --   TROPONINI  --   --  2.51*    Estimated Creatinine Clearance: 26.6 mL/min (A) (by C-G formula based on SCr of 1.41 mg/dL (H)).   Medical History: Past Medical History:  Diagnosis Date  . Arteriosclerotic cardiovascular disease (ASCVD)    H/o MI; cath in 2001:30-40% LAD, nl Cx, TO RCA with collaterals, EF-70% with inferior hypokinesis; repeat cath in 2002-unchanged; 2006: Neg stress nuclear  . Atrial fibrillation (HCC)    No anticoagulation  . Congestive heart failure (HCC)    06/2012 Echo: Moderate to severe LVH, nl EF, moderate LAE; mild to moderate pulmonary hypertension  . Diverticulitis    Extensive diverticulosis in 2006  . Gastroesophageal reflux disease   . Hyperlipidemia    Lipid profile 2010:112, 106, 58, 33  . Hypertension    Nl renal arteriography in 2001  . Ischemic colitis (HCC) 2006  . MI, old   . Nodule of right lung   . PONV (postoperative nausea and vomiting)   . 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 Syncope 2003, 2014   Attributed to UTI in 2003, acute gastroenteritis in 2014    Assessment: 55 y/oF with PMH of a-fib, stroke not on anticoagulation PTA who was recently discharged from [facility]. Patient presented back to [facility] today s/p fall at home.  Patient transferred to [facility] after she was found to have a R femur fracture. Troponin elevated at 2.51, so pharmacy consulted to start heparin infusion for this patient. CBC reveals Hgb 11.2, Pltc WNL. CrCl ~ 27 ml/min. Baseline aPTT, PT/INR pending.  Goal of Therapy:  Heparin level 0.3-0.7 units/ml Monitor platelets by anticoagulation protocol: Yes   Plan:  Heparin 4000 units IV bolus x 1, then start heparin infusion at 950 units/hr. Heparin level 8 hours after heparin infusion initiated. Daily heparin level and CBC while on heparin infusion. Monitor closely for s/sx of bleeding.   Greer Pickerel, PharmD, BCPS Pager: [phone] 08/03/2016 10:09 PM

## 2016-08-03 NOTE — ED Notes (Signed)
Report given to [facility] ICU RN.

## 2016-08-03 NOTE — H&P (Addendum)
History and Physical  [name]:[id number] DOB: [birth year] DOA: 08/03/2016  Referring physician: Drr Aileen Pilot, ED physician PCP: Carylon Perches, MD  Outpatient Specialists:    Patient Coming From: Home  Chief Complaint: Fall, hip pain  HPI: [name] is a 83 y.o. 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 Patient recently hospitalized due to UTI, on Keflex at home. Patient discharged yesterday morning. It was recommended that the patient go to skilled nursing facility, however this was declined by the patient family. At home, the patient tried to get out of the bed of the foot of the bed when she fell and landed on her right hip. Patient had immediate pain with difficulty weightbearing. Patient was evaluated at the hospital and was found to have a right femur fracture.  Emergency Department Course: X-rays show a right displaced femoral neck fracture. Patient has not taken medications this morning and has atrial fib ablation with rapid ventricular rate. Patient given IV December her home by mouth metoprolol.  Review of Systems:   Patient confused and unable to give review of systems.  Past Medical History:  Diagnosis Date  . Arteriosclerotic cardiovascular disease (ASCVD)    H/o MI; cath in 2001:30-40% LAD, nl Cx, TO RCA with collaterals, EF-70% with inferior hypokinesis; repeat cath in 2002-unchanged; 2006: Neg stress nuclear  . Atrial fibrillation (HCC)    No anticoagulation  . Congestive heart failure (HCC)    06/2012 Echo: Moderate to severe LVH, nl EF, moderate LAE; mild to moderate pulmonary hypertension  . Diverticulitis    Extensive diverticulosis in 2006  . Gastroesophageal reflux disease   . Hyperlipidemia    Lipid profile 2010:112, 106, 58, 33    . Hypertension    Nl renal arteriography in 2001  . Ischemic colitis (HCC) 2006  . MI, old   . Nodule of right lung   . PONV (postoperative nausea and vomiting)   . 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 Syncope 2003, 2014   Attributed to UTI in 2003, acute gastroenteritis in 2014   Past Surgical History:  Procedure Laterality Date  . COLONOSCOPY  2006   Dr. Karilyn Cota: ischemic colitis involving descending colon, extensive diverticula  . ESOPHAGOGASTRODUODENOSCOPY (EGD) WITH ESOPHAGEAL DILATION N/A 06/24/2012   Procedure: ESOPHAGOGASTRODUODENOSCOPY (EGD) WITH ESOPHAGEAL DILATION;  Surgeon: Corbin Ade, MD;  Location: [facility] ENDO SUITE;  Service: Endoscopy;  Laterality: N/A;  . KNEE SURGERY     Social History:  reports that she has never smoked. She has never used smokeless tobacco. She reports that she does not drink alcohol or use drugs. Patient lives at Home  Allergies  Allergen Reactions  . Ciprofloxacin Rash    Family History  Problem Relation Age of Onset  . Cancer Mother   . Cancer Sister   . Heart attack Father   . Colon cancer Neg Hx       Prior to Admission medications   Medication Sig Start Date End Date Taking? Authorizing Provider  aspirin EC 325 MG tablet Take 1 tablet (325 mg total) by mouth daily. 08/02/16  Yes Carylon Perches, MD  cephALEXin (KEFLEX) 500 MG capsule Take 1 capsule (500 mg total) by mouth 2 (two) times daily. 08/02/16  Yes Channing Mutters  Ouida Sills, MD  diltiazem (CARDIZEM CD) 240 MG 24 hr capsule Take 1 capsule (240 mg total) by mouth daily. Patient taking differently: Take 120 mg by mouth 2 (two) times daily.  07/08/12  Yes Carylon Perches, MD  ferrous sulfate 325 (65 FE) MG tablet Take 1 tablet (325 mg total) by mouth 2 (two) times daily with a meal. 08/02/16  Yes Carylon Perches, MD  furosemide (LASIX) 20 MG tablet Take 20 mg by mouth every other day.  06/20/12  Yes Historical Provider, MD   guaiFENesin-dextromethorphan (ROBITUSSIN DM) 100-10 MG/5ML syrup Take 10 mLs by mouth every 6 (six) hours as needed for cough. 08/02/16  Yes Carylon Perches, MD  losartan (COZAAR) 100 MG tablet Take 100 mg by mouth daily.  06/20/12  Yes Historical Provider, MD  metoprolol (LOPRESSOR) 50 MG tablet Take 1 tablet (50 mg total) by mouth 2 (two) times daily. Patient taking differently: Take 25 mg by mouth 2 (two) times daily.  07/08/12  Yes Carylon Perches, MD  pantoprazole (PROTONIX) 40 MG tablet Take 1 tablet (40 mg total) by mouth daily. 07/08/12  Yes Carylon Perches, MD  polyethylene glycol [facility] / GLYCOLAX) packet Take 17 g by mouth daily as needed.   Yes Historical Provider, MD  predniSONE (DELTASONE) 5 MG tablet Take 1 tablet (5 mg total) by mouth daily with breakfast. 08/02/16  Yes Carylon Perches, MD    Physical Exam: BP 140/76 (BP Location: Right Arm)   Pulse (!) 131   Temp 98.7 F (37.1 C) (Oral)   Resp (!) 26   SpO2 96%   General: Elderly Caucasian female. Awake and alert and oriented x3. No acute cardiopulmonary distress.  HEENT: Normocephalic atraumatic.  Right and left ears normal in appearance.  Pupils equal, round, reactive to light. Extraocular muscles are intact. Sclerae anicteric and noninjected.  Moist mucosal membranes. Many missing teeth. No mucosal lesions.  Neck: Neck supple without lymphadenopathy. No carotid bruits. No masses palpated.  Cardiovascular: Irregular rate with normal S1-S2 sounds. No murmurs, rubs, gallops auscultated. No JVD.  Respiratory: Good respiratory effort with no wheezes, rales, rhonchi. Lungs clear to auscultation bilaterally.  No accessory muscle use. Abdomen: Soft, nontender, nondistended. Active bowel sounds. No masses or hepatosplenomegaly  Skin: No rashes, lesions, or ulcerations.  Dry, warm to touch. 2+ dorsalis pedis and radial pulses. Musculoskeletal: No calf or leg pain. All major joints not erythematous nontender.  No upper or lower joint deformation.  Good ROM.   No contractures  Psychiatric: Intact judgment and insight. Pleasant and cooperative. Neurologic: No focal neurological deficits. Strength is 5/5 and symmetric in upper and lower extremities.  Cranial nerves II through XII are grossly intact.           Labs on Admission: I have personally reviewed following labs and imaging studies  CBC:  Recent Labs Lab 07/29/16 2156 08/03/16 1533  WBC 9.4 9.4  NEUTROABS 6.4 8.2*  HGB 10.3* 11.2*  HCT 33.5* 37.7  MCV 77.4* 77.3*  PLT 211 259   Basic Metabolic Panel:  Recent Labs Lab 07/29/16 2156 07/30/16 0437 08/01/16 0618 08/03/16 1533  NA 133* 134* 135 133*  K 3.6 3.4* 5.1 5.3*  CL 100* 100* 102 98*  CO2 GLUCOSE 133* 122* 101* 140*  BUN 19 20 31* 27*  CREATININE 1.38* 1.24* 1.56* 1.41*  CALCIUM 8.5* 8.1* 8.4* 9.2   GFR: Estimated Creatinine Clearance: 26.8 mL/min (A) (by C-G formula based on SCr of 1.41 mg/dL (H)). Liver Function  Tests:  Recent Labs Lab 08/03/16 1533  AST 43*  ALT 19  ALKPHOS 75  BILITOT 0.8  PROT 7.2  ALBUMIN 3.3*   No results for input(s): LIPASE, AMYLASE in the last 168 hours. No results for input(s): AMMONIA in the last 168 hours. Coagulation Profile: No results for input(s): INR, PROTIME in the last 168 hours. Cardiac Enzymes:  Recent Labs Lab 07/29/16 2156 07/30/16 0257 07/30/16 0832 07/30/16 1542  TROPONINI 0.28* 0.21* 0.16* 0.13*   BNP (last 3 results) No results for input(s): PROBNP in the last 8760 hours. HbA1C: No results for input(s): HGBA1C in the last 72 hours. CBG:  Recent Labs Lab 07/30/16 0741 07/31/16 0748 08/01/16 0747 08/02/16 0733  GLUCAP 113* 104* 96 96   Lipid Profile: No results for input(s): CHOL, HDL, LDLCALC, TRIG, CHOLHDL, LDLDIRECT in the last 72 hours. Thyroid Function Tests: No results for input(s): TSH, T4TOTAL, FREET4, T3FREE, THYROIDAB in the last 72 hours. Anemia Panel: No results for input(s): VITAMINB12, FOLATE, FERRITIN, TIBC,  IRON, RETICCTPCT in the last 72 hours. Urine analysis:    Component Value Date/Time   COLORURINE YELLOW 08/03/2016 1818   APPEARANCEUR CLEAR 08/03/2016 1818   LABSPEC 1.016 08/03/2016 1818   PHURINE 6.0 08/03/2016 1818   GLUCOSEU NEGATIVE 08/03/2016 1818   HGBUR SMALL (A) 08/03/2016 1818   BILIRUBINUR NEGATIVE 08/03/2016 1818   KETONESUR NEGATIVE 08/03/2016 1818   PROTEINUR 100 (A) 08/03/2016 1818   UROBILINOGEN 1.0 08/10/2014 1441   NITRITE NEGATIVE 08/03/2016 1818   LEUKOCYTESUR NEGATIVE 08/03/2016 1818   Sepsis Labs: (procalcitonin:4,lacticidven:4) ) Recent Results (from the past 240 hour(s))  Urine culture     Status: Abnormal   Collection Time: 07/29/16 10:30 PM  Result Value Ref Range Status   Specimen Description URINE, CATHETERIZED  Final   Special Requests NONE  Final   Culture >=100,000 COLONIES/mL ESCHERICHIA COLI (A)  Final   Report Status 08/01/2016 FINAL  Final   Organism ID, Bacteria ESCHERICHIA COLI (A)  Final      Susceptibility   Escherichia coli - MIC*    AMPICILLIN RESISTANT Resistant     CEFAZOLIN <=4 SENSITIVE Sensitive     CEFTRIAXONE <=1 SENSITIVE Sensitive     CIPROFLOXACIN <=0.25 SENSITIVE Sensitive     GENTAMICIN <=1 SENSITIVE Sensitive     IMIPENEM <=0.25 SENSITIVE Sensitive     NITROFURANTOIN <=16 SENSITIVE Sensitive     TRIMETH/SULFA <=20 SENSITIVE Sensitive     AMPICILLIN/SULBACTAM 4 SENSITIVE Sensitive     PIP/TAZO 32 INTERMEDIATE Intermediate     Extended ESBL NEGATIVE Sensitive     * >=100,000 COLONIES/mL ESCHERICHIA COLI  Body fluid culture     Status: None   Collection Time: 07/30/16  5:00 PM  Result Value Ref Range Status   Specimen Description SYNOVIAL LEFT KNEE  Final   Special Requests NONE  Final   Gram Stain   Final    CYTOSPIN SMEAR NO ORGANISMS SEEN WBC PRESENT, PREDOMINANTLY PMN    Culture   Final    NO GROWTH 3 DAYS Performed at [facility], [address] [place], [place] [address]    Report  Status 08/03/2016 FINAL  Final  Gram stain     Status: None   Collection Time: 07/30/16  5:00 PM  Result Value Ref Range Status   Specimen Description SYNOVIAL LEFT KNEE  Final   Special Requests NONE  Final   Gram Stain   Final    CYTOSPIN SMEAR NO ORGANISMS SEEN WBC PRESENT,  PREDOMINANTLY PMN    Report Status 07/30/2016 FINAL  Final     Radiological Exams on Admission: Dg Hip Unilat W Or Wo Pelvis 2-3 Views Right  Result Date: 08/03/2016 CLINICAL DATA:  Hip pain after fall.  Initial encounter. EXAM: DG HIP (WITH OR WITHOUT PELVIS) 2-3V RIGHT COMPARISON:  None. FINDINGS: Right femoral neck fracture, transcervical to basicervical. The right femoral head is located. No evidence of pelvic ring fracture. Osteopenia and atherosclerosis. Advanced lower lumbar disc degeneration. IMPRESSION: Displaced right femoral neck fracture. Electronically Signed   By: Marnee Spring M.D.   On: 08/03/2016 16:32    EKG: Independently reviewed. Atrial fibrillation with rapid ventricular rate.  Assessment/Plan: Principal Problem:   Femur fracture (HCC) Active Problems:   Atrial fibrillation (HCC)   Hypertension   Chronic diastolic CHF (congestive heart failure) (HCC)   Gastroesophageal reflux disease   UTI (urinary tract infection)    This patient was discussed with the ED physician, including pertinent vitals, physical exam findings, labs, and imaging.  We also discussed care given by the ED provider.  #1 femur fracture  Admit to stepdown [facility] for Ortho Evra class  Dilaudid for pain control  Nothing by mouth  No anticoagulation #2 atrial fibrillation with RVR  If patient's heart rate does not become under controlled with oral medications, we'll start drip. Will admit the patient to stepdown overnight for now #3 chronic diastolic heart failure  Appears compensated at the present time  Continue diuretics #4 GERD  Continue home regimen #5 UTI  Continue Keflex #6  hypertension    continue home medications #7 H/o elevated troponin  Repeat tonight to assure no ACS  DVT prophylaxis:  SCDs Consultants: Orthopnea  Code Status: DO NOT RESUSCITATE  Family Communication: son and daughter in the room  Disposition Plan: stepdown admission, consult social worker for rehabilitation placement   Levie Heritage, DO [facility] Pager [phone]  If 7PM-7AM, please contact night-coverage [email or web address] Password [email or web address]   Addendum: Patient left for [facility] hospital. Troponin elevated to 2.5, resulted after patient left for [facility] hospital. Will start heparin drip. Consult cardiology. Start cardizem drip if HR still elevated when arrives at [facility].  Levie Heritage, DO 08/03/2016 9:10 PM

## 2016-08-03 NOTE — ED Notes (Signed)
Pt's depend soaked with urine, changed at this time.

## 2016-08-03 NOTE — ED Provider Notes (Signed)
[facility]-EMERGENCY DEPT Provider Note   CSN: [id number] Arrival date & time: 08/03/16  1350     History   Chief Complaint Chief Complaint  Patient presents with  . Fall    HPI KRISTYANNA BARCELO is a 83 y.o. female.  Patient fell out of the bed today complains of pain in the right hip. Patient did not hit her head.   The history is provided by the patient and a relative. No language interpreter was used.  Fall  This is a new problem. The current episode started 3 to 5 hours ago. The problem occurs rarely. The problem has not changed since onset.Pertinent negatives include no chest pain, no abdominal pain and no headaches. Exacerbated by: Movement. Nothing relieves the symptoms.    Past Medical History:  Diagnosis Date  . Arteriosclerotic cardiovascular disease (ASCVD)    H/o MI; cath in 2001:30-40% LAD, nl Cx, TO RCA with collaterals, EF-70% with inferior hypokinesis; repeat cath in 2002-unchanged; 2006: Neg stress nuclear  . Atrial fibrillation (HCC)    No anticoagulation  . Congestive heart failure (HCC)    06/2012 Echo: Moderate to severe LVH, nl EF, moderate LAE; mild to moderate pulmonary hypertension  . Diverticulitis    Extensive diverticulosis in 2006  . Gastroesophageal reflux disease   . Hyperlipidemia    Lipid profile 2010:112, 106, 58, 33  . Hypertension    Nl renal arteriography in 2001  . Ischemic colitis (HCC) 2006  . MI, old   . Nodule of right lung   . PONV (postoperative nausea and vomiting)   . 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 Syncope 2003, 2014   Attributed to UTI in 2003, acute gastroenteritis in 2014    Patient Active Problem List   Diagnosis Date Noted  . Femur fracture (HCC) 08/03/2016  . Elevated troponin   . UTI (urinary tract infection) 07/29/2016  . Bronchospasm, acute 07/29/2016  . Pain and swelling of left knee 07/29/2016  . Left hip pain  08/10/2014  . Acute lower UTI 08/10/2014  . Hypertension   . History of CVA (cerebrovascular accident)   . Chronic diastolic CHF (congestive heart failure) (HCC)   . Gastroesophageal reflux disease   . Hyperlipidemia   . Arteriosclerotic cardiovascular disease (ASCVD)   . Atrial fibrillation (HCC) 07/04/2012  . Bradycardia 06/23/2012    Past Surgical History:  Procedure Laterality Date  . COLONOSCOPY  2006   Dr. Karilyn Cota: ischemic colitis involving descending colon, extensive diverticula  . ESOPHAGOGASTRODUODENOSCOPY (EGD) WITH ESOPHAGEAL DILATION N/A 06/24/2012   Procedure: ESOPHAGOGASTRODUODENOSCOPY (EGD) WITH ESOPHAGEAL DILATION;  Surgeon: Corbin Ade, MD;  Location: [facility] ENDO [facility]ITE;  Service: Endoscopy;  Laterality: N/A;  . KNEE SURGERY      OB History    No data available       Home Medications    Prior to Admission medications   Medication Sig Start Date End Date Taking? Authorizing Provider  aspirin EC 325 MG tablet Take 1 tablet (325 mg total) by mouth daily. 08/02/16  Yes Carylon Perches, MD  cephALEXin (KEFLEX) 500 MG capsule Take 1 capsule (500 mg total) by mouth 2 (two) times daily. 08/02/16  Yes Carylon Perches, MD  diltiazem (CARDIZEM CD) 240 MG 24 hr capsule Take 1 capsule (240 mg total) by mouth daily. Patient taking differently: Take 120 mg by mouth 2 (two) times daily.  07/08/12  Yes Carylon Perches, MD  ferrous  sulfate 325 (65 FE) MG tablet Take 1 tablet (325 mg total) by mouth 2 (two) times daily with a meal. 08/02/16  Yes Carylon Perches, MD  furosemide (LASIX) 20 MG tablet Take 20 mg by mouth every other day.  06/20/12  Yes Historical Provider, MD  guaiFENesin-dextromethorphan (ROBITUSSIN DM) 100-10 MG/5ML syrup Take 10 mLs by mouth every 6 (six) hours as needed for cough. 08/02/16  Yes Carylon Perches, MD  losartan (COZAAR) 100 MG tablet Take 100 mg by mouth daily.  06/20/12  Yes Historical Provider, MD  metoprolol (LOPRESSOR) 50 MG tablet Take 1 tablet (50 mg total) by mouth 2 (two) times  daily. Patient taking differently: Take 25 mg by mouth 2 (two) times daily.  07/08/12  Yes Carylon Perches, MD  pantoprazole (PROTONIX) 40 MG tablet Take 1 tablet (40 mg total) by mouth daily. 07/08/12  Yes Carylon Perches, MD  polyethylene glycol [facility] / GLYCOLAX) packet Take 17 g by mouth daily as needed.   Yes Historical Provider, MD  predniSONE (DELTASONE) 5 MG tablet Take 1 tablet (5 mg total) by mouth daily with breakfast. 08/02/16  Yes Carylon Perches, MD    Family History Family History  Problem Relation Age of Onset  . Cancer Mother   . Cancer Sister   . Heart attack Father   . Colon cancer Neg Hx     Social History Social History  Substance Use Topics  . Smoking status: Never Smoker  . Smokeless tobacco: Never Used  . Alcohol use No     Allergies   Ciprofloxacin   Review of Systems Review of Systems  Constitutional: Negative for appetite change and fatigue.  HENT: Negative for congestion, ear discharge and sinus pressure.   Eyes: Negative for discharge.  Respiratory: Negative for cough.   Cardiovascular: Negative for chest pain.  Gastrointestinal: Negative for abdominal pain and diarrhea.  Genitourinary: Negative for frequency and hematuria.  Musculoskeletal: Negative for back pain.       Right hip pain  Skin: Negative for rash.  Neurological: Negative for seizures and headaches.  Psychiatric/Behavioral: Negative for hallucinations.     Physical Exam Updated Vital Signs BP (!) 159/101   Pulse (!) 147   Temp 98.7 F (37.1 C) (Oral)   Resp (!) 26   SpO2 96%   Physical Exam  Constitutional: She is oriented to person, place, and time. She appears well-developed.  HENT:  Head: Normocephalic.  Eyes: Conjunctivae and EOM are normal. No scleral icterus.  Neck: Neck supple. No thyromegaly present.  Cardiovascular: Normal rate and regular rhythm.  Exam reveals no gallop and no friction rub.   No murmur heard. Pulmonary/Chest: No stridor. She has no wheezes. She has no  rales. She exhibits no tenderness.  Abdominal: She exhibits no distension. There is no tenderness. There is no rebound.  Musculoskeletal: Normal range of motion. She exhibits no edema.  Tender right hip. External rotation,   left knee tender from arthritis  Lymphadenopathy:    She has no cervical adenopathy.  Neurological: She is oriented to person, place, and time. She exhibits normal muscle tone. Coordination normal.  Skin: No rash noted. No erythema.  Psychiatric: She has a normal mood and affect. Her behavior is normal.     ED Treatments / Results  Labs (all labs ordered are listed, but only abnormal results are displayed) Labs Reviewed  CBC WITH DIFFERENTIAL/PLATELET - Abnormal; Notable for the following:       Result Value   Hemoglobin 11.2 (*)  MCV 77.3 (*)    MCH 23.0 (*)    MCHC 29.7 (*)    RDW 17.3 (*)    Neutro Abs 8.2 (*)    Lymphs Abs 0.4 (*)    All other components within normal limits  COMPREHENSIVE METABOLIC PANEL - Abnormal; Notable for the following:    Sodium 133 (*)    Potassium 5.3 (*)    Chloride 98 (*)    Glucose, Bld 140 (*)    BUN 27 (*)    Creatinine, Ser 1.41 (*)    Albumin 3.3 (*)    AST 43 (*)    GFR calc non Af Amer 30 (*)    GFR calc Af Amer 35 (*)    All other components within normal limits  URINALYSIS, ROUTINE W REFLEX MICROSCOPIC - Abnormal; Notable for the following:    Hgb urine dipstick SMALL (*)    Protein, ur 100 (*)    Bacteria, UA RARE (*)    Squamous Epithelial / LPF 0-5 (*)    All other components within normal limits  TROPONIN I    EKG  EKG Interpretation None       Radiology Dg Hip Unilat W Or Wo Pelvis 2-3 Views Right  Result Date: 08/03/2016 CLINICAL DATA:  Hip pain after fall.  Initial encounter. EXAM: DG HIP (WITH OR WITHOUT PELVIS) 2-3V RIGHT COMPARISON:  None. FINDINGS: Right femoral neck fracture, transcervical to basicervical. The right femoral head is located. No evidence of pelvic ring fracture.  Osteopenia and atherosclerosis. Advanced lower lumbar disc degeneration. IMPRESSION: Displaced right femoral neck fracture. Electronically Signed   By: Marnee Spring M.D.   On: 08/03/2016 16:32    Procedures Procedures (including critical care time)  Medications Ordered in ED Medications  diltiazem (CARDIZEM) injection 10 mg (not administered)  diltiazem (CARDIZEM CD) 24 hr capsule 120 mg (120 mg Oral Given 08/03/16 1938)  sodium chloride 0.9 % bolus 1,000 mL (0 mLs Intravenous Stopped 08/03/16 1941)  metoprolol tartrate (LOPRESSOR) tablet 25 mg (25 mg Oral Given 08/03/16 1916)     Initial Impression / Assessment and Plan / ED Course  I have reviewed the triage vital signs and the nursing notes.  Pertinent labs & imaging results that were available during my care of the patient were reviewed by me and considered in my medical decision making (see chart for details).     X-ray shows femoral neck fracture. Patient will be admitted to [facility] by internal medicine with orthopedic consult  Final Clinical Impressions(s) / ED Diagnoses   Final diagnoses:  Closed fracture of right hip, initial encounter [facility])    New Prescriptions New Prescriptions   No medications on file     Bethann Berkshire, MD 08/03/16 [phone]

## 2016-08-03 NOTE — ED Notes (Signed)
CRITICAL VALUE ALERT  Critical value received:  Troponin 2.51  Date of notification:  08/03/16  Time of notification:  2043  Critical value read back:Yes  Nurse who received alert:  Rory Percy   MD notified (1st page):  Adrian Blackwater  Time of first page:  2043  MD notified (2nd page):  Time of second page:  Responding MD:  Adrian Blackwater  Time MD responded:  2044

## 2016-08-03 NOTE — ED Notes (Signed)
AC called to bring cardizem.

## 2016-08-03 NOTE — Consult Note (Signed)
Reason for Consult: Troponin   Requesting Physician/Service: [facility]   PCP:  [name], MD Primary Cardiologist:[name]  HPI:  83 year old with permenant afib not on AC, CAD (CTO RCA no PCI), CVA, HTN, dCHF.  She was recently discharged and recommended for SNF but family was fearful of sending her to SNF without bed rails or bed alarms and she was brought home.  They found her down, presumably after a fall from her bed and was brought to OSH where she was in Afib with RVR.  No chest pain, SOB.  Found to have hip fracture and sent to [facility] for further treatment. In route, Troponin added on and ~2.5.   Discussed with them her overall wishes regarding invasive procedures and they noted that should probably wouldn't want a cath as she when of more sound mind refused it in the past.  She is conversant bu not oriented.  Denies chest pain, notes hip pain.   Currently rate controlled on dilt drip.   Previous Cardiac Studies:  Cardiac Catheterization 06/1999([name]) FINDINGS:  1. LEFT MAIN: Normal.  2. LEFT ANTERIOR DESCENDING: Has 30% tubular lesion proximally. The distal vessel had a 40% tubular lesion; these were unchanged from the study done  in 1999. The first diagonal branch was normal.  3. CIRCUMFLEX CORONARY ARTERY: A large vessel, normal. There were two obtuse marginal branches which were entirely normal. There were some left-to-right  collaterals to the PDA.  4. RIGHT CORONARY ARTERY: Known to be previously occluded. It was 100% occluded  proximally, with some bridging collaterals to the distal vessel.  RAO VENTRICULOGRAPHY: Revealed inferior apical wall hypokinesis. Ejection fraction is preserved with a 70% range.  HEMODYNAMICS:  1. Aortic pressure: 168/82.  2. Left ventricular pressure: 168/18.  The patient was somewhat more hypertensive than this during the case, and was given  nitroglycerin sublingually. The aortic root injections done due to the  patients pain between the  scapula. There was no evidence of aortic dissection.  AORTOGRAM: Since she had significant hypertension, we did a distal aortogram looking at her renal arteries. These were widely patent.  Echocardiogram: 2010 Left ventricle: The cavity size was normal. Wall thickness was increased in a pattern of mild LVH. Systolic function was normal. The estimated ejection fraction was in the range of 55% to 60%. Wall motion was normal; there were no regional wall motion abnormalities. Doppler parameters are consistent with abnormal left ventricular relaxation (grade 1 diastolic dysfunction). - Mitral valve: Calcified annulus. Mild regurgitation. - Left atrium: The atrium was moderately dilated. - Right atrium: The atrium was mildly dilated. - Pulmonary arteries: Systolic pressure was mildly to moderately increased. PA peak pressure: 20m Hg (S). - Pericardium, extracardiac: A trivial pericardial effusion was identified.  Past Medical History:  Diagnosis Date  . Arteriosclerotic cardiovascular disease (ASCVD)    H/o MI; cath in 2001:30-40% LAD, nl Cx, TO RCA with collaterals, EF-70% with inferior hypokinesis; repeat cath in 2002-unchanged; 2006: Neg stress nuclear  . Atrial fibrillation (HCC)    No anticoagulation  . [place]    [place] Echo: Moderate to severe LVH, nl EF, moderate LAE; mild to moderate pulmonary hypertension  . Diverticulitis    Extensive diverticulosis in 2006  . Gastroesophageal reflux disease   . Hyperlipidemia    Lipid profile 2010:112, 106, 58, 33  . Hypertension    Nl renal arteriography in 2001  . [place]  . MI, old   . Nodule of  right lung   . PONV (postoperative nausea and vomiting)   . Stroke [facility]) 2000 and   2010: Left MCA; negative carotid ultrasound; supraclinoid aneurysm bilaterally; presumed embolic from AF  . Syncope 2003, 2014   Attributed to UTI in 2003, acute gastroenteritis in 2014    Past Surgical History:    Procedure Laterality Date  . COLONOSCOPY  2006   Dr. Laural Golden: ischemic colitis involving descending colon, extensive diverticula  . ESOPHAGOGASTRODUODENOSCOPY (EGD) WITH ESOPHAGEAL DILATION N/A 06/24/2012   Procedure: ESOPHAGOGASTRODUODENOSCOPY (EGD) WITH ESOPHAGEAL DILATION;  Surgeon: Daneil Dolin, MD;  Location: [facility];  Service: Endoscopy;  Laterality: N/A;  . KNEE SURGERY      Family History  Problem Relation Age of Onset  . Cancer Mother   . Cancer Sister   . Heart attack Father   . Colon cancer Neg Hx    Social History:  reports that she has never smoked. She has never used smokeless tobacco. She reports that she does not drink alcohol or use drugs.  Allergies:  Allergies  Allergen Reactions  . Ciprofloxacin Rash    No current facility-administered medications on file prior to encounter.    Current Outpatient Prescriptions on File Prior to Encounter  Medication Sig Dispense Refill  . aspirin EC 325 MG tablet Take 1 tablet (325 mg total) by mouth daily. 30 tablet 0  . cephALEXin (KEFLEX) 500 MG capsule Take 1 capsule (500 mg total) by mouth 2 (two) times daily. 14 capsule 0  . diltiazem (CARDIZEM CD) 240 MG 24 hr capsule Take 1 capsule (240 mg total) by mouth daily. (Patient taking differently: Take 120 mg by mouth 2 (two) times daily. ) 30 capsule 12  . ferrous sulfate 325 (65 FE) MG tablet Take 1 tablet (325 mg total) by mouth 2 (two) times daily with a meal. 60 tablet 3  . furosemide (LASIX) 20 MG tablet Take 20 mg by mouth every other day.     Marland Kitchen guaiFENesin-dextromethorphan (ROBITUSSIN DM) 100-10 MG/5ML syrup Take 10 mLs by mouth every 6 (six) hours as needed for cough. 118 mL 0  . losartan (COZAAR) 100 MG tablet Take 100 mg by mouth daily.     . metoprolol (LOPRESSOR) 50 MG tablet Take 1 tablet (50 mg total) by mouth 2 (two) times daily. (Patient taking differently: Take 25 mg by mouth 2 (two) times daily. ) 60 tablet 12  . pantoprazole (PROTONIX) 40 MG tablet  Take 1 tablet (40 mg total) by mouth daily. 30 tablet 12  . polyethylene glycol (MIRALAX / GLYCOLAX) packet Take 17 g by mouth daily as needed.    . predniSONE (DELTASONE) 5 MG tablet Take 1 tablet (5 mg total) by mouth daily with breakfast. 30 tablet 3    Results for orders placed or performed during the hospital encounter of 08/03/16 (from the past 48 hour(s))  CBC with Differential/Platelet     Status: Abnormal   Collection Time: 08/03/16  3:33 PM  Result Value Ref Range   WBC 9.4 4.0 - 10.5 K/uL   RBC 4.88 3.87 - 5.11 MIL/uL   Hemoglobin 11.2 (L) 12.0 - 15.0 g/dL   HCT 37.7 36.0 - 46.0 %   MCV 77.3 (L) 78.0 - 100.0 fL   MCH 23.0 (L) 26.0 - 34.0 pg   MCHC 29.7 (L) 30.0 - 36.0 g/dL   RDW 17.3 (H) 11.5 - 15.5 %   Platelets 259 150 - 400 K/uL   Neutrophils Relative % 87 %  Neutro Abs 8.2 (H) 1.7 - 7.7 K/uL   Lymphocytes Relative 4 %   Lymphs Abs 0.4 (L) 0.7 - 4.0 K/uL   Monocytes Relative 8 %   Monocytes Absolute 0.7 0.1 - 1.0 K/uL   Eosinophils Relative 1 %   Eosinophils Absolute 0.1 0.0 - 0.7 K/uL   Basophils Relative 0 %   Basophils Absolute 0.0 0.0 - 0.1 K/uL  Comprehensive metabolic panel     Status: Abnormal   Collection Time: 08/03/16  3:33 PM  Result Value Ref Range   Sodium 133 (L) 135 - 145 mmol/L   Potassium 5.3 (H) 3.5 - 5.1 mmol/L   Chloride 98 (L) 101 - 111 mmol/L   CO2 24 22 - 32 mmol/L   Glucose, Bld 140 (H) 65 - 99 mg/dL   BUN 27 (H) 6 - 20 mg/dL   Creatinine, Ser 1.41 (H) 0.44 - 1.00 mg/dL   Calcium 9.2 8.9 - 10.3 mg/dL   Total Protein 7.2 6.5 - 8.1 g/dL   Albumin 3.3 (L) 3.5 - 5.0 g/dL   AST 43 (H) 15 - 41 U/L   ALT 19 14 - 54 U/L   Alkaline Phosphatase 75 38 - 126 U/L   Total Bilirubin 0.8 0.3 - 1.2 mg/dL   GFR calc non Af Amer 30 (L) >60 mL/min   GFR calc Af Amer 35 (L) >60 mL/min    Comment: (NOTE) The eGFR has been calculated using the CKD EPI equation. This calculation has not been validated in all clinical situations. eGFR's persistently <60  mL/min signify possible Chronic Kidney Disease.    Anion gap 11 5 - 15  Urinalysis, Routine w reflex microscopic     Status: Abnormal   Collection Time: 08/03/16  6:18 PM  Result Value Ref Range   Color, Urine YELLOW YELLOW   APPearance CLEAR CLEAR   Specific Gravity, Urine 1.016 1.005 - 1.030   pH 6.0 5.0 - 8.0   Glucose, UA NEGATIVE NEGATIVE mg/dL   Hgb urine dipstick SMALL (A) NEGATIVE   Bilirubin Urine NEGATIVE NEGATIVE   Ketones, ur NEGATIVE NEGATIVE mg/dL   Protein, ur 100 (A) NEGATIVE mg/dL   Nitrite NEGATIVE NEGATIVE   Leukocytes, UA NEGATIVE NEGATIVE   RBC / HPF 0-5 0 - 5 RBC/hpf   WBC, UA NONE SEEN 0 - 5 WBC/hpf   Bacteria, UA RARE (A) NONE SEEN   Squamous Epithelial / LPF 0-5 (A) NONE SEEN   Mucous PRESENT   Troponin I     Status: Abnormal   Collection Time: 08/03/16  7:25 PM  Result Value Ref Range   Troponin I 2.51 (HH) <0.03 ng/mL    Comment: CRITICAL RESULT CALLED TO, READ BACK BY AND VERIFIED WITH:  LASHLEY,S @ 2043 ON 08/03/16 BY JUW    Dg Hip Unilat W Or Wo Pelvis 2-3 Views Right  Result Date: 08/03/2016 CLINICAL DATA:  Hip pain after fall.  Initial encounter. EXAM: DG HIP (WITH OR WITHOUT PELVIS) 2-3V RIGHT COMPARISON:  None. FINDINGS: Right femoral neck fracture, transcervical to basicervical. The right femoral head is located. No evidence of pelvic ring fracture. Osteopenia and atherosclerosis. Advanced lower lumbar disc degeneration. IMPRESSION: Displaced right femoral neck fracture. Electronically Signed   By: Monte Fantasia M.D.   On: 08/03/2016 16:32    ECG/TELE: Afib with RVR LAFB non sepcific ST T wave changes.    ROS: As above. Otherwise, review of systems is negative unless per above HPI  Vitals:   08/03/16 2000 08/03/16  2120 08/03/16 2200 08/03/16 2204  BP: (!) 168/107     Pulse:   (!) 142   Resp: (!) 23  (!) 26   Temp:  97.7 F (36.5 C)    TempSrc:  Oral    SpO2:   99%   Weight:    81.5 kg (179 lb 10.8 oz)  Height:    5' 9"  (1.753 m)    Wt Readings from Last 10 Encounters:  08/03/16 81.5 kg (179 lb 10.8 oz)  08/02/16 82.8 kg (182 lb 8 oz)  10/18/15 77.8 kg (171 lb 8 oz)  08/10/14 75.8 kg (167 lb 1.7 oz)  07/22/12 80.7 kg (178 lb)  07/14/12 84.8 kg (187 lb)  07/08/12 83 kg (182 lb 15.7 oz)  06/27/12 82.6 kg (182 lb 1.6 oz)    PE:  General: No acute distress HEENT: Atraumatic, EOMI, mucous membranes moist. No JVD at 45 degrees. No HJR. CV: tachy irr irr no murmurs, gallops.  Respiratory: Clear, no crackles. Normal work of breathing ABD: Non-distended and non-tender. No palpable organomegaly.  Extremities: 2+ radial pulses bilaterally. 1+ bilateral lower extremity edema. Neuro/Psych: CN grossly intact, alert and oriented  Assessment/Plan  Troponin elevation - Favor demand mediated from Afib with RVR, decreased clearance from CKD, and known CTO RCA and collateralized territory.  No chest pain.  Would continue to trend troponin.  Recommend 325 ASA if not already given and 81 mg daily.  Likely OK to hold heparin at this time given recent hip fracture and unlikely acute coronary syndrome.  Family feels that patient would want a conservative approach to invasive tests.    Fall - Sounds mechanical, no concern for syncope  Afib - Permanent, not anticoagulation candidate per previous notes.  Currently rate controlled on diltiazem drip.  As pain gets better controlled, would switch back to home oral medications. Note: patient previously developed heart block while on digoxin, would avoid.    Pre operative risk assessment - No formal plan at this time has been made; regardless, for a cardiac assessment would first see what overnight trend in troponin is before able to assess CV risk.  Continue to monitor.    Dawn Cram Memorie Yokoyama  MD 08/03/2016, 11:12 PM

## 2016-08-03 NOTE — ED Triage Notes (Signed)
Discharged from hospital yesterday, fell at home last night. Per EMS, PAIN IN RIGHT KNEE AND RIGHT HIP

## 2016-08-04 ENCOUNTER — Inpatient Hospital Stay (HOSPITAL_COMMUNITY): Payer: Medicare Other

## 2016-08-04 DIAGNOSIS — S72002K Fracture of unspecified part of neck of left femur, subsequent encounter for closed fracture with nonunion: Secondary | ICD-10-CM

## 2016-08-04 DIAGNOSIS — G9341 Metabolic encephalopathy: Secondary | ICD-10-CM

## 2016-08-04 DIAGNOSIS — E875 Hyperkalemia: Secondary | ICD-10-CM

## 2016-08-04 DIAGNOSIS — N3000 Acute cystitis without hematuria: Secondary | ICD-10-CM

## 2016-08-04 DIAGNOSIS — R748 Abnormal levels of other serum enzymes: Secondary | ICD-10-CM

## 2016-08-04 DIAGNOSIS — I482 Chronic atrial fibrillation: Secondary | ICD-10-CM

## 2016-08-04 DIAGNOSIS — G9340 Encephalopathy, unspecified: Secondary | ICD-10-CM

## 2016-08-04 LAB — MAGNESIUM: Magnesium: 2.1 mg/dL (ref 1.7–2.4)

## 2016-08-04 LAB — TROPONIN I
Troponin I: 10.19 ng/mL (ref <0.03)
Troponin I: 12.53 ng/mL (ref <0.03)

## 2016-08-04 LAB — BASIC METABOLIC PANEL
Anion gap: 12 (ref 5–15)
BUN: 24 mg/dL — ABNORMAL HIGH (ref 6–20)
CHLORIDE: 102 mmol/L (ref 101–111)
CO2: 22 mmol/L (ref 22–32)
CREATININE: 1.36 mg/dL — AB (ref 0.44–1.00)
Calcium: 8.9 mg/dL (ref 8.9–10.3)
GFR calc non Af Amer: 32 mL/min — ABNORMAL LOW (ref >60)
GFR, EST AFRICAN AMERICAN: 37 mL/min — AB (ref >60)
Glucose, Bld: 137 mg/dL — ABNORMAL HIGH (ref 65–99)
Potassium: 5.2 mmol/L — ABNORMAL HIGH (ref 3.5–5.1)
Sodium: 136 mmol/L (ref 135–145)

## 2016-08-04 LAB — T4, FREE: Free T4: 1.29 ng/dL — ABNORMAL HIGH (ref 0.61–1.12)

## 2016-08-04 LAB — AMMONIA: AMMONIA: 16 umol/L (ref 9–35)

## 2016-08-04 LAB — VITAMIN B12: VITAMIN B 12: 551 pg/mL (ref 180–914)

## 2016-08-04 LAB — TSH: TSH: 3.272 u[IU]/mL (ref 0.350–4.500)

## 2016-08-04 MED ORDER — SODIUM CHLORIDE 0.9 % IV SOLN: INTRAVENOUS | Status: DC

## 2016-08-04 MED ORDER — METOPROLOL TARTRATE 5 MG/5ML IV SOLN
2.5000 mg | Freq: Four times a day (QID) | INTRAVENOUS | Status: AC
  Administered 2016-08-04 – 2016-08-06 (×10): 2.5 mg via INTRAVENOUS
  Filled 2016-08-04 (×10): qty 5

## 2016-08-04 MED ORDER — FUROSEMIDE 10 MG/ML IJ SOLN
20.0000 mg | Freq: Once | INTRAMUSCULAR | Status: AC
  Administered 2016-08-04: 20 mg via INTRAVENOUS
  Filled 2016-08-04: qty 2

## 2016-08-04 MED ORDER — HALOPERIDOL LACTATE 5 MG/ML IJ SOLN
2.0000 mg | Freq: Four times a day (QID) | INTRAMUSCULAR | Status: DC | PRN
  Administered 2016-08-04 – 2016-08-05 (×2): 2 mg via INTRAVENOUS
  Filled 2016-08-04 (×3): qty 1

## 2016-08-04 MED ORDER — SODIUM CHLORIDE 0.9 % IV SOLN
INTRAVENOUS | Status: AC
  Administered 2016-08-04: 11:00:00 via INTRAVENOUS
  Filled 2016-08-04 (×2): qty 1000

## 2016-08-04 MED ORDER — PANTOPRAZOLE SODIUM 40 MG IV SOLR
40.0000 mg | INTRAVENOUS | Status: DC
  Administered 2016-08-04 – 2016-08-07 (×4): 40 mg via INTRAVENOUS
  Filled 2016-08-04 (×5): qty 40

## 2016-08-04 MED ORDER — DEXTROSE 5 % IV SOLN
1.0000 g | INTRAVENOUS | Status: AC
  Administered 2016-08-04 – 2016-08-05 (×2): 1 g via INTRAVENOUS
  Filled 2016-08-04 (×2): qty 10

## 2016-08-04 MED ORDER — SODIUM CHLORIDE 0.9 % IV SOLN
INTRAVENOUS | Status: DC
  Administered 2016-08-07: 09:00:00 via INTRAVENOUS

## 2016-08-04 NOTE — Consult Note (Signed)
Reason for Consult:  Displaced right femoral neck fracture Referring Physician:  ED Physician  [name] is an 83 y.o. female.  HPI: [name] is a 83 y.o. female with a recent hospitalized due to UTI, on Keflex at home. Patient discharged on 08/02/2016 to home.  On 08/03/16 the patient was left resting in the bed at home, family though the was well protected in the bed, but to their surprise she worked her way off the end of the bed.  When asked why she was getting out of bed by the family, they styate she said she was chasing rats.  When found she was complaining of right hip pain and subsequently brought to [facility].  X-rays were take which revealed a displaced right femoral neck fracture and orthopaedics was consulted.  It was also noted that the patient Troponin was elevated to 2.5 and Cardiology was consulted as well.    Past Medical History:  Diagnosis Date  . Arteriosclerotic cardiovascular disease (ASCVD)    H/o MI; cath in 2001:30-40% LAD, nl Cx, TO RCA with collaterals, EF-70% with inferior hypokinesis; repeat cath in 2002-unchanged; 2006: Neg stress nuclear  . Atrial fibrillation (HCC)    No anticoagulation  . Congestive heart failure ([place])    06/2012 Echo: Moderate to severe LVH, nl EF, moderate LAE; mild to moderate pulmonary hypertension  . Diverticulitis    Extensive diverticulosis in 2006  . Gastroesophageal reflux disease   . Hyperlipidemia    Lipid profile 2010:112, 106, 58, 33  . Hypertension    Nl renal arteriography in 2001  . Ischemic colitis ([place]) 2006  . MI, old   . Nodule of right lung   . PONV (postoperative nausea and vomiting)   . Stroke [facility]) 2000 and   2010: Left MCA; negative carotid ultrasound; supraclinoid aneurysm bilaterally; presumed embolic from AF  . Syncope 2003, 2014   Attributed to UTI in 2003, acute gastroenteritis in 2014    Past Surgical History:  Procedure Laterality Date  . COLONOSCOPY  2006   Dr. Laural Golden: ischemic  colitis involving descending colon, extensive diverticula  . ESOPHAGOGASTRODUODENOSCOPY (EGD) WITH ESOPHAGEAL DILATION N/A 06/24/2012   Procedure: ESOPHAGOGASTRODUODENOSCOPY (EGD) WITH ESOPHAGEAL DILATION;  Surgeon: Daneil Dolin, MD;  Location[facility];  Service: Endoscopy;  Laterality: N/A;  . KNEE SURGERY      Family History  Problem Relation Age of Onset  . Cancer Mother   . Cancer Sister   . Heart attack Father   . Colon cancer Neg Hx     Social History:  reports that she has never smoked. She has never used smokeless tobacco. She reports that she does not drink alcohol or use drugs.  Allergies:  Allergies  Allergen Reactions  . Ciprofloxacin Rash      Results for orders placed or performed during the hospital encounter of 08/03/16 (from the past 48 hour(s))  CBC with Differential/Platelet     Status: Abnormal   Collection Time: 08/03/16  3:33 PM  Result Value Ref Range   WBC 9.4 4.0 - 10.5 K/uL   RBC 4.88 3.87 - 5.11 MIL/uL   Hemoglobin 11.2 (L) 12.0 - 15.0 g/dL   HCT 37.7 36.0 - 46.0 %   MCV 77.3 (L) 78.0 - 100.0 fL   MCH 23.0 (L) 26.0 - 34.0 pg   MCHC 29.7 (L) 30.0 - 36.0 g/dL   RDW 17.3 (H) 11.5 - 15.5 %   Platelets 259 150 - 400 K/uL  Neutrophils Relative % 87 %   Neutro Abs 8.2 (H) 1.7 - 7.7 K/uL   Lymphocytes Relative 4 %   Lymphs Abs 0.4 (L) 0.7 - 4.0 K/uL   Monocytes Relative 8 %   Monocytes Absolute 0.7 0.1 - 1.0 K/uL   Eosinophils Relative 1 %   Eosinophils Absolute 0.1 0.0 - 0.7 K/uL   Basophils Relative 0 %   Basophils Absolute 0.0 0.0 - 0.1 K/uL  Comprehensive metabolic panel     Status: Abnormal   Collection Time: 08/03/16  3:33 PM  Result Value Ref Range   Sodium 133 (L) 135 - 145 mmol/L   Potassium 5.3 (H) 3.5 - 5.1 mmol/L   Chloride 98 (L) 101 - 111 mmol/L   CO2 24 22 - 32 mmol/L   Glucose, Bld 140 (H) 65 - 99 mg/dL   BUN 27 (H) 6 - 20 mg/dL   Creatinine, Ser 1.41 (H) 0.44 - 1.00 mg/dL   Calcium 9.2 8.9 - 10.3 mg/dL   Total  Protein 7.2 6.5 - 8.1 g/dL   Albumin 3.3 (L) 3.5 - 5.0 g/dL   AST 43 (H) 15 - 41 U/L   ALT 19 14 - 54 U/L   Alkaline Phosphatase 75 38 - 126 U/L   Total Bilirubin 0.8 0.3 - 1.2 mg/dL   GFR calc non Af Amer 30 (L) >60 mL/min   GFR calc Af Amer 35 (L) >60 mL/min    Comment: (NOTE) The eGFR has been calculated using the CKD EPI equation. This calculation has not been validated in all clinical situations. eGFR's persistently <60 mL/min signify possible Chronic Kidney Disease.    Anion gap 11 5 - 15  Urinalysis, Routine w reflex microscopic     Status: Abnormal   Collection Time: 08/03/16  6:18 PM  Result Value Ref Range   Color, Urine YELLOW YELLOW   APPearance CLEAR CLEAR   Specific Gravity, Urine 1.016 1.005 - 1.030   pH 6.0 5.0 - 8.0   Glucose, UA NEGATIVE NEGATIVE mg/dL   Hgb urine dipstick SMALL (A) NEGATIVE   Bilirubin Urine NEGATIVE NEGATIVE   Ketones, ur NEGATIVE NEGATIVE mg/dL   Protein, ur 100 (A) NEGATIVE mg/dL   Nitrite NEGATIVE NEGATIVE   Leukocytes, UA NEGATIVE NEGATIVE   RBC / HPF 0-5 0 - 5 RBC/hpf   WBC, UA NONE SEEN 0 - 5 WBC/hpf   Bacteria, UA RARE (A) NONE SEEN   Squamous Epithelial / LPF 0-5 (A) NONE SEEN   Mucous PRESENT   Troponin I     Status: Abnormal   Collection Time: 08/03/16  7:25 PM  Result Value Ref Range   Troponin I 2.51 (HH) <0.03 ng/mL    Comment: CRITICAL RESULT CALLED TO, READ BACK BY AND VERIFIED WITH:  LASHLEY,S @ 2043 ON 08/03/16 BY JUW   MRSA PCR Screening     Status: None   Collection Time: 08/03/16  9:48 PM  Result Value Ref Range   MRSA by PCR NEGATIVE NEGATIVE    Comment:        The GeneXpert MRSA Assay (FDA approved for NASAL specimens only), is one component of a comprehensive MRSA colonization surveillance program. It is not intended to diagnose MRSA infection nor to guide or monitor treatment for MRSA infections.   APTT     Status: Abnormal   Collection Time: 08/03/16 10:41 PM  Result Value Ref Range   aPTT 20 (L)  24 - 36 seconds  Protime-INR     Status:  Abnormal   Collection Time: 08/03/16 10:41 PM  Result Value Ref Range   Prothrombin Time 16.2 (H) 11.4 - 15.2 seconds   INR 7.10   Basic metabolic panel     Status: Abnormal   Collection Time: 08/04/16  4:01 AM  Result Value Ref Range   Sodium 136 135 - 145 mmol/L   Potassium 5.2 (H) 3.5 - 5.1 mmol/L   Chloride 102 101 - 111 mmol/L   CO2 22 22 - 32 mmol/L   Glucose, Bld 137 (H) 65 - 99 mg/dL   BUN 24 (H) 6 - 20 mg/dL   Creatinine, Ser 1.36 (H) 0.44 - 1.00 mg/dL   Calcium 8.9 8.9 - 10.3 mg/dL   GFR calc non Af Amer 32 (L) >60 mL/min   GFR calc Af Amer 37 (L) >60 mL/min    Comment: (NOTE) The eGFR has been calculated using the CKD EPI equation. This calculation has not been validated in all clinical situations. eGFR's persistently <60 mL/min signify possible Chronic Kidney Disease.    Anion gap 12 5 - 15  Magnesium     Status: None   Collection Time: 08/04/16  4:01 AM  Result Value Ref Range   Magnesium 2.1 1.7 - 2.4 mg/dL    Dg Hip Unilat W Or Wo Pelvis 2-3 Views Right  Result Date: 08/03/2016 CLINICAL DATA:  Hip pain after fall.  Initial encounter. EXAM: DG HIP (WITH OR WITHOUT PELVIS) 2-3V RIGHT COMPARISON:  None. FINDINGS: Right femoral neck fracture, transcervical to basicervical. The right femoral head is located. No evidence of pelvic ring fracture. Osteopenia and atherosclerosis. Advanced lower lumbar disc degeneration. IMPRESSION: Displaced right femoral neck fracture. Electronically Signed   By: Monte Fantasia M.D.   On: 08/03/2016 16:32    Review of Systems  Unable to perform ROS: Other (Patient confused and unable to give review of systems.)  Musculoskeletal: Positive for joint pain.   Blood pressure (!) 151/112, pulse (!) 153, temperature 97.4 F (36.3 C), temperature source Oral, resp. rate (!) 24, height 5' 9"  (1.753 m), weight 81.5 kg (179 lb 10.8 oz), SpO2 93 %. Physical Exam  Constitutional: She appears  well-developed and well-nourished.  HENT:  Head: Normocephalic.  Cardiovascular: Intact distal pulses.   Respiratory: Effort normal. No respiratory distress.  GI: Soft. There is no tenderness. There is no guarding.  Musculoskeletal:       Right hip: She exhibits decreased range of motion, decreased strength, tenderness, bony tenderness and swelling (eccymosis as well).  Skin: Skin is warm and dry.    Assessment/Plan: Displaced right femoral neck fracture    I had a discussion with the family the patient condition.  The are concerned that patient remains in significant pain even on medications and feel that something needs to be done.   They wish to proceed with a right hip hemiarthroplasty for pain control.  Risks, benefits and expectations were discussed with the patient's family.  Risks including but not limited to the risk of anesthesia, blood clots, nerve damage, blood vessel damage, failure of the prosthesis, infection and up to and including death.  Patient's family understand the risks, benefits and expectations and wishes to proceed with surgery.    At this time we Dr. Alvan Dame would be ready to proceed pending Cardiology and Medical clearances.   Please call Dr. Alvan Dame @ [phone], with updates in her cardio and medical conditions.    Expressed to the family we would like to proceed sooner than later to help  control pain, but want to make sure she is good to proceed with surgery.      Pricilla Loveless 08/04/2016, 8:06 AM

## 2016-08-04 NOTE — Progress Notes (Signed)
CRITICAL VALUE ALERT  Critical value received:  Troponin 12.53  Date of notification:  08/04/16  Time of notification:  1746  Critical value read back:No. did not call due to being a troponin.  Nurse who received alert:  Jannet Askew, RN  MD notified (1st page):  Lisabeth Devoid  Time of first page:  1910  MD notified (2nd page):  Time of second page:  Responding MD:    Time MD responded:

## 2016-08-04 NOTE — Progress Notes (Addendum)
Notified by nurse, patient's trop went up to 10, discussed with Dr. Johney Frame. Given lack of chest discomfort and her advanced age, would not recommend invasive study at this time. Will discuss with Dr. Johney Frame to see if he wants an echo.  Ramond Dial PA Pager: [phone]  Addendum: Discussed with Dr. Johney Frame, plan for echocardiogram

## 2016-08-04 NOTE — Progress Notes (Signed)
PROGRESS NOTE  [name]:[id number] DOB: 20-Apr-1917 DOA: 08/03/2016 PCP: Carylon Perches, MD  Brief History:  83 year old female with a history of permanent atrial fibrillation, diastolic CHF, hypertension, coronary artery disease, stroke, iron deficiency anemia, hyperlipidemia presented after a mechanical fall at home. The patient was recently hospitalized from 07/29/2016 through 08/02/2016 for left knee pain and joint effusion as well as UTI. The patient was discharged home in stable condition with cephalexin. For her left knee effusion, arthrocentesis was performed and showed WBC 9405 with negative cultures and negative crystals. It was felt this was likely due to osteoarthritis. According to the family, the patient developed delirium and confusion during her last hospitalization. She remained confused after discharge home. As result, the patient fell trying to get out of bed resulting in a right displaced femoral neck fracture. The patient was initially sent to the ED at [facility]. She was subsequently transferred to [facility] because she developed atrial fibrillation with RVR and elevated troponin up to 2.51. Orthopedics and cardiology were consulted to assist with management. She was started on a diltiazem drip with gradual improvement of her heart rate.  Assessment/Plan: Atrial fibrillation with RVR -Appreciate cardiology consult--> high risk for surgery, but no further interventions will alter the risk -Patient has a history of permanent atrial fibrillation -Not a candidate for anticoagulation secondary to fall risk as documented in previous medical record -Convert metoprolol IV Lopressor until patient is able to take po safely -TSH, Free T4  Elevated troponin -Felt to be demand ischemia from Afib RVR and CKD -EKG--atrial fibrillation with nonspecific ST-T wave changes -no chest pain   Displaced right femoral neck fracture -Appreciate orthopedics consult -As the  patient was previously able to ambulate, family favors surgical repair if possible despite high risk -pain control  Chronic diastolic CHF -Appears clinically euvolemic -Daily weights -Judicious IV fluid -Continue metoprolol  Acute metabolic encephalopathy -Multifactorial including opioids, hospital delirium, dehydration -B12 -TSH -Free T4 -ammonia -RPR -CT brain  Hyperkalemia -Discontinue losartan for now  CKD stage 3 -baseline creatinine 1.0-1.3 -dehydration resulting in elevation to 1.56 -improving with IVF  Essential hypertension -Continue Lopressor and diltiazem IV -Switched to po once able to tolerate by mouth safely  Coronary artery disease with history of MI -known CTO RCA and collateralized territory. -no chest pain -EKG without concerning ischemic changes -continue medical management  Iron Deficiency Anemia -restart po iron once safe to take po  Hypoxia -CXR -suspect hypoventilation    Disposition Plan:   Remain in stepdown  Family Communication:   Son and daughter update at bedside--Total time spent 40 minutes.  Greater than 50% spent face to face counseling and coordinating care.   Consultants:  Cardiology, [facility]  Code Status:  FULL   DVT Prophylaxis:  [place] Heparin    Procedures: As Listed in Progress Note Above  Antibiotics: Ceftriaxone 4/27>>>    Subjective: Patient is confused and intermittently agitated. Denies any chest pain or shortness of breath. No reports of vomiting, diarrhea, uncontrolled pain. Remainder of review of systems unattainable secondary to confusion  Objective: Vitals:   08/04/16 0300 08/04/16 0400 08/04/16 0600 08/04/16 0800  BP:  (!) 145/51 (!) 151/112   Pulse: (!) 110 (!) 121 (!) 153   Resp: (!) 21 18 (!) 24   Temp:    98.2 F (36.8 C)  TempSrc:    Oral  SpO2: 93% 95% 93%   Weight:  Height:        Intake/Output Summary (Last 24 hours) at 08/04/16 0927 Last data filed at 08/04/16 0440  Gross  per 24 hour  Intake             1010 ml  Output              500 ml  Net              510 ml   Weight change:  Exam:   General:  Pt is alert, Intermittently confused, not in acute distress  HEENT: No icterus, No thrush, No neck mass, [place]/AT  Cardiovascular: RRR, S1/S2, no rubs, no gallops  Respiratory: Fine bibasilar crackles. No wheezing. Good air movement.  Abdomen: Soft/+BS, non tender, non distended, no guarding  Extremities: No edema, No lymphangitis, No petechiae, No rashes, no synovitis   Data Reviewed: I have personally reviewed following labs and imaging studies Basic Metabolic Panel:  Recent Labs Lab 07/29/16 2156 07/30/16 0437 08/01/16 0618 08/03/16 1533 08/04/16 0401  NA 133* 134* 135 133* 136  K 3.6 3.4* 5.1 5.3* 5.2*  CL 100* 100* 102 98* 102  CO2 GLUCOSE 133* 122* 101* 140* 137*  BUN 19 20 31* 27* 24*  CREATININE 1.38* 1.24* 1.56* 1.41* 1.36*  CALCIUM 8.5* 8.1* 8.4* 9.2 8.9  MG  --   --   --   --  2.1   Liver Function Tests:  Recent Labs Lab 08/03/16 1533  AST 43*  ALT 19  ALKPHOS 75  BILITOT 0.8  PROT 7.2  ALBUMIN 3.3*   No results for input(s): LIPASE, AMYLASE in the last 168 hours. No results for input(s): AMMONIA in the last 168 hours. Coagulation Profile:  Recent Labs Lab 08/03/16 2241  INR 1.29   CBC:  Recent Labs Lab 07/29/16 2156 08/03/16 1533  WBC 9.4 9.4  NEUTROABS 6.4 8.2*  HGB 10.3* 11.2*  HCT 33.5* 37.7  MCV 77.4* 77.3*  PLT 211 259   Cardiac Enzymes:  Recent Labs Lab 07/29/16 2156 07/30/16 0257 07/30/16 0832 07/30/16 1542 08/03/16 1925  TROPONINI 0.28* 0.21* 0.16* 0.13* 2.51*   BNP: Invalid input(s): POCBNP CBG:  Recent Labs Lab 07/30/16 0741 07/31/16 0748 08/01/16 0747 08/02/16 0733  GLUCAP 113* 104* 96 96   HbA1C: No results for input(s): HGBA1C in the last 72 hours. Urine analysis:    Component Value Date/Time   COLORURINE YELLOW 08/03/2016 1818   APPEARANCEUR CLEAR  08/03/2016 1818   LABSPEC 1.016 08/03/2016 1818   PHURINE 6.0 08/03/2016 1818   GLUCOSEU NEGATIVE 08/03/2016 1818   HGBUR SMALL (A) 08/03/2016 1818   BILIRUBINUR NEGATIVE 08/03/2016 1818   KETONESUR NEGATIVE 08/03/2016 1818   PROTEINUR 100 (A) 08/03/2016 1818   UROBILINOGEN 1.0 08/10/2014 1441   NITRITE NEGATIVE 08/03/2016 1818   LEUKOCYTESUR NEGATIVE 08/03/2016 1818   Sepsis Labs: (procalcitonin:4,lacticidven:4) ) Recent Results (from the past 240 hour(s))  Urine culture     Status: Abnormal   Collection Time: 07/29/16 10:30 PM  Result Value Ref Range Status   Specimen Description URINE, CATHETERIZED  Final   Special Requests NONE  Final   Culture >=100,000 COLONIES/mL ESCHERICHIA COLI (A)  Final   Report Status 08/01/2016 FINAL  Final   Organism ID, Bacteria ESCHERICHIA COLI (A)  Final      Susceptibility   Escherichia coli - MIC*    AMPICILLIN RESISTANT Resistant     CEFAZOLIN <=4 SENSITIVE Sensitive     CEFTRIAXONE <=  1 SENSITIVE Sensitive     CIPROFLOXACIN <=0.25 SENSITIVE Sensitive     GENTAMICIN <=1 SENSITIVE Sensitive     IMIPENEM <=0.25 SENSITIVE Sensitive     NITROFURANTOIN <=16 SENSITIVE Sensitive     TRIMETH/SULFA <=20 SENSITIVE Sensitive     AMPICILLIN/SULBACTAM 4 SENSITIVE Sensitive     PIP/TAZO 32 INTERMEDIATE Intermediate     Extended ESBL NEGATIVE Sensitive     * >=100,000 COLONIES/mL ESCHERICHIA COLI  Body fluid culture     Status: None   Collection Time: 07/30/16  5:00 PM  Result Value Ref Range Status   Specimen Description SYNOVIAL LEFT KNEE  Final   Special Requests NONE  Final   Gram Stain   Final    CYTOSPIN SMEAR NO ORGANISMS SEEN WBC PRESENT, PREDOMINANTLY PMN    Culture   Final    NO GROWTH 3 DAYS Performed at [facility], [address] [place], [place] [address]    Report Status 08/03/2016 FINAL  Final  Gram stain     Status: None   Collection Time: 07/30/16  5:00 PM  Result Value Ref Range Status   Specimen  Description SYNOVIAL LEFT KNEE  Final   Special Requests NONE  Final   Gram Stain   Final    CYTOSPIN SMEAR NO ORGANISMS SEEN WBC PRESENT, PREDOMINANTLY PMN    Report Status 07/30/2016 FINAL  Final  MRSA PCR Screening     Status: None   Collection Time: 08/03/16  9:48 PM  Result Value Ref Range Status   MRSA by PCR NEGATIVE NEGATIVE Final    Comment:        The GeneXpert MRSA Assay (FDA approved for NASAL specimens only), is one component of a comprehensive MRSA colonization surveillance program. It is not intended to diagnose MRSA infection nor to guide or monitor treatment for MRSA infections.      Scheduled Meds: . cephALEXin  500 mg Oral BID  . diltiazem  10 mg Intravenous Once  . ferrous sulfate  325 mg Oral BID WC  . [START ON 08/05/2016] furosemide  20 mg Oral Q48H  . losartan  100 mg Oral Daily  . metoprolol  25 mg Oral BID  . pantoprazole  40 mg Oral Daily  . predniSONE  5 mg Oral Q breakfast   Continuous Infusions: . sodium chloride    . diltiazem (CARDIZEM) infusion 15 mg/hr (08/04/16 0650)    Procedures/Studies: Dg Knee 1-2 Views Left  Result Date: 07/30/2016 CLINICAL DATA:  83 year old female with bilateral lower extremity swelling and left knee pain. EXAM: LEFT KNEE - 1-2 VIEW COMPARISON:  Left knee radiograph dated 08/12/2014 FINDINGS: There is no acute fracture or dislocation. The bones are osteopenic. There is severe osteoarthritic changes of the knee with tricompartmental narrowing most severely involving the lateral compartment. There is a moderate size suprapatellar effusion. There is vascular atherosclerotic disease. IMPRESSION: 1. No acute fracture or dislocation. 2. Severe osteoarthritis. 3. Moderate size suprapatellar effusion. Electronically Signed   By: Elgie Collard M.D.   On: 07/30/2016 01:04   Dg Chest Port 1 View  Result Date: 07/29/2016 CLINICAL DATA:  Shortness of breast started 3 days ago. EXAM: PORTABLE CHEST 1 VIEW COMPARISON:   07/14/2012 FINDINGS: The cardio pericardial silhouette is enlarged. There is pulmonary vascular congestion without overt pulmonary edema. Stable opacity medial right apex. Degenerative changes left shoulder. Telemetry leads overlie the chest. IMPRESSION: 1. Cardiomegaly with vascular congestion. 2. Right apical opacity. Follow-up dedicated PA and lateral chest x-ray,  after resolution of acute symptoms, recommended to further evaluate. Electronically Signed   By: Kennith Center M.D.   On: 07/29/2016 22:02   Dg Hip Unilat W Or Wo Pelvis 2-3 Views Right  Result Date: 08/03/2016 CLINICAL DATA:  Hip pain after fall.  Initial encounter. EXAM: DG HIP (WITH OR WITHOUT PELVIS) 2-3V RIGHT COMPARISON:  None. FINDINGS: Right femoral neck fracture, transcervical to basicervical. The right femoral head is located. No evidence of pelvic ring fracture. Osteopenia and atherosclerosis. Advanced lower lumbar disc degeneration. IMPRESSION: Displaced right femoral neck fracture. Electronically Signed   By: Marnee Spring M.D.   On: 08/03/2016 16:32    Gunther Zawadzki, DO  [facility] Pager [phone]  If 7PM-7AM, please contact night-coverage [email or web address] Password [email or web address] 08/04/2016, 9:27 AM   LOS: 1 day

## 2016-08-04 NOTE — Progress Notes (Signed)
SUBJECTIVE: The patient is confused, complains of pain  . cephALEXin  500 mg Oral BID  . diltiazem  10 mg Intravenous Once  . ferrous sulfate  325 mg Oral BID WC  . [START ON 08/05/2016] furosemide  20 mg Oral Q48H  . losartan  100 mg Oral Daily  . metoprolol  25 mg Oral BID  . pantoprazole  40 mg Oral Daily  . predniSONE  5 mg Oral Q breakfast   . sodium chloride    . diltiazem (CARDIZEM) infusion 15 mg/hr (08/04/16 0650)    OBJECTIVE: Physical Exam: Vitals:   08/04/16 0300 08/04/16 0400 08/04/16 0600 08/04/16 0800  BP:  (!) 145/51 (!) 151/112   Pulse: (!) 110 (!) 121 (!) 153   Resp: (!) 21 18 (!) 24   Temp:    98.2 F (36.8 C)  TempSrc:    Oral  SpO2: 93% 95% 93%   Weight:      Height:        Intake/Output Summary (Last 24 hours) at 08/04/16 0905 Last data filed at 08/04/16 0440  Gross per 24 hour  Intake             1010 ml  Output              500 ml  Net              510 ml    Telemetry reveals afib, V rates 110s  GEN- The patient is ill, confused, wearing mittons Head- normocephalic, atraumatic Eyes-  Sclera clear, conjunctiva pink Ears- hearing intact Oropharynx- clear Neck- supple  Lungs- Clear to ausculation bilaterally, normal work of breathing Heart- irregular rate and rhythm, 2/6 high pitched murmur along LSB GI- soft, NT, ND, + BS Extremities- no clubbing, cyanosis, or edema Skin- no rash or lesion Psych- confused  LABS: Basic Metabolic Panel:  Recent Labs  16/10/96 1533 08/04/16 0401  NA 133* 136  K 5.3* 5.2*  CL 98* 102  CO2 24 22  GLUCOSE 140* 137*  BUN 27* 24*  CREATININE 1.41* 1.36*  CALCIUM 9.2 8.9  MG  --  2.1   Liver Function Tests:  Recent Labs  08/03/16 1533  AST 43*  ALT 19  ALKPHOS 75  BILITOT 0.8  PROT 7.2  ALBUMIN 3.3*   No results for input(s): LIPASE, AMYLASE in the last 72 hours. CBC:  Recent Labs  08/03/16 1533  WBC 9.4  NEUTROABS 8.2*  HGB 11.2*  HCT 37.7  MCV 77.3*  PLT 259   ASSESSMENT  AND PLAN:   Troponin elevation Demand ischemia from Afib with RVR, decreased clearance from CKD, and known CTO RCA and collateralized territory.  No chest pain. No ischemic changes on ekg.  Medical management is advised.  No plans for further CV testing.  Fall - Sounds mechanical, no concern for syncope  Afib - Permanent, not anticoagulation candidate per previous notes.  Currently rate controlled on diltiazem drip.  As pain gets better controlled, would switch back to home oral medications. Note: patient previously developed heart block while on digoxin, would avoid.     Pre operative risk assessment Given advanced age, delerium, CAD, and afib, she is at high risk for any procedures.  I do not feel that any further CV testing would reduce her risks.  I had a long discussion with family.  They are aware that risks are increased.  If medically necessary proceed with surgery if necessary.  Cardiology to follow with you  Hillis Range, MD 08/04/2016 9:05 AM

## 2016-08-04 NOTE — Progress Notes (Signed)
Notified about findings on CT brain  Case reviewed with Neurology, Dr. Amada Jupiter and neurosurgery [facility] and Dr. Bevely Palmer). No acute neurosurgical indication at this time.  Neurosurgery would defer any anticoagulation decisions to neurology.   Neurologic exam does not reveal new or worsening RUE or RLE weakness or new focal deficits.  In the setting of no new neurological deficits and established poor candidacy for anticoagulation (even with regard to Afib), neurology would not recommend any further work up at this time.  DTat

## 2016-08-04 NOTE — Progress Notes (Signed)
Patient went into VTach unsustained, not able to capture 12 lead. Patient asymptomatic. [facility] paged. [facility] put in STAT mag level. Will continue to monitor patient.

## 2016-08-05 DIAGNOSIS — Z7189 Other specified counseling: Secondary | ICD-10-CM

## 2016-08-05 DIAGNOSIS — Z515 Encounter for palliative care: Secondary | ICD-10-CM

## 2016-08-05 DIAGNOSIS — N390 Urinary tract infection, site not specified: Secondary | ICD-10-CM

## 2016-08-05 DIAGNOSIS — I214 Non-ST elevation (NSTEMI) myocardial infarction: Secondary | ICD-10-CM

## 2016-08-05 LAB — CBC
HCT: 32.5 % — ABNORMAL LOW (ref 36.0–46.0)
Hemoglobin: 9.7 g/dL — ABNORMAL LOW (ref 12.0–15.0)
MCH: 22.8 pg — AB (ref 26.0–34.0)
MCHC: 29.8 g/dL — AB (ref 30.0–36.0)
MCV: 76.5 fL — ABNORMAL LOW (ref 78.0–100.0)
PLATELETS: 258 10*3/uL (ref 150–400)
RBC: 4.25 MIL/uL (ref 3.87–5.11)
RDW: 16.9 % — ABNORMAL HIGH (ref 11.5–15.5)
WBC: 10.7 10*3/uL — AB (ref 4.0–10.5)

## 2016-08-05 LAB — BASIC METABOLIC PANEL
Anion gap: 9 (ref 5–15)
BUN: 27 mg/dL — AB (ref 6–20)
CHLORIDE: 106 mmol/L (ref 101–111)
CO2: 20 mmol/L — ABNORMAL LOW (ref 22–32)
Calcium: 8 mg/dL — ABNORMAL LOW (ref 8.9–10.3)
Creatinine, Ser: 1.52 mg/dL — ABNORMAL HIGH (ref 0.44–1.00)
GFR calc Af Amer: 32 mL/min — ABNORMAL LOW (ref >60)
GFR calc non Af Amer: 28 mL/min — ABNORMAL LOW (ref >60)
Glucose, Bld: 131 mg/dL — ABNORMAL HIGH (ref 65–99)
POTASSIUM: 4.2 mmol/L (ref 3.5–5.1)
SODIUM: 135 mmol/L (ref 135–145)

## 2016-08-05 LAB — RPR: RPR Ser Ql: NONREACTIVE

## 2016-08-05 MED ORDER — OXYCODONE HCL 5 MG PO TABS
5.0000 mg | ORAL_TABLET | Freq: Four times a day (QID) | ORAL | Status: DC
  Administered 2016-08-05 – 2016-08-07 (×6): 5 mg via ORAL
  Filled 2016-08-05 (×6): qty 1

## 2016-08-05 MED ORDER — CHLORHEXIDINE GLUCONATE 0.12 % MT SOLN
15.0000 mL | Freq: Two times a day (BID) | OROMUCOSAL | Status: DC
  Administered 2016-08-05 – 2016-08-06 (×3): 15 mL via OROMUCOSAL
  Filled 2016-08-05 (×3): qty 15

## 2016-08-05 MED ORDER — ORAL CARE MOUTH RINSE
15.0000 mL | Freq: Two times a day (BID) | OROMUCOSAL | Status: DC
  Administered 2016-08-06 (×3): 15 mL via OROMUCOSAL

## 2016-08-05 MED ORDER — HEPARIN SODIUM (PORCINE) 5000 UNIT/ML IJ SOLN
5000.0000 [IU] | Freq: Three times a day (TID) | INTRAMUSCULAR | Status: DC
  Administered 2016-08-05 – 2016-08-07 (×5): 5000 [IU] via SUBCUTANEOUS
  Filled 2016-08-05 (×5): qty 1

## 2016-08-05 MED ORDER — HYDROMORPHONE HCL 1 MG/ML IJ SOLN
0.5000 mg | INTRAMUSCULAR | Status: DC | PRN
  Administered 2016-08-05 (×3): 1 mg via INTRAVENOUS
  Administered 2016-08-06: 0.5 mg via INTRAVENOUS
  Filled 2016-08-05 (×3): qty 1
  Filled 2016-08-05: qty 0.5

## 2016-08-05 MED ORDER — ACETAMINOPHEN 325 MG PO TABS
650.0000 mg | ORAL_TABLET | Freq: Four times a day (QID) | ORAL | Status: DC
  Administered 2016-08-05 – 2016-08-07 (×6): 650 mg via ORAL
  Filled 2016-08-05 (×6): qty 2

## 2016-08-05 NOTE — Progress Notes (Addendum)
PROGRESS NOTE  [name] WUJ:[id number] DOB: 24-Nov-1917 DOA: 08/03/2016 PCP: Carylon Perches, MD Brief History:  83 year old female with a history of permanent atrial fibrillation, diastolic CHF, hypertension, coronary artery disease, stroke, iron deficiency anemia, hyperlipidemia presented after a mechanical fall at home. The patient was recently hospitalized from 07/29/2016 through 08/02/2016 for left knee pain and joint effusion as well as UTI. The patient was discharged home in stable condition with cephalexin. For her left knee effusion, arthrocentesis was performed and showed WBC 9405 with negative cultures and negative crystals. It was felt this was likely due to osteoarthritis. According to the family, the patient developed delirium and confusion during her last hospitalization. She remained confused after discharge home. As result, the patient fell trying to get out of bed resulting in a right displaced femoral neck fracture. The patient was initially sent to the ED at [facility]. She was subsequently transferred to [facility] because she developed atrial fibrillation with RVR and elevated troponin up to 2.51. Orthopedics and cardiology were consulted to assist with management. She was started on a diltiazem drip with gradual improvement of her heart rate.  Assessment/Plan: Atrial fibrillation with RVR -Appreciate cardiology consult--> high risk for surgery, but no further interventions will alter the risk -Patient has a history of permanent atrial fibrillation -Not a candidate for anticoagulation secondary to fall risk as documented in previous medical record -Convert metoprolol IV Lopressor until patient is able to take po safely -TSH, Free T4--consistent with euthyroid sick syndrome  Elevated troponin/NSTEMI -troponin peaked at 12.53 -EKG--atrial fibrillation with nonspecific ST-T wave changes -Case discussed with cardiology, Dr. Lucilla Edin is high risk for  surgery -known CTO RCA and collateralized territory.  Displaced right femoral neck fracture -Appreciate orthopedics consult -Case discussed with cardiology, Dr. Lucilla Edin is high risk for surgery -agree with palliative medicine consultation -pain control  Chronic diastolic CHF -Appears clinically euvolemic -Daily weights -Judicious IV fluid -Continue metoprolol  Acute metabolic encephalopathy -Multifactorial including opioids, hospital delirium, dehydration -B12--551 -TSH--3.272 -Free T4--1.29 -ammonia--16 -RPR--NR -CT brain--known right and left supraclinoid aneurysms unchanged diameter. Hyperdense appearance of aneurysm; otherwise no acute changes -08/05/16--more alert  -speech therapy evaluation  Hyperkalemia -Discontinue losartan -improved  CKD stage 3 -baseline creatinine 1.0-1.3 -am BMP  Essential hypertension -Continue Lopressor and diltiazem IV -Switched to po once able to tolerate by mouth safely  Iron Deficiency Anemia -restart po iron once safe to take po  Hypoxia -CXR--personally reviewed mild vascular prominence -Lasix 20 mg IV 1 given -stable on 3 L [place] -also a degree of hypoventilation  Goals of Care -pt is DNR -consult palliative care medicine for GOC and to help with disposition    Disposition Plan:   Remain in stepdown  Family Communication:   Son  update at bedside--Total time spent 35 minutes.  Greater than 50% spent face to face counseling and coordinating care.   Consultants:  Cardiology, [facility], palliative medicine  Code Status:  FULL   DVT Prophylaxis:  [place] Heparin    Procedures: As Listed in Progress Note Above  Antibiotics: Ceftriaxone 4/27>>>4/29      Subjective: Patient denies fevers, chills, headache, chest pain, dyspnea, nausea, vomiting, diarrhea, abdominal pain, dysuria, hematuria, hematochezia, and melena.   Objective: Vitals:   08/05/16 0407 08/05/16 0600 08/05/16 0700 08/05/16 0800   BP:  (!) 129/57  119/66  Pulse:  98 88 85  Resp:  (!) Temp: 98.2 F (36.8 C)  97.8 F (36.6 C)  TempSrc: Axillary   Oral  SpO2:  91% 95% 94%  Weight:      Height:        Intake/Output Summary (Last 24 hours) at 08/05/16 0921 Last data filed at 08/05/16 0900  Gross per 24 hour  Intake          2141.25 ml  Output              400 ml  Net          1741.25 ml   Weight change:  Exam:   General:  Pt is alert, follows commands appropriately, not in acute distress  HEENT: No icterus, No thrush, No neck mass, [place]/AT  Cardiovascular: IRRR, S1/S2, no rubs, no gallops  Respiratory: bibasilar crackles, no wheeze  Abdomen: Soft/+BS, non tender, non distended, no guarding  Extremities: No edema, No lymphangitis, No petechiae, No rashes, no synovitis   Data Reviewed: I have personally reviewed following labs and imaging studies Basic Metabolic Panel:  Recent Labs Lab 07/30/16 0437 08/01/16 0618 08/03/16 1533 08/04/16 0401 08/05/16 0349  NA 134* 135 133* 136 135  K 3.4* 5.1 5.3* 5.2* 4.2  CL 100* 102 98* 102 106  CO2 20*  GLUCOSE 122* 101* 140* 137* 131*  BUN 20 31* 27* 24* 27*  CREATININE 1.24* 1.56* 1.41* 1.36* 1.52*  CALCIUM 8.1* 8.4* 9.2 8.9 8.0*  MG  --   --   --  2.1  --    Liver Function Tests:  Recent Labs Lab 08/03/16 1533  AST 43*  ALT 19  ALKPHOS 75  BILITOT 0.8  PROT 7.2  ALBUMIN 3.3*   No results for input(s): LIPASE, AMYLASE in the last 168 hours.  Recent Labs Lab 08/04/16 1119  AMMONIA 16   Coagulation Profile:  Recent Labs Lab 08/03/16 2241  INR 1.29   CBC:  Recent Labs Lab 07/29/16 2156 08/03/16 1533 08/05/16 0349  WBC 9.4 9.4 10.7*  NEUTROABS 6.4 8.2*  --   HGB 10.3* 11.2* 9.7*  HCT 33.5* 37.7 32.5*  MCV 77.4* 77.3* 76.5*  PLT 211 259 258   Cardiac Enzymes:  Recent Labs Lab 07/30/16 0832 07/30/16 1542 08/03/16 1925 08/04/16 1119 08/04/16 1746  TROPONINI 0.16* 0.13* 2.51* 10.19* 12.53*    BNP: Invalid input(s): POCBNP CBG:  Recent Labs Lab 07/30/16 0741 07/31/16 0748 08/01/16 0747 08/02/16 0733  GLUCAP 113* 104* 96 96   HbA1C: No results for input(s): HGBA1C in the last 72 hours. Urine analysis:    Component Value Date/Time   COLORURINE YELLOW 08/03/2016 1818   APPEARANCEUR CLEAR 08/03/2016 1818   LABSPEC 1.016 08/03/2016 1818   PHURINE 6.0 08/03/2016 1818   GLUCOSEU NEGATIVE 08/03/2016 1818   HGBUR SMALL (A) 08/03/2016 1818   BILIRUBINUR NEGATIVE 08/03/2016 1818   KETONESUR NEGATIVE 08/03/2016 1818   PROTEINUR 100 (A) 08/03/2016 1818   UROBILINOGEN 1.0 08/10/2014 1441   NITRITE NEGATIVE 08/03/2016 1818   LEUKOCYTESUR NEGATIVE 08/03/2016 1818   Sepsis Labs: (procalcitonin:4,lacticidven:4) ) Recent Results (from the past 240 hour(s))  Urine culture     Status: Abnormal   Collection Time: 07/29/16 10:30 PM  Result Value Ref Range Status   Specimen Description URINE, CATHETERIZED  Final   Special Requests NONE  Final   Culture >=100,000 COLONIES/mL ESCHERICHIA COLI (A)  Final   Report Status 08/01/2016 FINAL  Final   Organism ID, Bacteria ESCHERICHIA COLI (A)  Final      Susceptibility  Escherichia coli - MIC*    AMPICILLIN RESISTANT Resistant     CEFAZOLIN <=4 SENSITIVE Sensitive     CEFTRIAXONE <=1 SENSITIVE Sensitive     CIPROFLOXACIN <=0.25 SENSITIVE Sensitive     GENTAMICIN <=1 SENSITIVE Sensitive     IMIPENEM <=0.25 SENSITIVE Sensitive     NITROFURANTOIN <=16 SENSITIVE Sensitive     TRIMETH/SULFA <=20 SENSITIVE Sensitive     AMPICILLIN/SULBACTAM 4 SENSITIVE Sensitive     PIP/TAZO 32 INTERMEDIATE Intermediate     Extended ESBL NEGATIVE Sensitive     * >=100,000 COLONIES/mL ESCHERICHIA COLI  Body fluid culture     Status: None   Collection Time: 07/30/16  5:00 PM  Result Value Ref Range Status   Specimen Description SYNOVIAL LEFT KNEE  Final   Special Requests NONE  Final   Gram Stain   Final    CYTOSPIN SMEAR NO ORGANISMS  SEEN WBC PRESENT, PREDOMINANTLY PMN    Culture   Final    NO GROWTH 3 DAYS Performed at [facility], [address] [place], [place] [address]    Report Status 08/03/2016 FINAL  Final  Gram stain     Status: None   Collection Time: 07/30/16  5:00 PM  Result Value Ref Range Status   Specimen Description SYNOVIAL LEFT KNEE  Final   Special Requests NONE  Final   Gram Stain   Final    CYTOSPIN SMEAR NO ORGANISMS SEEN WBC PRESENT, PREDOMINANTLY PMN    Report Status 07/30/2016 FINAL  Final  MRSA PCR Screening     Status: None   Collection Time: 08/03/16  9:48 PM  Result Value Ref Range Status   MRSA by PCR NEGATIVE NEGATIVE Final    Comment:        The GeneXpert MRSA Assay (FDA approved for NASAL specimens only), is one component of a comprehensive MRSA colonization surveillance program. It is not intended to diagnose MRSA infection nor to guide or monitor treatment for MRSA infections.      Scheduled Meds: . metoprolol  2.5 mg Intravenous Q6H  . pantoprazole (PROTONIX) IV  40 mg Intravenous Q24H   Continuous Infusions: . sodium chloride Stopped (08/04/16 1033)  . cefTRIAXone (ROCEPHIN)  IV Stopped (08/04/16 1103)  . diltiazem (CARDIZEM) infusion 15 mg/hr (08/05/16 0900)  . sodium chloride 0.9 % 1,000 mL infusion 75 mL/hr at 08/05/16 0900    Procedures/Studies: Dg Knee 1-2 Views Left  Result Date: 07/30/2016 CLINICAL DATA:  83 year old female with bilateral lower extremity swelling and left knee pain. EXAM: LEFT KNEE - 1-2 VIEW COMPARISON:  Left knee radiograph dated 08/12/2014 FINDINGS: There is no acute fracture or dislocation. The bones are osteopenic. There is severe osteoarthritic changes of the knee with tricompartmental narrowing most severely involving the lateral compartment. There is a moderate size suprapatellar effusion. There is vascular atherosclerotic disease. IMPRESSION: 1. No acute fracture or dislocation. 2. Severe osteoarthritis. 3. Moderate  size suprapatellar effusion. Electronically Signed   By: Elgie Collard M.D.   On: 07/30/2016 01:04   Ct Head Wo Contrast  Result Date: 08/04/2016 CLINICAL DATA:  83 year old female with a history of confusion and delirium EXAM: CT HEAD WITHOUT CONTRAST TECHNIQUE: Contiguous axial images were obtained from the base of the skull through the vertex without intravenous contrast. COMPARISON:  11/17/2009, 03/19/2009, MR 03/16/2009 FINDINGS: Brain: No acute intracranial hemorrhage. No midline shift or mass effect. Encephalomalacia of the left MCA territory related to prior infarction. There is increasing hypodensity in the white matter  in the left perisylvian region. Gray-white differentiation is relatively maintained. Unremarkable configuration the ventricles. Calcifications of the intracranial vasculature. Vascular: Redemonstration of the known right paraclinoid aneurysm, which is similar and diameter to the comparison, Approximately 15 mm x 12 mm on the current. Hyperdense appearance of the aneurysm on the current. Relatively similar appearance of known left supraclinoid aneurysm. Skull: No displaced fracture.  No progressive bony lesion. Sinuses/Orbits: Unremarkable appearance of the paranasal sinuses and orbits. Other: None IMPRESSION: No CT evidence of acute intracranial abnormality. Re- demonstration of known right and left para clinoid aneurysm. The largest is on the right, with relatively unchanged maximal diameter of approximately 15 mm. There is hyperdense appearance of the aneurysm, which may represent internal thrombus, and if there were concern for acute right MCA infarction, CTA may be considered as a more sensitive test, as well as consideration of neurology consultation. Left sided encephalomalacia related to prior left MCA infarction. There is progression of white matter disease in the left perisylvian region. These results were called by telephone at the time of interpretation on 08/04/2016 at  11:57 am to the nurse caring for the patient, Ms Stevphen Meuse who verbally acknowledged these results. Electronically Signed   By: Gilmer Mor D.O.   On: 08/04/2016 12:02   Dg Chest Port 1 View  Result Date: 08/04/2016 CLINICAL DATA:  Hypoxia EXAM: PORTABLE CHEST 1 VIEW COMPARISON:  07/29/2016 FINDINGS: There is bilateral interstitial thickening. There is left mid lung airspace disease consistent with atelectasis. There are bilateral small pleural effusions. There is no pneumothorax. There is severe cardiomegaly. The osseous structures are unremarkable. IMPRESSION: 1. Findings concerning for mild CHF. Electronically Signed   By: Elige Ko   On: 08/04/2016 11:33   Dg Chest Port 1 View  Result Date: 07/29/2016 CLINICAL DATA:  Shortness of breast started 3 days ago. EXAM: PORTABLE CHEST 1 VIEW COMPARISON:  07/14/2012 FINDINGS: The cardio pericardial silhouette is enlarged. There is pulmonary vascular congestion without overt pulmonary edema. Stable opacity medial right apex. Degenerative changes left shoulder. Telemetry leads overlie the chest. IMPRESSION: 1. Cardiomegaly with vascular congestion. 2. Right apical opacity. Follow-up dedicated PA and lateral chest x-ray, after resolution of acute symptoms, recommended to further evaluate. Electronically Signed   By: Kennith Center M.D.   On: 07/29/2016 22:02   Dg Hip Unilat W Or Wo Pelvis 2-3 Views Right  Result Date: 08/03/2016 CLINICAL DATA:  Hip pain after fall.  Initial encounter. EXAM: DG HIP (WITH OR WITHOUT PELVIS) 2-3V RIGHT COMPARISON:  None. FINDINGS: Right femoral neck fracture, transcervical to basicervical. The right femoral head is located. No evidence of pelvic ring fracture. Osteopenia and atherosclerosis. Advanced lower lumbar disc degeneration. IMPRESSION: Displaced right femoral neck fracture. Electronically Signed   By: Marnee Spring M.D.   On: 08/03/2016 16:32    Johnmark Geiger, DO  [facility] Pager [phone]  If  7PM-7AM, please contact night-coverage [email or web address] Password [email or web address] 08/05/2016, 9:21 AM   LOS: 2 days

## 2016-08-05 NOTE — Progress Notes (Signed)
SUBJECTIVE: The patient is confused, complains of pain,  No chest pain or SOB  . metoprolol  2.5 mg Intravenous Q6H  . pantoprazole (PROTONIX) IV  40 mg Intravenous Q24H   . sodium chloride Stopped (08/04/16 1033)  . cefTRIAXone (ROCEPHIN)  IV Stopped (08/04/16 1103)  . diltiazem (CARDIZEM) infusion 15 mg/hr (08/05/16 0747)  . sodium chloride 0.9 % 1,000 mL infusion 75 mL/hr at 08/05/16 0400    OBJECTIVE: Physical Exam: Vitals:   08/05/16 0000 08/05/16 0200 08/05/16 0400 08/05/16 0407  BP: (!) 136/91 (!) 123/49 125/89   Pulse: (!) 102 91 90   Resp: (!) 24 (!) 26 (!) 21   Temp:    98.2 F (36.8 C)  TempSrc:    Axillary  SpO2: 94% 93% 92%   Weight:      Height:        Intake/Output Summary (Last 24 hours) at 08/05/16 1610 Last data filed at 08/05/16 0400  Gross per 24 hour  Intake          1706.25 ml  Output              400 ml  Net          1306.25 ml    Telemetry reveals afib, V rates 80s-110s  GEN- The patient is ill, confused, wearing mittons, able to answer questions Head- normocephalic, atraumatic Eyes-  Sclera clear, conjunctiva pink Ears- hearing intact Oropharynx- clear Neck- supple  Lungs- Clear to ausculation bilaterally, normal work of breathing Heart- irregular rate and rhythm, 2/6 high pitched murmur along LSB GI- soft, NT, ND, + BS Extremities- no clubbing, cyanosis, or edema Skin- no rash or lesion Psych- confused but interactive  LABS: Basic Metabolic Panel:  Recent Labs  96/04/54 0401 08/05/16 0349  NA 136 135  K 5.2* 4.2  CL 102 106  CO2 22 20*  GLUCOSE 137* 131*  BUN 24* 27*  CREATININE 1.36* 1.52*  CALCIUM 8.9 8.0*  MG 2.1  --    Liver Function Tests:  Recent Labs  08/03/16 1533  AST 43*  ALT 19  ALKPHOS 75  BILITOT 0.8  PROT 7.2  ALBUMIN 3.3*   No results for input(s): LIPASE, AMYLASE in the last 72 hours. CBC:  Recent Labs  08/03/16 1533 08/05/16 0349  WBC 9.4 10.7*  NEUTROABS 8.2*  --   HGB 11.2* 9.7*    HCT 37.7 32.5*  MCV 77.3* 76.5*  PLT 259 258   ASSESSMENT AND PLAN:   NSTEMI Pt with robust elevation in CMs.  She has known CTO of the RCA.  She has minimal symptoms.  I worry about perioperative risks however.   Medical management is advised.  Echo pending.  No plans for cath or other testing.  Will not use heparin given recent fall and risks of anticoagulation.  Fall - Sounds mechanical, no concern for syncope  Afib - Permanent, not anticoagulation candidate per previous notes.  Currently rate controlled on diltiazem drip.  As pain gets better controlled, would switch back to home oral medications. Note: patient previously developed heart block while on digoxin, would avoid.   use metoprolol also but does not appear to be taking POs currently.  Pre operative risk assessment Given advanced age, delerium, NSTEMI/CAD, and afib, and prior stroke, she is at high risk for any procedures.  With confusion, recovery from hip surgery would be tough.  I do not feel that any further CV testing would reduce her risks.  I had a  long discussion with family.  They are aware that risks are increased. Their primary concern is keeping her comfortable.  We agree that palliative care consultation would be beneficial.  The patients son reports that she has been "ready to die" for a year and has been talking about it a lot for the past few weeks.  They do not wish to put her through anything that she would not want to be subjected to. If palliative care and patients family decide to proceed with surgery, procedure would likely be high risk and palliative in nature.  I have spoken with Dr Tat who also agrees with this approach.  Very complicated patient.  A high level of decision making was required for this encounter.   Cardiology to follow with you  Hillis Range, MD 08/05/2016 8:21 AM

## 2016-08-05 NOTE — Consult Note (Signed)
Consultation Note Date: 08/05/2016   Patient Name: [name]  DOB: [birth year]  MRN: [id number]  Age / Sex: 83 y.o., female  PCP: [name], MD Referring Physician: Orson Eva, MD  Reason for Consultation: Establishing goals of care and Pain control  HPI/Patient Profile: 83 y.o. female  with past medical history of permanent a-fib, CHF, HTN, CAD, stroke, IDA, HLP admitted on 08/03/2016 with hip fracture and subsequently discovered to have NSTEMI.  She was recently admitted for knee effusion and UTI and discharged home as family was concerned that she would fall at rehab.  Unfortunately, she had a fall at home and suffered displaced femoral neck fracture.  She developed RVR and troponin elevation and was started on diltiazem drip.  She is at extremely high risk for surgery and palliative consulted for goals of care.   Clinical Assessment and Goals of Care: I met today with patient's son and daughter.  Ms. Gosdin is confused and cannot participate in goals conversation.  We discussed her clinical course that is complicated by NSTEMI as well as wishes moving forward in light of her continued decline, hip fracture, and NSTEMI.   Values and goals of care important to patient and family were attempted to be elicited.  We discussed difference between a aggressive medical intervention path, including potential for surgery, and a palliative, comfort focused care path.  Concepts of Hospice and Palliative Care were discussed  Questions and concerns addressed.   PMT will continue to support holistically.  SUMMARY OF RECOMMENDATIONS   - Family does not want to pursue surgery at this point. - They understand her poor prognosis.  Her son states, "Honestly, I'm expecting her to die from this." - Plan to add scheduled pain oxycodone and tylenol.  Will also liberalize dilaudid to Q1 hour prn.  Unfortunately, much of her  pain is incident pain from movement related to her fracture, which is extremely difficult to treat.  They are concerned about her getting "too much" pain medication and being sleepy. - Family reports that, while no plan for surgery at this time, it is not something that they would take off the table as a possibility until it is demonstrated that her pain can be adequately controlled while not "speeding her along with pain medications"   Code Status/Advance Care Planning:  DNR   Symptom Management:   As above  Palliative Prophylaxis:   Frequent Pain Assessment  Prognosis:   Unable to determine  Discharge Planning: To Be Determined      Primary Diagnoses: Present on Admission: . Femur fracture ([place]) . Atrial fibrillation ([place]) . Chronic diastolic CHF (congestive heart failure) ([place]) . Gastroesophageal reflux disease . Hypertension . UTI (urinary tract infection) . Elevated troponin . NSTEMI (non-ST elevated myocardial infarction) ([place])   I have reviewed the medical record, interviewed the patient and family, and examined the patient. The following aspects are pertinent.  Past Medical History:  Diagnosis Date  . Arteriosclerotic cardiovascular disease (ASCVD)    H/o MI; cath in 2001:30-40% LAD,  nl Cx, TO RCA with collaterals, EF-70% with inferior hypokinesis; repeat cath in 2002-unchanged; 2006: Neg stress nuclear  . Atrial fibrillation (HCC)    No anticoagulation  . Congestive heart failure ([place])    06/2012 Echo: Moderate to severe LVH, nl EF, moderate LAE; mild to moderate pulmonary hypertension  . Diverticulitis    Extensive diverticulosis in 2006  . Gastroesophageal reflux disease   . Hyperlipidemia    Lipid profile 2010:112, 106, 58, 33  . Hypertension    Nl renal arteriography in 2001  . Ischemic colitis ([place]) 2006  . MI, old   . Nodule of right lung   . PONV (postoperative nausea and vomiting)   . 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 Syncope 2003, 2014   Attributed to UTI in 2003, acute gastroenteritis in 2014   Social History   Social History  . Marital status: Widowed    Spouse name: N/A  . Number of children: N/A  . Years of education: N/A   Social History Main Topics  . Smoking status: Never Smoker  . Smokeless tobacco: Never Used  . Alcohol use No  . Drug use: No  . Sexual activity: Not Asked   Other Topics Concern  . None   Social History Narrative  . None   Family History  Problem Relation Age of Onset  . Cancer Mother   . Cancer Sister   . Heart attack Father   . Colon cancer Neg Hx    Scheduled Meds: . chlorhexidine  15 mL Mouth Rinse BID  . heparin subcutaneous  5,000 Units Subcutaneous Q8H  . mouth rinse  15 mL Mouth Rinse q12n4p  . metoprolol  2.5 mg Intravenous Q6H  . pantoprazole (PROTONIX) IV  40 mg Intravenous Q24H   Continuous Infusions: . sodium chloride Stopped (08/04/16 1033)  . diltiazem (CARDIZEM) infusion 15 mg/hr (08/05/16 1500)   PRN Meds:.acetaminophen **OR** acetaminophen, haloperidol lactate, HYDROmorphone (DILAUDID) injection, ondansetron **OR** ondansetron (ZOFRAN) IV, polyethylene glycol Medications Prior to Admission:  Prior to Admission medications   Medication Sig Start Date End Date Taking? Authorizing Provider  aspirin EC 325 MG tablet Take 1 tablet (325 mg total) by mouth daily. 08/02/16  Yes [name], MD  cephALEXin (KEFLEX) 500 MG capsule Take 1 capsule (500 mg total) by mouth 2 (two) times daily. 08/02/16  Yes [name], MD  diltiazem (CARDIZEM CD) 240 MG 24 hr capsule Take 1 capsule (240 mg total) by mouth daily. Patient taking differently: Take 120 mg by mouth 2 (two) times daily.  07/08/12  Yes [name], MD  ferrous sulfate 325 (65 FE) MG tablet Take 1 tablet (325 mg total) by mouth 2 (two) times daily with a meal. 08/02/16  Yes [name], MD  furosemide (LASIX) 20 MG tablet Take  20 mg by mouth every other day.  06/20/12  Yes Historical Provider, MD  guaiFENesin-dextromethorphan (ROBITUSSIN DM) 100-10 MG/5ML syrup Take 10 mLs by mouth every 6 (six) hours as needed for cough. 08/02/16  Yes [name], MD  losartan (COZAAR) 100 MG tablet Take 100 mg by mouth daily.  06/20/12  Yes Historical Provider, MD  metoprolol (LOPRESSOR) 50 MG tablet Take 1 tablet (50 mg total) by mouth 2 (two) times daily. Patient taking differently: Take 25 mg by mouth 2 (two) times daily.  07/08/12  Yes [name], MD  pantoprazole (PROTONIX) 40 MG tablet Take 1 tablet (40  mg total) by mouth daily. 07/08/12  Yes [name], MD  polyethylene glycol [facility] / GLYCOLAX) packet Take 17 g by mouth daily as needed.   Yes Historical Provider, MD  predniSONE (DELTASONE) 5 MG tablet Take 1 tablet (5 mg total) by mouth daily with breakfast. 08/02/16  Yes [name], MD   Allergies  Allergen Reactions  . Ciprofloxacin Rash   Review of Systems  Confused and unreliable historian  Physical Exam  General: Alert, awake, confused HEENT: No bruits, no goiter, no JVD Heart: irregular rhythm. No murmur appreciated. Lungs: Fair air movement, + crackles in bases Abdomen: Soft, nontender, nondistended, positive bowel sounds.  Skin: Warm and dry  Vital Signs: BP (!) 108/54   Pulse 93   Temp 98.2 F (36.8 C) (Oral)   Resp 19   Ht 5' 9"  (1.753 m)   Wt 86.2 kg (190 lb 0.6 oz)   SpO2 92%   BMI 28.06 kg/m  Pain Assessment: PAINAD   Pain Score: 0-No pain   SpO2: SpO2: 92 % O2 Device:SpO2: 92 % O2 Flow Rate: .O2 Flow Rate (L/min): 6 L/min  IO: Intake/output summary:  Intake/Output Summary (Last 24 hours) at 08/05/16 1614 Last data filed at 08/05/16 1500  Gross per 24 hour  Intake             1760 ml  Output              300 ml  Net             1460 ml    LBM: Last BM Date:  (UTA) Baseline Weight: Weight: 81.5 kg (179 lb 10.8 oz) Most recent weight: Weight: 86.2 kg (190 lb 0.6 oz)     Palliative  Assessment/Data:   Flowsheet Rows     Most Recent Value  Intake Tab  Referral Department  Hospitalist  Unit at Time of Referral  Intermediate Care Unit  Palliative Care Primary Diagnosis  Cardiac  Date Notified  08/05/16  Palliative Care Type  New Palliative care  Reason for referral  Pain, Clarify Goals of Care  Date of Admission  08/03/16  Date first seen by Palliative Care  08/05/16  # of days Palliative referral response time  0 Day(s)  # of days IP prior to Palliative referral  2  Clinical Assessment  Palliative Performance Scale Score  30%  Pain Max last 24 hours  10  Pain Min Last 24 hours  5  Psychosocial & Spiritual Assessment  Palliative Care Outcomes  Patient/Family meeting held?  Yes  Who was at the meeting?  Son and Daughter  Palliative Care Outcomes  Improved pain interventions, Clarified goals of care      Time In: 1325 Time Out: 1445 Time Total: 80 Greater than 50%  of this time was spent counseling and coordinating care related to the above assessment and plan.  Signed by: Micheline Rough, MD   Please contact Palliative Medicine Team phone at [phone] for questions and concerns.  For individual provider: See Shea Grandfield

## 2016-08-05 NOTE — Evaluation (Signed)
Clinical/Bedside Swallow Evaluation Patient Details  Name: [name] MRN: [id number] Date of Birth: 1917-11-25  Today's Date: 08/05/2016 Time: SLP Start Time (ACUTE ONLY): 1508 SLP Stop Time (ACUTE ONLY): 1527 SLP Time Calculation (min) (ACUTE ONLY): 19 min  Past Medical History:  Past Medical History:  Diagnosis Date  . Arteriosclerotic cardiovascular disease (ASCVD)    H/o MI; cath in 2001:30-40% LAD, nl Cx, TO RCA with collaterals, EF-70% with inferior hypokinesis; repeat cath in 2002-unchanged; 2006: Neg stress nuclear  . Atrial fibrillation (HCC)    No anticoagulation  . Congestive heart failure (HCC)    06/2012 Echo: Moderate to severe LVH, nl EF, moderate LAE; mild to moderate pulmonary hypertension  . Diverticulitis    Extensive diverticulosis in 2006  . Gastroesophageal reflux disease   . Hyperlipidemia    Lipid profile 2010:112, 106, 58, 33  . Hypertension    Nl renal arteriography in 2001  . Ischemic colitis (HCC) 2006  . MI, old   . Nodule of right lung   . PONV (postoperative nausea and vomiting)   . Stroke [facility]) 2000 and   2010: Left MCA; negative carotid ultrasound; supraclinoid aneurysm bilaterally; presumed embolic from AF  . Syncope 2003, 2014   Attributed to UTI in 2003, acute gastroenteritis in 2014   Past Surgical History:  Past Surgical History:  Procedure Laterality Date  . COLONOSCOPY  2006   Dr. Karilyn Cota: ischemic colitis involving descending colon, extensive diverticula  . ESOPHAGOGASTRODUODENOSCOPY (EGD) WITH ESOPHAGEAL DILATION N/A 06/24/2012   Procedure: ESOPHAGOGASTRODUODENOSCOPY (EGD) WITH ESOPHAGEAL DILATION;  Surgeon: Corbin Ade, MD;  Location: [facility];  Service: Endoscopy;  Laterality: N/A;  . KNEE SURGERY     HPI:  83 year old female with a history of permanent atrial fibrillation, diastolic CHF, hypertension, coronary artery disease, stroke, iron deficiency anemia, and hyperlipidemia who presented after a mechanical fall at  home. Pt had several MBS in 2010 but reports are not available.    Assessment / Plan / Recommendation Clinical Impression  Pt was agreeable to limited amounts of PO intake. She could only sit partially upright due to pain in her hip, therefore reverse trendelenberg was sued to maximize safe positioning. Pt had no overt signs of aspiration with intake of thin liquids and purees, although question some increased shortness of breath particularly after larger sips of thin liquids. Pt was alert and conversant at the start of testing, but quickly became drowsy, which my be in part related to pain medication administered prior to testing per family report. For today, recommend allowing small sips of plain water after oral care as well as meds crushed in puree. Will continue to follow for diet advancement as able. SLP Visit Diagnosis: Dysphagia, unspecified (R13.10)    Aspiration Risk  Moderate aspiration risk    Diet Recommendation NPO except meds;Ice chips PRN after oral care;Free water protocol after oral care   Liquid Administration via: Straw;Spoon Medication Administration: Crushed with puree Supervision: Staff to assist with self feeding;Full supervision/cueing for compensatory strategies Compensations: Slow rate;Small sips/bites Postural Changes: Seated upright at 90 degrees    Other  Recommendations Oral Care Recommendations: Oral care QID;Oral care prior to ice chip/H20 Other Recommendations: Have oral suction available   Follow up Recommendations  (tba)      Frequency and Duration min 2x/week  2 weeks       Prognosis Prognosis for Safe Diet Advancement: Good      Swallow Study   General HPI: 82 year old female with a history  of permanent atrial fibrillation, diastolic CHF, hypertension, coronary artery disease, stroke, iron deficiency anemia, and hyperlipidemia who presented after a mechanical fall at home. Pt had several MBS in 2010 but reports are not available.  Type of  Study: Bedside Swallow Evaluation Previous Swallow Assessment: see HPI Diet Prior to this Study: NPO Temperature Spikes Noted: Yes (100.4) Respiratory Status: Nasal cannula History of Recent Intubation: No Behavior/Cognition: Alert;Cooperative;Requires cueing Oral Cavity Assessment: Dry Oral Care Completed by SLP: Recent completion by staff Oral Cavity - Dentition: Missing dentition Self-Feeding Abilities: Needs assist Patient Positioning: Upright in bed Baseline Vocal Quality: Normal    Oral/Motor/Sensory Function Overall Oral Motor/Sensory Function: Generalized oral weakness   Ice Chips Ice chips: Not tested (pt declined)   Thin Liquid Thin Liquid: Impaired Presentation: Straw;Spoon Pharyngeal  Phase Impairments: Change in Vital Signs (appeared to have subtle increase in SOB)    Nectar Thick Nectar Thick Liquid: Not tested   Honey Thick Honey Thick Liquid: Not tested   Puree Puree: Within functional limits Presentation: Spoon   Solid   GO   Solid: Not tested        [name] 08/05/2016,3:46 PM   [name], M.A. CCC-SLP [phone]

## 2016-08-06 ENCOUNTER — Inpatient Hospital Stay (HOSPITAL_COMMUNITY): Payer: Medicare Other

## 2016-08-06 DIAGNOSIS — R7989 Other specified abnormal findings of blood chemistry: Secondary | ICD-10-CM

## 2016-08-06 DIAGNOSIS — R079 Chest pain, unspecified: Secondary | ICD-10-CM

## 2016-08-06 DIAGNOSIS — J9600 Acute respiratory failure, unspecified whether with hypoxia or hypercapnia: Secondary | ICD-10-CM

## 2016-08-06 DIAGNOSIS — I509 Heart failure, unspecified: Secondary | ICD-10-CM

## 2016-08-06 DIAGNOSIS — J9601 Acute respiratory failure with hypoxia: Secondary | ICD-10-CM

## 2016-08-06 DIAGNOSIS — N183 Chronic kidney disease, stage 3 (moderate): Secondary | ICD-10-CM

## 2016-08-06 DIAGNOSIS — N179 Acute kidney failure, unspecified: Secondary | ICD-10-CM

## 2016-08-06 LAB — CBC
HCT: 31.8 % — ABNORMAL LOW (ref 36.0–46.0)
Hemoglobin: 9.8 g/dL — ABNORMAL LOW (ref 12.0–15.0)
MCH: 23.9 pg — AB (ref 26.0–34.0)
MCHC: 30.8 g/dL (ref 30.0–36.0)
MCV: 77.6 fL — AB (ref 78.0–100.0)
PLATELETS: 237 10*3/uL (ref 150–400)
RBC: 4.1 MIL/uL (ref 3.87–5.11)
RDW: 17.4 % — ABNORMAL HIGH (ref 11.5–15.5)
WBC: 10.5 10*3/uL (ref 4.0–10.5)

## 2016-08-06 LAB — BASIC METABOLIC PANEL
Anion gap: 9 (ref 5–15)
BUN: 36 mg/dL — AB (ref 6–20)
CALCIUM: 8.2 mg/dL — AB (ref 8.9–10.3)
CHLORIDE: 107 mmol/L (ref 101–111)
CO2: 21 mmol/L — ABNORMAL LOW (ref 22–32)
CREATININE: 1.88 mg/dL — AB (ref 0.44–1.00)
GFR, EST AFRICAN AMERICAN: 25 mL/min — AB (ref >60)
GFR, EST NON AFRICAN AMERICAN: 21 mL/min — AB (ref >60)
Glucose, Bld: 121 mg/dL — ABNORMAL HIGH (ref 65–99)
Potassium: 4.5 mmol/L (ref 3.5–5.1)
Sodium: 137 mmol/L (ref 135–145)

## 2016-08-06 LAB — MAGNESIUM: MAGNESIUM: 2.2 mg/dL (ref 1.7–2.4)

## 2016-08-06 LAB — ECHOCARDIOGRAM COMPLETE
Height: 69 in
WEIGHTICAEL: 3040.58 [oz_av]

## 2016-08-06 MED ORDER — ASPIRIN 325 MG PO TABS
325.0000 mg | ORAL_TABLET | Freq: Every day | ORAL | Status: DC
  Administered 2016-08-06 – 2016-08-07 (×2): 325 mg via ORAL
  Filled 2016-08-06 (×2): qty 1

## 2016-08-06 MED ORDER — DILTIAZEM HCL 30 MG PO TABS
30.0000 mg | ORAL_TABLET | Freq: Four times a day (QID) | ORAL | Status: DC
  Administered 2016-08-06 – 2016-08-07 (×4): 30 mg via ORAL
  Filled 2016-08-06 (×4): qty 1

## 2016-08-06 MED ORDER — SODIUM CHLORIDE 0.9 % IV SOLN
INTRAVENOUS | Status: DC
  Administered 2016-08-06 (×2): via INTRAVENOUS

## 2016-08-06 MED ORDER — METOPROLOL TARTRATE 25 MG PO TABS
25.0000 mg | ORAL_TABLET | Freq: Two times a day (BID) | ORAL | Status: DC
  Administered 2016-08-06 – 2016-08-07 (×2): 25 mg via ORAL
  Filled 2016-08-06 (×2): qty 1

## 2016-08-06 MED ORDER — SODIUM CHLORIDE 0.9 % IV SOLN: INTRAVENOUS | Status: DC

## 2016-08-06 NOTE — Care Management Note (Signed)
Case Management Note  Patient Details  Name: MARLICIA SROKA MRN: [id number] Date of Birth: 13-Dec-1917  Subjective/Objective:          Larey Seat at home -fx rt femoral head, surg intervention holding due to cardiac status. A. Fib-Iv Cardizem drip/-Severe cardiomegaly/cxr- left middle lobe atelectasis.          Action/Plan:   Expected Discharge Date:                  Expected Discharge Plan:  Skilled Nursing Facility  In-House Referral:  Clinical Social Work  Discharge planning Services  CM Consult  Post Acute Care Choice:    Choice offered to:     DME Arranged:    DME Agency:     HH Arranged:    HH Agency:     Status of Service:  In process, will continue to follow  If discussed at Long Length of Stay Meetings, dates discussed:    Additional Comments:  Golda Acre, RN 08/06/2016, 9:57 AM

## 2016-08-06 NOTE — Progress Notes (Signed)
Case discussed with palliative medicine, Dr. Neale Burly. After another family meeting today with Dr. Neale Burly, the family has decided to transition focus of care to that to focus on full comfort.  Transition to po diltiazem as pt is able to tolerate po.  Discontinue diltiazem drip Transition to po metoprolol. Transfer to med-surg bed. D/C ABG -continue IVF for now due to no po intake. -plan to transition to residential hospice 08/07/16 if stable and bed is available.  DTat

## 2016-08-06 NOTE — Progress Notes (Signed)
  Echocardiogram 2D Echocardiogram has been performed.  [name] 08/06/2016, 9:54 AM

## 2016-08-06 NOTE — Progress Notes (Signed)
Progress Note  Patient Name: [name] Date of Encounter: 08/06/2016  Primary Cardiologist: Dr. Johnsie Cancel  Subjective   Awake having echo done.  No acute pain now.  Inpatient Medications    Scheduled Meds: . acetaminophen  650 mg Oral Q6H  . chlorhexidine  15 mL Mouth Rinse BID  . heparin subcutaneous  5,000 Units Subcutaneous Q8H  . mouth rinse  15 mL Mouth Rinse q12n4p  . metoprolol  2.5 mg Intravenous Q6H  . oxyCODONE  5 mg Oral Q6H  . pantoprazole (PROTONIX) IV  40 mg Intravenous Q24H   Continuous Infusions: . sodium chloride Stopped (08/04/16 1033)  . diltiazem (CARDIZEM) infusion 15 mg/hr (08/06/16 0600)   PRN Meds: acetaminophen **OR** acetaminophen, haloperidol lactate, HYDROmorphone (DILAUDID) injection, ondansetron **OR** ondansetron (ZOFRAN) IV, polyethylene glycol   Vital Signs    Vitals:   08/06/16 0500 08/06/16 0600 08/06/16 0700 08/06/16 0733  BP:  115/64    Pulse: 88 69 74 82  Resp: (!) _0 (!) 26  Temp:    97.7 F (36.5 C)  TempSrc:    Oral  SpO2: (!) 87% 93% 98% 97%  Weight:      Height:        Intake/Output Summary (Last 24 hours) at 08/06/16 0842 Last data filed at 08/06/16 0600  Gross per 24 hour  Intake              675 ml  Output              300 ml  Net              375 ml   Filed Weights   08/03/16 2204 08/05/16 1200  Weight: 179 lb 10.8 oz (81.5 kg) 190 lb 0.6 oz (86.2 kg)    Telemetry    A fib rate controlled on dilt drip.  - Personally Reviewed  ECG    On the 28th of April,  A fib with RVR at 104 now with Q waves in V1 and V2.    - Personally Reviewed  Physical Exam   GEN: No acute distress.   Neck: No JVD Cardiac: irreg irreg, no murmurs, rubs, or gallops.  Respiratory: Clear, ant. to auscultation bilaterally. GI: Soft, nontender, non-distended  MS: No edema; No deformity. Rt foot turned laterally  Neuro:  Nonfocal  Psych: Normal affect to flat  Labs    Chemistry Recent Labs Lab 08/03/16 1533  08/04/16 0401 08/05/16 0349 08/06/16 0340  NA 133* 136 135 137  K 5.3* 5.2* 4.2 4.5  CL 98* 102 106 107  CO2 24 22 20* 21*  GLUCOSE 140* 137* 131* 121*  BUN 27* 24* 27* 36*  CREATININE 1.41* 1.36* 1.52* 1.88*  CALCIUM 9.2 8.9 8.0* 8.2*  PROT 7.2  --   --   --   ALBUMIN 3.3*  --   --   --   AST 43*  --   --   --   ALT 19  --   --   --   ALKPHOS 75  --   --   --   BILITOT 0.8  --   --   --   GFRNONAA 30* 32* 28* 21*  GFRAA 35* 37* 32* 25*  ANIONGAP _1 Hematology Recent Labs Lab 08/03/16 1533 08/05/16 0349 08/06/16 0340  WBC 9.4 10.7* 10.5  RBC 4.88 4.25 4.10  HGB 11.2* 9.7* 9.8*  HCT 37.7 32.5* 31.8*  MCV 77.3* 76.5* 77.6*  MCH 23.0* 22.8* 23.9*  MCHC 29.7* 29.8* 30.8  RDW 17.3* 16.9* 17.4*  PLT 259 258 237    Cardiac Enzymes Recent Labs Lab 07/30/16 1542 08/03/16 1925 08/04/16 1119 08/04/16 1746  TROPONINI 0.13* 2.51* 10.19* 12.53*   No results for input(s): TROPIPOC in the last 168 hours.   BNPNo results for input(s): BNP, PROBNP in the last 168 hours.   DDimer No results for input(s): DDIMER in the last 168 hours.   Radiology    Ct Head Wo Contrast  Result Date: 08/04/2016 CLINICAL DATA:  83 year old female with a history of confusion and delirium EXAM: CT HEAD WITHOUT CONTRAST TECHNIQUE: Contiguous axial images were obtained from the base of the skull through the vertex without intravenous contrast. COMPARISON:  11/17/2009, 03/19/2009, MR 03/16/2009 FINDINGS: Brain: No acute intracranial hemorrhage. No midline shift or mass effect. Encephalomalacia of the left MCA territory related to prior infarction. There is increasing hypodensity in the white matter in the left perisylvian region. Gray-white differentiation is relatively maintained. Unremarkable configuration the ventricles. Calcifications of the intracranial vasculature. Vascular: Redemonstration of the known right paraclinoid aneurysm, which is similar and diameter to the comparison,  Approximately 15 mm x 12 mm on the current. Hyperdense appearance of the aneurysm on the current. Relatively similar appearance of known left supraclinoid aneurysm. Skull: No displaced fracture.  No progressive bony lesion. Sinuses/Orbits: Unremarkable appearance of the paranasal sinuses and orbits. Other: None IMPRESSION: No CT evidence of acute intracranial abnormality. Re- demonstration of known right and left para clinoid aneurysm. The largest is on the right, with relatively unchanged maximal diameter of approximately 15 mm. There is hyperdense appearance of the aneurysm, which may represent internal thrombus, and if there were concern for acute right MCA infarction, CTA may be considered as a more sensitive test, as well as consideration of neurology consultation. Left sided encephalomalacia related to prior left MCA infarction. There is progression of white matter disease in the left perisylvian region. These results were called by telephone at the time of interpretation on 08/04/2016 at 11:57 am to the nurse caring for the patient, Ms Brynda Greathouse who verbally acknowledged these results. Electronically Signed   By: Corrie Mckusick D.O.   On: 08/04/2016 12:02   Dg Chest Port 1 View  Result Date: 08/04/2016 CLINICAL DATA:  Hypoxia EXAM: PORTABLE CHEST 1 VIEW COMPARISON:  07/29/2016 FINDINGS: There is bilateral interstitial thickening. There is left mid lung airspace disease consistent with atelectasis. There are bilateral small pleural effusions. There is no pneumothorax. There is severe cardiomegaly. The osseous structures are unremarkable. IMPRESSION: 1. Findings concerning for mild CHF. Electronically Signed   By: Kathreen Devoid   On: 08/04/2016 11:33    Cardiac Studies   ECHO pending  Patient Profile     83 y.o. female with permenant afib not on AC, CAD (CTO RCA no PCI), CVA, HTN, dCHF.now admitted.  Last cath in 2002.  Now with NSTEMI and troponin of 12.53  Pt is now DNR and palliative care has  seen.   Assessment & Plan    NSTEMI Pt with robust elevation in troponin of 12.53.  She has known CTO of the RCA.  She has minimal symptoms.  I worry about perioperative risks however.   Medical management is advised.  Echo with normal LVF and moderate pulmonary HTN.  No plans for cath or other testing.  Will not use heparin given recent fall and risks of anticoagulation.  Fall- displaced Rt. Femoral  neck fracture - Sounds mechanical, no concern for syncope  Afib - Permanent, not anticoagulation candidate per previous notes. Currently rate controlled on diltiazem drip. As pain gets better controlled, would switch back to home oral medications. Note: patient previously developed heart block while on digoxin, would avoid.  she is on IV metoprolol also not on POs currently.  Afib is rate controlled and if can take PO would transition to PO.   Pre operative risk assessment  Per Dr. Bonita Quin conversation with family this weekend -"Given advanced age, delerium, NSTEMI/CAD, and afib, and prior stroke, she is at high risk for any procedures.  With confusion, recovery from hip surgery would be tough. Do  not feel that any further CV testing would reduce her risks.  They are aware that risks are increased. Their primary concern is keeping her comfortable. Family has met with palliative care. The patients son reports that she has been "ready to die" for a year and has been talking about it a lot for the past few weeks and even last night.  They do not wish to put her through anything that she would not want to be subjected to.  Anemia Hgb 9.8   CKD -- 4 today 1.88 up from 1.52  DNR - palliative care has seen .  Family has met with Palliative care and wish to make full comfort care.  Will sign off please call if we can help.  Signed, Cecilie Kicks, NP  08/06/2016, 8:42 AM    Discussed with Dr. Carles Collet this am.   Family has mad decision for hospice and full comfort care.  No need to see patient. I did  not personally see patient today as Dr. Carles Collet felt no further cardiac input needed.  Will sign off.    Signed: Fransico Him, MD [facility] 08/06/2016

## 2016-08-06 NOTE — Progress Notes (Signed)
CSW consulted to assist with d/c planning ( SNF vs residential hospice ). No PT eval ordered.  Palliative Care Team is following. CSW is available to assist with d/c planning once disposition has been determined.   Cori Razor LCSW [phone]

## 2016-08-06 NOTE — Progress Notes (Signed)
Patient ID: [name], female   DOB: July 02, 1917, 83 y.o.   MRN: [id number]  Aware of conditions effecting patient and hence decision process. Agree with level of risk associated with surgery at this point.  If however her condition stabilizes or improves please feel free to contact me directly at [phone] and we can re-engage with care plan of hip hemiarthroplasty to manage her right femoral neck fracture

## 2016-08-06 NOTE — Progress Notes (Signed)
SLP Cancellation Note  Patient Details Name: [name] MRN: [id number] DOB: 1917-05-12   Cancelled treatment:       Reason Eval/Treat Not Completed: Medical issues which prohibited therapy;Fatigue/lethargy limiting ability to participate (per RN, pt either lethargic or alert and agitated, not asking for po per RN, palliative f/u this pm per RN, will continue efforts)  Donavan Burnet, MS [facility] SLP [phone]

## 2016-08-06 NOTE — Progress Notes (Addendum)
PROGRESS NOTE  [name] ZOX:[id number] DOB: 13-Sep-1917 DOA: 08/03/2016 PCP: Carylon Perches, MD  Brief History:  83 year old female with a history of permanent atrial fibrillation, diastolic CHF, hypertension, coronary artery disease, stroke, iron deficiency anemia, hyperlipidemia presented after a mechanical fall at home. The patient was recently hospitalized from 07/29/2016 through 08/02/2016 for left knee pain and joint effusion as well as UTI. The patient was discharged home in stable condition with cephalexin. For herleft knee effusion, arthrocentesis was performed and showed WBC 9405with negative cultures and negative crystals. It was felt this was likely due to osteoarthritis. According to the family, the patient developed delirium and confusion during her last hospitalization. She remained confused after discharge home. As result, the patient fell trying to get out of bed resulting in a right displaced femoral neck fracture. The patient was initially sent to the ED at [facility].She was subsequently transferred to [facility] she developed atrial fibrillation with RVR and elevated troponin up to 2.51. Orthopedics and cardiology were consulted to assist with management. She was started on a diltiazem drip with gradual improvement of her heart rate. The patient's troponin continue to trend up secondary to NSTEMI. She was not felt to be a candidate for invasive intervention.  Furthermore, the patient did not wish to pursue heart cath.  She was deemed to be high/unacceptable risk for surgery.  As a result, orthopedic surgery was postponed/cancelled.  Assessment/Plan: Atrial fibrillation with RVR -Appreciate cardiology consult-->high risk for surgery, but no further interventions will alter the risk -Patient has a history of permanent atrial fibrillation -Not a candidate for anticoagulation secondary to fall risk as documented in previous medical record -Convert metoprolol IV  Lopressor until patient is able to take posafely -TSH, Free T4--consistent with euthyroid sick syndrome  Elevated troponin/NSTEMI -troponin peaked at 12.53 -EKG--atrial fibrillation with nonspecific ST-T wave changes -Case discussed with cardiology, Dr. Lucilla Edin is high risk for surgery, no further intervention will alter that risk -known CTO RCA and collateralized territory. -08/06/2016 at EF 55-60%, PASP 51, moderate TR, mild AI  Displaced right femoral neck fracture -Appreciate orthopedics consult -Case discussed with cardiology, Dr. Lucilla Edin is high risk for surgery, , no further intervention will alter that risk -agree with palliative medicine consultation -pain control  Acute on chronic renal failure--CKD stage 3 -due to hemodynamic changes from NSTEMI and volume depletion -judicious IVF -baseline creatinine 1.0-1.3 -am BMP  Acute respiratory failure with hypoxia -CXR -in part due to hypoventilation from opioids -remains on 6 L [place] -ABG  Chronic diastolic CHF -Appears clinically euvolemic -Daily weights -Judicious IV fluid -Continue metoprolol  Acute metabolic encephalopathy -Multifactorial including opioids, hospital delirium, renal failure -B12--551 -TSH--3.272 -Free T4--1.29 -ammonia--16 -RPR--NR -CT brain--known right and left supraclinoid aneurysms unchanged diameter. Hyperdense appearance of aneurysm; otherwise no acute changes -08/05/16--more alert  -speech therapy evaluation-->npo except meds  Hyperkalemia -Discontinue losartan -improved  Essential hypertension -Continue Lopressor and diltiazem IV -Switched to poonce able to tolerate by mouth safely  Iron Deficiency Anemia -restart po iron once safe to take po  Goals of Care -pt is DNR -consulted palliative care medicine for GOC and to help with disposition -case discussed with Dr. Neale Burly     Disposition Plan:   SNF vs residential hospice  Family Communication:   Son and  daughter updated at bedside--Total time spent 35 minutes.  Greater than 50% spent face to face counseling and coordinating care.  Consultants: Cardiology, [facility], palliative medicine  Code Status: FULL   DVT Prophylaxis: [place] Heparin    Procedures: As Listed in Progress Note Above  Antibiotics: Ceftriaxone 4/27>>>4/29    Subjective: Patient is comfortable lying in bed but has severe pain with any type of movement in her right lower extremity. She denies any headache, chest pain, short of breath, nausea, vomiting, abdominal pain. No reports of respiratory distress or uncontrolled pain.  Objective: Vitals:   08/06/16 0733 08/06/16 0800 08/06/16 0900 08/06/16 1000  BP:  138/70  128/60  Pulse: 82 83  90  Resp: (!) (!) 8  Temp: 97.7 F (36.5 C)     TempSrc: Oral     SpO2: 97% 99%  90%  Weight:      Height:        Intake/Output Summary (Last 24 hours) at 08/06/16 1044 Last data filed at 08/06/16 0900  Gross per 24 hour  Intake              575 ml  Output              300 ml  Net              275 ml   Weight change:  Exam:   General:  Pt is alert, does not follow commands appropriately, not in acute distress  HEENT: No icterus, No thrush, No neck mass, [place]/AT  Cardiovascular: IRRR, S1/S2, no rubs, no gallops  Respiratory: Bibasilar crackles with diminished breath sounds at the bases. Poor inspiratory effort.  Abdomen: Soft/+BS, non tender, non distended, no guarding  Extremities: No edema, No lymphangitis, No petechiae, No rashes, no synovitis   Data Reviewed: I have personally reviewed following labs and imaging studies Basic Metabolic Panel:  Recent Labs Lab 08/01/16 0618 08/03/16 1533 08/04/16 0401 08/05/16 0349 08/06/16 0340  NA 135 133* 136 135 137  K 5.1 5.3* 5.2* 4.2 4.5  CL 102 98* 102 106 107  CO2 20* 21*  GLUCOSE 101* 140* 137* 131* 121*  BUN 31* 27* 24* 27* 36*  CREATININE 1.56* 1.41* 1.36* 1.52* 1.88*  CALCIUM  8.4* 9.2 8.9 8.0* 8.2*  MG  --   --  2.1  --  2.2   Liver Function Tests:  Recent Labs Lab 08/03/16 1533  AST 43*  ALT 19  ALKPHOS 75  BILITOT 0.8  PROT 7.2  ALBUMIN 3.3*   No results for input(s): LIPASE, AMYLASE in the last 168 hours.  Recent Labs Lab 08/04/16 1119  AMMONIA 16   Coagulation Profile:  Recent Labs Lab 08/03/16 2241  INR 1.29   CBC:  Recent Labs Lab 08/03/16 1533 08/05/16 0349 08/06/16 0340  WBC 9.4 10.7* 10.5  NEUTROABS 8.2*  --   --   HGB 11.2* 9.7* 9.8*  HCT 37.7 32.5* 31.8*  MCV 77.3* 76.5* 77.6*  PLT 259 258 237   Cardiac Enzymes:  Recent Labs Lab 07/30/16 1542 08/03/16 1925 08/04/16 1119 08/04/16 1746  TROPONINI 0.13* 2.51* 10.19* 12.53*   BNP: Invalid input(s): POCBNP CBG:  Recent Labs Lab 07/31/16 0748 08/01/16 0747 08/02/16 0733  GLUCAP 104* 96 96   HbA1C: No results for input(s): HGBA1C in the last 72 hours. Urine analysis:    Component Value Date/Time   COLORURINE YELLOW 08/03/2016 1818   APPEARANCEUR CLEAR 08/03/2016 1818   LABSPEC 1.016 08/03/2016 1818   PHURINE 6.0 08/03/2016 1818   GLUCOSEU NEGATIVE 08/03/2016 1818   HGBUR SMALL (A) 08/03/2016 1818   BILIRUBINUR NEGATIVE 08/03/2016  1818   KETONESUR NEGATIVE 08/03/2016 1818   PROTEINUR 100 (A) 08/03/2016 1818   UROBILINOGEN 1.0 08/10/2014 1441   NITRITE NEGATIVE 08/03/2016 1818   LEUKOCYTESUR NEGATIVE 08/03/2016 1818   Sepsis Labs: (procalcitonin:4,lacticidven:4) ) Recent Results (from the past 240 hour(s))  Urine culture     Status: Abnormal   Collection Time: 07/29/16 10:30 PM  Result Value Ref Range Status   Specimen Description URINE, CATHETERIZED  Final   Special Requests NONE  Final   Culture >=100,000 COLONIES/mL ESCHERICHIA COLI (A)  Final   Report Status 08/01/2016 FINAL  Final   Organism ID, Bacteria ESCHERICHIA COLI (A)  Final      Susceptibility   Escherichia coli - MIC*    AMPICILLIN RESISTANT Resistant     CEFAZOLIN  <=4 SENSITIVE Sensitive     CEFTRIAXONE <=1 SENSITIVE Sensitive     CIPROFLOXACIN <=0.25 SENSITIVE Sensitive     GENTAMICIN <=1 SENSITIVE Sensitive     IMIPENEM <=0.25 SENSITIVE Sensitive     NITROFURANTOIN <=16 SENSITIVE Sensitive     TRIMETH/SULFA <=20 SENSITIVE Sensitive     AMPICILLIN/SULBACTAM 4 SENSITIVE Sensitive     PIP/TAZO 32 INTERMEDIATE Intermediate     Extended ESBL NEGATIVE Sensitive     * >=100,000 COLONIES/mL ESCHERICHIA COLI  Body fluid culture     Status: None   Collection Time: 07/30/16  5:00 PM  Result Value Ref Range Status   Specimen Description SYNOVIAL LEFT KNEE  Final   Special Requests NONE  Final   Gram Stain   Final    CYTOSPIN SMEAR NO ORGANISMS SEEN WBC PRESENT, PREDOMINANTLY PMN    Culture   Final    NO GROWTH 3 DAYS Performed at [facility], [address] [place], [place] [address]    Report Status 08/03/2016 FINAL  Final  Gram stain     Status: None   Collection Time: 07/30/16  5:00 PM  Result Value Ref Range Status   Specimen Description SYNOVIAL LEFT KNEE  Final   Special Requests NONE  Final   Gram Stain   Final    CYTOSPIN SMEAR NO ORGANISMS SEEN WBC PRESENT, PREDOMINANTLY PMN    Report Status 07/30/2016 FINAL  Final  MRSA PCR Screening     Status: None   Collection Time: 08/03/16  9:48 PM  Result Value Ref Range Status   MRSA by PCR NEGATIVE NEGATIVE Final    Comment:        The GeneXpert MRSA Assay (FDA approved for NASAL specimens only), is one component of a comprehensive MRSA colonization surveillance program. It is not intended to diagnose MRSA infection nor to guide or monitor treatment for MRSA infections.      Scheduled Meds: . acetaminophen  650 mg Oral Q6H  . aspirin  325 mg Oral Daily  . chlorhexidine  15 mL Mouth Rinse BID  . heparin subcutaneous  5,000 Units Subcutaneous Q8H  . mouth rinse  15 mL Mouth Rinse q12n4p  . metoprolol  2.5 mg Intravenous Q6H  . oxyCODONE  5 mg Oral Q6H  . pantoprazole  (PROTONIX) IV  40 mg Intravenous Q24H   Continuous Infusions: . sodium chloride Stopped (08/04/16 1033)  . diltiazem (CARDIZEM) infusion 15 mg/hr (08/06/16 0907)  . sodium chloride 0.9 % 1,000 mL infusion      Procedures/Studies: Dg Knee 1-2 Views Left  Result Date: 07/30/2016 CLINICAL DATA:  83 year old female with bilateral lower extremity swelling and left knee pain. EXAM: LEFT KNEE - 1-2 VIEW  COMPARISON:  Left knee radiograph dated 08/12/2014 FINDINGS: There is no acute fracture or dislocation. The bones are osteopenic. There is severe osteoarthritic changes of the knee with tricompartmental narrowing most severely involving the lateral compartment. There is a moderate size suprapatellar effusion. There is vascular atherosclerotic disease. IMPRESSION: 1. No acute fracture or dislocation. 2. Severe osteoarthritis. 3. Moderate size suprapatellar effusion. Electronically Signed   By: Elgie Collard M.D.   On: 07/30/2016 01:04   Ct Head Wo Contrast  Result Date: 08/04/2016 CLINICAL DATA:  83 year old female with a history of confusion and delirium EXAM: CT HEAD WITHOUT CONTRAST TECHNIQUE: Contiguous axial images were obtained from the base of the skull through the vertex without intravenous contrast. COMPARISON:  11/17/2009, 03/19/2009, MR 03/16/2009 FINDINGS: Brain: No acute intracranial hemorrhage. No midline shift or mass effect. Encephalomalacia of the left MCA territory related to prior infarction. There is increasing hypodensity in the white matter in the left perisylvian region. Gray-white differentiation is relatively maintained. Unremarkable configuration the ventricles. Calcifications of the intracranial vasculature. Vascular: Redemonstration of the known right paraclinoid aneurysm, which is similar and diameter to the comparison, Approximately 15 mm x 12 mm on the current. Hyperdense appearance of the aneurysm on the current. Relatively similar appearance of known left supraclinoid  aneurysm. Skull: No displaced fracture.  No progressive bony lesion. Sinuses/Orbits: Unremarkable appearance of the paranasal sinuses and orbits. Other: None IMPRESSION: No CT evidence of acute intracranial abnormality. Re- demonstration of known right and left para clinoid aneurysm. The largest is on the right, with relatively unchanged maximal diameter of approximately 15 mm. There is hyperdense appearance of the aneurysm, which may represent internal thrombus, and if there were concern for acute right MCA infarction, CTA may be considered as a more sensitive test, as well as consideration of neurology consultation. Left sided encephalomalacia related to prior left MCA infarction. There is progression of white matter disease in the left perisylvian region. These results were called by telephone at the time of interpretation on 08/04/2016 at 11:57 am to the nurse caring for the patient, Ms Stevphen Meuse who verbally acknowledged these results. Electronically Signed   By: Gilmer Mor D.O.   On: 08/04/2016 12:02   Dg Chest Port 1 View  Result Date: 08/04/2016 CLINICAL DATA:  Hypoxia EXAM: PORTABLE CHEST 1 VIEW COMPARISON:  07/29/2016 FINDINGS: There is bilateral interstitial thickening. There is left mid lung airspace disease consistent with atelectasis. There are bilateral small pleural effusions. There is no pneumothorax. There is severe cardiomegaly. The osseous structures are unremarkable. IMPRESSION: 1. Findings concerning for mild CHF. Electronically Signed   By: Elige Ko   On: 08/04/2016 11:33   Dg Chest Port 1 View  Result Date: 07/29/2016 CLINICAL DATA:  Shortness of breast started 3 days ago. EXAM: PORTABLE CHEST 1 VIEW COMPARISON:  07/14/2012 FINDINGS: The cardio pericardial silhouette is enlarged. There is pulmonary vascular congestion without overt pulmonary edema. Stable opacity medial right apex. Degenerative changes left shoulder. Telemetry leads overlie the chest. IMPRESSION: 1.  Cardiomegaly with vascular congestion. 2. Right apical opacity. Follow-up dedicated PA and lateral chest x-ray, after resolution of acute symptoms, recommended to further evaluate. Electronically Signed   By: Kennith Center M.D.   On: 07/29/2016 22:02   Dg Hip Unilat W Or Wo Pelvis 2-3 Views Right  Result Date: 08/03/2016 CLINICAL DATA:  Hip pain after fall.  Initial encounter. EXAM: DG HIP (WITH OR WITHOUT PELVIS) 2-3V RIGHT COMPARISON:  None. FINDINGS: Right femoral neck fracture, transcervical to basicervical.  The right femoral head is located. No evidence of pelvic ring fracture. Osteopenia and atherosclerosis. Advanced lower lumbar disc degeneration. IMPRESSION: Displaced right femoral neck fracture. Electronically Signed   By: Marnee Spring M.D.   On: 08/03/2016 16:32    Ziara Thelander, DO  [facility] Pager [phone]  If 7PM-7AM, please contact night-coverage [email or web address] Password [email or web address] 08/06/2016, 10:44 AM   LOS: 3 days

## 2016-08-06 NOTE — Progress Notes (Signed)
Palliative care progress note  Reason for visit: Establish goals of care in light of hip fracture, NSTEMI; pain management  I met today with Ms. Robards' son and daughter.  We discussed her clinical course since we met yesterday, including continued decline of respiratory and renal function that is further impacted by recent NSTEMI and continued a-fib.  They have already discussed with Dr. Carles Collet this AM and appear to have a good understanding of her situation.  Her children are clear that, with her continued decline, her desire would be to focus on comfort and dignity as she approaches death without pursing further invasive testing or interventions.    Her daughter reports that patient was talking with her last night and told her that she is going to die, is not afraid, and is ready for death.  We discussed comfort care at length, and family would like to pursue FULL COMFORT CARE.  Will work to transition from ICU and monitor overnight to determine if she is stable enough to consider transition from the hospital to residential hospice facility.  Discussed with Dt. Tat who will update orders for transfer out of ICU.  Her family would like to pursue placement at [facility] residential hospice facility if she is stable enough to consider transfer.  I am going off service, but someone from PMT will follow-up tomorrow to evaluate symptom management and potential transfer.  Time spent: 30 minutes Greater than 50%  of this time was spent counseling and coordinating care related to the above assessment and plan.  Micheline Rough, MD [place] [facility] [phone]

## 2016-08-07 DIAGNOSIS — R52 Pain, unspecified: Secondary | ICD-10-CM

## 2016-08-07 DIAGNOSIS — Z515 Encounter for palliative care: Secondary | ICD-10-CM

## 2016-08-07 LAB — BASIC METABOLIC PANEL
Anion gap: 10 (ref 5–15)
BUN: 47 mg/dL — AB (ref 6–20)
CO2: 22 mmol/L (ref 22–32)
CREATININE: 1.82 mg/dL — AB (ref 0.44–1.00)
Calcium: 8.3 mg/dL — ABNORMAL LOW (ref 8.9–10.3)
Chloride: 109 mmol/L (ref 101–111)
GFR calc Af Amer: 26 mL/min — ABNORMAL LOW (ref >60)
GFR, EST NON AFRICAN AMERICAN: 22 mL/min — AB (ref >60)
Glucose, Bld: 104 mg/dL — ABNORMAL HIGH (ref 65–99)
POTASSIUM: 4.5 mmol/L (ref 3.5–5.1)
SODIUM: 141 mmol/L (ref 135–145)

## 2016-08-07 MED ORDER — HALOPERIDOL LACTATE 5 MG/ML IJ SOLN
5.0000 mg | Freq: Once | INTRAMUSCULAR | Status: AC
  Administered 2016-08-07: 5 mg via INTRAMUSCULAR
  Filled 2016-08-07: qty 1

## 2016-08-07 MED ORDER — DILTIAZEM HCL ER COATED BEADS 120 MG PO CP24: 120.0000 mg | ORAL_CAPSULE | Freq: Every day | ORAL | Status: DC

## 2016-08-07 MED ORDER — OXYCODONE HCL 5 MG PO TABS: 5.0000 mg | ORAL_TABLET | Freq: Four times a day (QID) | ORAL | 0 refills | Status: AC

## 2016-08-07 MED ORDER — MORPHINE SULFATE (CONCENTRATE) 10 MG /0.5 ML PO SOLN: 5.0000 mg | ORAL | 0 refills | Status: AC | PRN

## 2016-08-07 NOTE — Discharge Summary (Signed)
Physician Discharge Summary  [name]:[id number] DOB: October 06, 1917 DOA: 08/03/2016  PCP: Carylon Perches, MD  Admit date: 08/03/2016 Discharge date: 08/07/2016  Admitted From: Home Disposition:  Residential Hospice--[facility]) residential hospice     Discharge Condition: Stable CODE STATUS: FULL COMFORT Diet recommendation: Comfort feeding   Brief/Interim Summary: 83 year old female with a history of permanent atrial fibrillation, diastolic CHF, hypertension, coronary artery disease, stroke, iron deficiency anemia, hyperlipidemia presented after a mechanical fall at home. The patient was recently hospitalized from 07/29/2016 through 08/02/2016 for left knee pain and joint effusion as well as UTI. The patient was discharged home in stable condition with cephalexin. For herleft knee effusion, arthrocentesis was performed and showed WBC 9405with negative cultures and negative crystals. It was felt this was likely due to osteoarthritis. According to the family, the patient developed delirium and confusion during her last hospitalization. She remained confused after discharge home. As result, the patient fell trying to get out of bed resulting in a right displaced femoral neck fracture. The patient was initially sent to the ED at [facility].She was subsequently transferred to [facility] she developed atrial fibrillation with RVR and elevated troponin up to 2.51. Orthopedics and cardiology were consulted to assist with management. She was started on a diltiazem drip with gradual improvement of her heart rate. The patient's troponin continue to trend up secondary to NSTEMI. She was not felt to be a candidate for invasive intervention.  Furthermore, the patient did not wish to pursue heart cath.  She was deemed to be high/unacceptable risk for surgery.  As a result, orthopedic surgery was postponed/cancelled.  Palliative medicine was consulted.  After a number of family  meetings, a decision was made by the family to transition the patient's focus of care to focus on FULL COMFORT.  Discharge Diagnoses:  Atrial fibrillation with RVR -Appreciate cardiology consult-->high risk for surgery, but no further interventions will alter the risk -Patient has a history of permanent atrial fibrillation -Not a candidate for anticoagulation secondary to fall risk as documented in previous medical record -Convert metoprolol IV Lopressor until patient is able to take posafely -TSH, Free T4--consistent with euthyroid sick syndrome  Elevated troponin/NSTEMI -troponin peaked at 12.53 -EKG--atrial fibrillation with nonspecific ST-T wave changes -Case discussed with cardiology, Dr. Lucilla Edin is high risk for surgery, no further intervention will alter that risk -known CTO RCA and collateralized territory. -08/06/2016 at EF 55-60%, PASP 51, moderate TR, mild AI -no longer active issue as patient's focus of care transitioned to focus on FULL COMFORT  Displaced right femoral neck fracture -Appreciate orthopedics consult -Case discussed with cardiology, Dr. Lucilla Edin is high risk for surgery, , no further intervention will alter that risk -agree with palliative medicine consultation-->multiple family meetings-->FULL COMFORT -pain control -no longer active issue as patient's focus of care transitioned to focus on FULL COMFORT  Acute on chronic renal failure--CKD stage 3 -due to hemodynamic changes from NSTEMI and volume depletion -judicious IVF -baseline creatinine 1.0-1.3 -d/c losartan -no longer active issue as patient's focus of care transitioned to focus on FULL COMFORT  Acute respiratory failure with hypoxia -CXR -in part due to hypoventilation from opioids -remains on 6 L [place] -ABG--cancelled as pt transitioned to full comfort -no longer active issue as patient's focus of care transitioned to focus on FULL COMFORT  Chronic diastolic CHF -Appears  clinically euvolemic -Daily weights -Judicious IV fluid -Continue metoprolol -no longer active issue as patient's focus of care transitioned to focus on FULL COMFORT  Acute  metabolic encephalopathy -Multifactorial including opioids, hospital delirium, renal failure -B12--551 -TSH--3.272 -Free T4--1.29 -ammonia--16 -RPR--NR -CT brain--known right and left supraclinoid aneurysms unchanged diameter. Hyperdense appearance of aneurysm; otherwise no acute changes -08/05/16--more alert  -speech therapy evaluation-->npo except meds -no longer active issue as patient's focus of care transitioned to focus on FULL COMFORT -allow comfort feeding  Hyperkalemia -Discontinue losartan -improved  Essential hypertension -Continue Lopressor and diltiazem IV -Switched to poonce able to tolerate by mouth safely -no longer active issue as patient's focus of care transitioned to focus on FULL COMFORT  Iron Deficiency Anemia -restart po iron once safe to take po  Goals of Care -pt is DNR/FULL COMFORT -consulted palliative care medicine for GOC and to help with disposition -case discussed with Dr. Neale Burly -Family desires [name]) residential hospice -expected life expectancy on current trajectory--days to weeks   Discharge Instructions  Discharge Instructions    Diet - low sodium heart healthy    Complete by:  As directed    Increase activity slowly    Complete by:  As directed      Allergies as of 08/07/2016      Reactions   Ciprofloxacin Rash      Medication List    STOP taking these medications   aspirin EC 325 MG tablet   cephALEXin 500 MG capsule Commonly known as:  KEFLEX   ferrous sulfate 325 (65 FE) MG tablet   furosemide 20 MG tablet Commonly known as:  LASIX   guaiFENesin-dextromethorphan 100-10 MG/5ML syrup Commonly known as:  ROBITUSSIN DM   losartan 100 MG tablet Commonly known as:  COZAAR   metoprolol 50 MG tablet Commonly known  as:  LOPRESSOR   pantoprazole 40 MG tablet Commonly known as:  PROTONIX   predniSONE 5 MG tablet Commonly known as:  DELTASONE     TAKE these medications   diltiazem 240 MG 24 hr capsule Commonly known as:  CARDIZEM CD Take 1 capsule (240 mg total) by mouth daily. What changed:  how much to take  when to take this   morphine CONCENTRATE 10 mg / 0.5 ml concentrated solution Take 0.25 mLs (5 mg total) by mouth every 2 (two) hours as needed for severe pain.   oxyCODONE 5 MG immediate release tablet Commonly known as:  Oxy IR/ROXICODONE Take 1 tablet (5 mg total) by mouth every 6 (six) hours.   polyethylene glycol packet Commonly known as:  MIRALAX / GLYCOLAX Take 17 g by mouth daily as needed.       Allergies  Allergen Reactions  . Ciprofloxacin Rash    Consultations:  Ortho--Dr. Durene Romans  Palliative medicine  Cardiology   Procedures/Studies: Dg Knee 1-2 Views Left  Result Date: 07/30/2016 CLINICAL DATA:  83 year old female with bilateral lower extremity swelling and left knee pain. EXAM: LEFT KNEE - 1-2 VIEW COMPARISON:  Left knee radiograph dated 08/12/2014 FINDINGS: There is no acute fracture or dislocation. The bones are osteopenic. There is severe osteoarthritic changes of the knee with tricompartmental narrowing most severely involving the lateral compartment. There is a moderate size suprapatellar effusion. There is vascular atherosclerotic disease. IMPRESSION: 1. No acute fracture or dislocation. 2. Severe osteoarthritis. 3. Moderate size suprapatellar effusion. Electronically Signed   By: Elgie Collard M.D.   On: 07/30/2016 01:04   Ct Head Wo Contrast  Result Date: 08/04/2016 CLINICAL DATA:  83 year old female with a history of confusion and delirium EXAM: CT HEAD WITHOUT CONTRAST TECHNIQUE: Contiguous axial images were obtained from the base  of the skull through the vertex without intravenous contrast. COMPARISON:  11/17/2009, 03/19/2009, MR  03/16/2009 FINDINGS: Brain: No acute intracranial hemorrhage. No midline shift or mass effect. Encephalomalacia of the left MCA territory related to prior infarction. There is increasing hypodensity in the white matter in the left perisylvian region. Gray-white differentiation is relatively maintained. Unremarkable configuration the ventricles. Calcifications of the intracranial vasculature. Vascular: Redemonstration of the known right paraclinoid aneurysm, which is similar and diameter to the comparison, Approximately 15 mm x 12 mm on the current. Hyperdense appearance of the aneurysm on the current. Relatively similar appearance of known left supraclinoid aneurysm. Skull: No displaced fracture.  No progressive bony lesion. Sinuses/Orbits: Unremarkable appearance of the paranasal sinuses and orbits. Other: None IMPRESSION: No CT evidence of acute intracranial abnormality. Re- demonstration of known right and left para clinoid aneurysm. The largest is on the right, with relatively unchanged maximal diameter of approximately 15 mm. There is hyperdense appearance of the aneurysm, which may represent internal thrombus, and if there were concern for acute right MCA infarction, CTA may be considered as a more sensitive test, as well as consideration of neurology consultation. Left sided encephalomalacia related to prior left MCA infarction. There is progression of white matter disease in the left perisylvian region. These results were called by telephone at the time of interpretation on 08/04/2016 at 11:57 am to the nurse caring for the patient, Ms Stevphen Meuse who verbally acknowledged these results. Electronically Signed   By: Gilmer Mor D.O.   On: 08/04/2016 12:02   Dg Chest Port 1 View  Result Date: 08/06/2016 CLINICAL DATA:  Acute respiratory failure EXAM: PORTABLE CHEST 1 VIEW COMPARISON:  07/27/2016 FINDINGS: Stable enlarged cardiac silhouette. Prominent superior vena cava shadow along the RIGHT upper  mediastinum is similar prior. Small LEFT effusion and basilar atelectasis noted. Lungs are hyperinflated. Degenerate changes shoulders. IMPRESSION: 1. Cardiomegaly and small LEFT effusion with LEFT lower lobe atelectasis. 2. Hyperinflated lungs. Electronically Signed   By: Genevive Bi M.D.   On: 08/06/2016 11:07   Dg Chest Port 1 View  Result Date: 08/04/2016 CLINICAL DATA:  Hypoxia EXAM: PORTABLE CHEST 1 VIEW COMPARISON:  07/29/2016 FINDINGS: There is bilateral interstitial thickening. There is left mid lung airspace disease consistent with atelectasis. There are bilateral small pleural effusions. There is no pneumothorax. There is severe cardiomegaly. The osseous structures are unremarkable. IMPRESSION: 1. Findings concerning for mild CHF. Electronically Signed   By: Elige Ko   On: 08/04/2016 11:33   Dg Chest Port 1 View  Result Date: 07/29/2016 CLINICAL DATA:  Shortness of breast started 3 days ago. EXAM: PORTABLE CHEST 1 VIEW COMPARISON:  07/14/2012 FINDINGS: The cardio pericardial silhouette is enlarged. There is pulmonary vascular congestion without overt pulmonary edema. Stable opacity medial right apex. Degenerative changes left shoulder. Telemetry leads overlie the chest. IMPRESSION: 1. Cardiomegaly with vascular congestion. 2. Right apical opacity. Follow-up dedicated PA and lateral chest x-ray, after resolution of acute symptoms, recommended to further evaluate. Electronically Signed   By: Kennith Center M.D.   On: 07/29/2016 22:02   Dg Hip Unilat W Or Wo Pelvis 2-3 Views Right  Result Date: 08/03/2016 CLINICAL DATA:  Hip pain after fall.  Initial encounter. EXAM: DG HIP (WITH OR WITHOUT PELVIS) 2-3V RIGHT COMPARISON:  None. FINDINGS: Right femoral neck fracture, transcervical to basicervical. The right femoral head is located. No evidence of pelvic ring fracture. Osteopenia and atherosclerosis. Advanced lower lumbar disc degeneration. IMPRESSION: Displaced right femoral neck fracture.  Electronically  Signed   By: Marnee Spring M.D.   On: 08/03/2016 16:32        Discharge Exam: Vitals:   08/06/16 2045 08/07/16 0520  BP: 131/76 110/68  Pulse: 98 91  Resp: 16 16  Temp: 97.4 F (36.3 C) 97.5 F (36.4 C)   Vitals:   08/06/16 1400 08/06/16 1500 08/06/16 2045 08/07/16 0520  BP: (!) 113/48 122/73 131/76 110/68  Pulse: 75 78 98 91  Resp: 16 18 16 16   Temp:  98 F (36.7 C) 97.4 F (36.3 C) 97.5 F (36.4 C)  TempSrc:  Oral Oral Oral  SpO2: 94%  99% 98%  Weight:    82.3 kg (181 lb 7 oz)  Height:        General: Pt is alert, awake, not in acute distress Cardiovascular: RRR, S1/S2 +, no rubs, no gallops Respiratory: bibasilar rales, no wheeze Abdominal: Soft, NT, ND, bowel sounds + Extremities: no edema, no cyanosis   The results of significant diagnostics from this hospitalization (including imaging, microbiology, ancillary and laboratory) are listed below for reference.    Significant Diagnostic Studies: Dg Knee 1-2 Views Left  Result Date: 07/30/2016 CLINICAL DATA:  83 year old female with bilateral lower extremity swelling and left knee pain. EXAM: LEFT KNEE - 1-2 VIEW COMPARISON:  Left knee radiograph dated 08/12/2014 FINDINGS: There is no acute fracture or dislocation. The bones are osteopenic. There is severe osteoarthritic changes of the knee with tricompartmental narrowing most severely involving the lateral compartment. There is a moderate size suprapatellar effusion. There is vascular atherosclerotic disease. IMPRESSION: 1. No acute fracture or dislocation. 2. Severe osteoarthritis. 3. Moderate size suprapatellar effusion. Electronically Signed   By: Elgie Collard M.D.   On: 07/30/2016 01:04   Ct Head Wo Contrast  Result Date: 08/04/2016 CLINICAL DATA:  83 year old female with a history of confusion and delirium EXAM: CT HEAD WITHOUT CONTRAST TECHNIQUE: Contiguous axial images were obtained from the base of the skull through the vertex without  intravenous contrast. COMPARISON:  11/17/2009, 03/19/2009, MR 03/16/2009 FINDINGS: Brain: No acute intracranial hemorrhage. No midline shift or mass effect. Encephalomalacia of the left MCA territory related to prior infarction. There is increasing hypodensity in the white matter in the left perisylvian region. Gray-white differentiation is relatively maintained. Unremarkable configuration the ventricles. Calcifications of the intracranial vasculature. Vascular: Redemonstration of the known right paraclinoid aneurysm, which is similar and diameter to the comparison, Approximately 15 mm x 12 mm on the current. Hyperdense appearance of the aneurysm on the current. Relatively similar appearance of known left supraclinoid aneurysm. Skull: No displaced fracture.  No progressive bony lesion. Sinuses/Orbits: Unremarkable appearance of the paranasal sinuses and orbits. Other: None IMPRESSION: No CT evidence of acute intracranial abnormality. Re- demonstration of known right and left para clinoid aneurysm. The largest is on the right, with relatively unchanged maximal diameter of approximately 15 mm. There is hyperdense appearance of the aneurysm, which may represent internal thrombus, and if there were concern for acute right MCA infarction, CTA may be considered as a more sensitive test, as well as consideration of neurology consultation. Left sided encephalomalacia related to prior left MCA infarction. There is progression of white matter disease in the left perisylvian region. These results were called by telephone at the time of interpretation on 08/04/2016 at 11:57 am to the nurse caring for the patient, Ms Stevphen Meuse who verbally acknowledged these results. Electronically Signed   By: Gilmer Mor D.O.   On: 08/04/2016 12:02   Dg Chest  Port 1 View  Result Date: 08/06/2016 CLINICAL DATA:  Acute respiratory failure EXAM: PORTABLE CHEST 1 VIEW COMPARISON:  07/27/2016 FINDINGS: Stable enlarged cardiac silhouette.  Prominent superior vena cava shadow along the RIGHT upper mediastinum is similar prior. Small LEFT effusion and basilar atelectasis noted. Lungs are hyperinflated. Degenerate changes shoulders. IMPRESSION: 1. Cardiomegaly and small LEFT effusion with LEFT lower lobe atelectasis. 2. Hyperinflated lungs. Electronically Signed   By: Genevive Bi M.D.   On: 08/06/2016 11:07   Dg Chest Port 1 View  Result Date: 08/04/2016 CLINICAL DATA:  Hypoxia EXAM: PORTABLE CHEST 1 VIEW COMPARISON:  07/29/2016 FINDINGS: There is bilateral interstitial thickening. There is left mid lung airspace disease consistent with atelectasis. There are bilateral small pleural effusions. There is no pneumothorax. There is severe cardiomegaly. The osseous structures are unremarkable. IMPRESSION: 1. Findings concerning for mild CHF. Electronically Signed   By: Elige Ko   On: 08/04/2016 11:33   Dg Chest Port 1 View  Result Date: 07/29/2016 CLINICAL DATA:  Shortness of breast started 3 days ago. EXAM: PORTABLE CHEST 1 VIEW COMPARISON:  07/14/2012 FINDINGS: The cardio pericardial silhouette is enlarged. There is pulmonary vascular congestion without overt pulmonary edema. Stable opacity medial right apex. Degenerative changes left shoulder. Telemetry leads overlie the chest. IMPRESSION: 1. Cardiomegaly with vascular congestion. 2. Right apical opacity. Follow-up dedicated PA and lateral chest x-ray, after resolution of acute symptoms, recommended to further evaluate. Electronically Signed   By: Kennith Center M.D.   On: 07/29/2016 22:02   Dg Hip Unilat W Or Wo Pelvis 2-3 Views Right  Result Date: 08/03/2016 CLINICAL DATA:  Hip pain after fall.  Initial encounter. EXAM: DG HIP (WITH OR WITHOUT PELVIS) 2-3V RIGHT COMPARISON:  None. FINDINGS: Right femoral neck fracture, transcervical to basicervical. The right femoral head is located. No evidence of pelvic ring fracture. Osteopenia and atherosclerosis. Advanced lower lumbar disc  degeneration. IMPRESSION: Displaced right femoral neck fracture. Electronically Signed   By: Marnee Spring M.D.   On: 08/03/2016 16:32     Microbiology: Recent Results (from the past 240 hour(s))  Urine culture     Status: Abnormal   Collection Time: 07/29/16 10:30 PM  Result Value Ref Range Status   Specimen Description URINE, CATHETERIZED  Final   Special Requests NONE  Final   Culture >=100,000 COLONIES/mL ESCHERICHIA COLI (A)  Final   Report Status 08/01/2016 FINAL  Final   Organism ID, Bacteria ESCHERICHIA COLI (A)  Final      Susceptibility   Escherichia coli - MIC*    AMPICILLIN RESISTANT Resistant     CEFAZOLIN <=4 SENSITIVE Sensitive     CEFTRIAXONE <=1 SENSITIVE Sensitive     CIPROFLOXACIN <=0.25 SENSITIVE Sensitive     GENTAMICIN <=1 SENSITIVE Sensitive     IMIPENEM <=0.25 SENSITIVE Sensitive     NITROFURANTOIN <=16 SENSITIVE Sensitive     TRIMETH/SULFA <=20 SENSITIVE Sensitive     AMPICILLIN/SULBACTAM 4 SENSITIVE Sensitive     PIP/TAZO 32 INTERMEDIATE Intermediate     Extended ESBL NEGATIVE Sensitive     * >=100,000 COLONIES/mL ESCHERICHIA COLI  Body fluid culture     Status: None   Collection Time: 07/30/16  5:00 PM  Result Value Ref Range Status   Specimen Description SYNOVIAL LEFT KNEE  Final   Special Requests NONE  Final   Gram Stain   Final    CYTOSPIN SMEAR NO ORGANISMS SEEN WBC PRESENT, PREDOMINANTLY PMN    Culture   Final    NO  GROWTH 3 DAYS Performed at [facility], [address], [place], [place] [address]    Report Status 08/03/2016 FINAL  Final  Gram stain     Status: None   Collection Time: 07/30/16  5:00 PM  Result Value Ref Range Status   Specimen Description SYNOVIAL LEFT KNEE  Final   Special Requests NONE  Final   Gram Stain   Final    CYTOSPIN SMEAR NO ORGANISMS SEEN WBC PRESENT, PREDOMINANTLY PMN    Report Status 07/30/2016 FINAL  Final  MRSA PCR Screening     Status: None   Collection Time: 08/03/16  9:48 PM  Result  Value Ref Range Status   MRSA by PCR NEGATIVE NEGATIVE Final    Comment:        The GeneXpert MRSA Assay (FDA approved for NASAL specimens only), is one component of a comprehensive MRSA colonization surveillance program. It is not intended to diagnose MRSA infection nor to guide or monitor treatment for MRSA infections.      Labs: Basic Metabolic Panel:  Recent Labs Lab 08/03/16 1533 08/04/16 0401 08/05/16 0349 08/06/16 0340 08/07/16 0335  NA 133* 136 135 137 141  K 5.3* 5.2* 4.2 4.5 4.5  CL 98* 102 106 107 109  CO2 24 22 20* 21* 22  GLUCOSE 140* 137* 131* 121* 104*  BUN 27* 24* 27* 36* 47*  CREATININE 1.41* 1.36* 1.52* 1.88* 1.82*  CALCIUM 9.2 8.9 8.0* 8.2* 8.3*  MG  --  2.1  --  2.2  --    Liver Function Tests:  Recent Labs Lab 08/03/16 1533  AST 43*  ALT 19  ALKPHOS 75  BILITOT 0.8  PROT 7.2  ALBUMIN 3.3*   No results for input(s): LIPASE, AMYLASE in the last 168 hours.  Recent Labs Lab 08/04/16 1119  AMMONIA 16   CBC:  Recent Labs Lab 08/03/16 1533 08/05/16 0349 08/06/16 0340  WBC 9.4 10.7* 10.5  NEUTROABS 8.2*  --   --   HGB 11.2* 9.7* 9.8*  HCT 37.7 32.5* 31.8*  MCV 77.3* 76.5* 77.6*  PLT 259 258 237   Cardiac Enzymes:  Recent Labs Lab 08/03/16 1925 08/04/16 1119 08/04/16 1746  TROPONINI 2.51* 10.19* 12.53*   BNP: Invalid input(s): POCBNP CBG:  Recent Labs Lab 08/01/16 0747 08/02/16 0733  GLUCAP 96 96    Time coordinating discharge:  Greater than 30 minutes  Signed:  Mikia Delaluz, DO [facility] Pager: [phone] 08/07/2016, 11:14 AM

## 2016-08-07 NOTE — Progress Notes (Signed)
Pt has just left via [facility]  .She will be  To a Hospice facility in [facility]

## 2016-08-07 NOTE — Progress Notes (Signed)
SLP Cancellation Note  Patient Details Name: [name] MRN: [id number] DOB: 1918/03/28   Cancelled treatment:       Reason Eval/Treat Not Completed: Other (comment) (pt now full comfort care, will sign off, please reorder if indicated)   Mills Koller, MS [facility] SLP [phone]

## 2016-08-07 NOTE — Progress Notes (Signed)
Patient ID: [name], female   DOB: [birth year], 83 y.o.   MRN: [id number]  This NP visited patient at the bedside as a follow up for palliative needs.  Spoke with daughter and SIL, plan is for disposition to hospice facility today.  Discussed with patient the importance of continued conversation with family and their  medical providers regarding overall plan of care and treatment options,  ensuring decisions are within the context of the patients values and GOCs.  Focus of care is comfort and dignity.  Ordered comfort feeds as tolerated, patient "really enjoyed" a coke.  Questions and concerns addressed  Time in      0800     Time out  0820   Total time spent on the unit was 10 min  Greater than 50% of the time was spent in counseling and coordination of care  Lorinda Creed NP  Palliative Medicine Team Team Phone # [phone] Pager [phone]

## 2016-08-07 NOTE — Care Management Important Message (Signed)
Important Message  Patient Details  Name: KAMSIYOCHUKWU BUIST MRN: [id number] Date of Birth: May 12, 1917   Medicare Important Message Given:  Yes    Caren Macadam 08/07/2016, 11:04 AMImportant Message  Patient Details  Name: LARIN DEPAOLI MRN: [id number] Date of Birth: 02-01-1918   Medicare Important Message Given:  Yes    Caren Macadam 08/07/2016, 11:04 AM

## 2016-08-07 NOTE — Progress Notes (Signed)
LCSW following for discharge planning/disposition:  Pt has bed at [facility] today er Arline Asp at [facility] admissions.  Patient will transport by [facility]. CSW completed medical necessity form and called for transport.  Family notified regarding discharge - family at bedside and are agreeable to plan. All information sent to facility by HUB Report number for RN:  [phone]  No other needs at this time   Plan: DC to [facility].   Ilean Skill, MSW, LCSW Clinical Social Work 08/07/2016 [phone]

## 2016-08-07 NOTE — Clinical Social Work Note (Signed)
Clinical Social Work Assessment  Patient Details  Name: [name] MRN: [id number] Date of Birth: December 09, 1917  Date of referral:  08/07/16               Reason for consult:  End of Life/Hospice                Permission sought to share information with:  Family Supports Permission granted to share information::     Name::     Nila Nephew  Agency::     Relationship::  daughter  Contact Information:  [phone]  Housing/Transportation Living arrangements for the past 2 months:  Single Family Home Source of Information:  Adult Children, Medical Team, Palliative Care Team Patient Interpreter Needed:  None Criminal Activity/Legal Involvement Pertinent to Current Situation/Hospitalization:  No - Comment as needed Significant Relationships:  Adult Children, Other Family Members Lives with:  Adult Children Do you feel safe going back to the place where you live?  Yes Need for family participation in patient care:  Yes (Comment) (daughter primary decision maker)  Care giving concerns:  Pt from home with family. Palliative care involved and family interested in pursuing full comfort care at this time. Daughter Lelan Pons requests [facility] Worker assessment / plan:  CSW consulted for residential hospice home placement.  Met with pt at bedside- she is pleasant but disoriented to situation. Spoke with pt's daughter Lelan Pons who requests referral to [facility].  [place] and sent pt's information for review. Hospice staff will reach out to pt's family as well.   Awaiting outcome of hospice home referral, will continue following.   Employment status:  Retired Nurse, adult PT Recommendations:  Not assessed at this time Information / Referral to community resources:   [facility])  Patient/Family's Response to care:  Appreciative of care.   Patient/Family's Understanding of and Emotional Response  to Diagnosis, Current Treatment, and Prognosis:  Family demonstrates adequate understanding of prognosis and plan. Calm and accepting at time of conversation with CSW.   Emotional Assessment Appearance:  Appears stated age Attitude/Demeanor/Rapport:   (appropriate) Affect (typically observed):  Calm, Accepting Orientation:  Oriented to Self, Oriented to Place Alcohol / Substance use:    Psych involvement (Current and /or in the community):  No (Comment)  Discharge Needs  Concerns to be addressed:  Discharge Planning Concerns Readmission within the last 30 days:  Yes Current discharge risk:  Terminally ill Barriers to Discharge:  No Barriers Identified- awaiting outcome of hospice home referral   Nila Nephew, LCSW 08/07/2016, 11:45 AM  [phone]

## 2016-08-09 ENCOUNTER — Encounter (HOSPITAL_COMMUNITY)
Admission: RE | Admit: 2016-08-09 | Discharge: 2016-08-09 | Disposition: A | Discharge status: Home / Self Care | Payer: Medicare Other | Source: Skilled Nursing Facility | Attending: Internal Medicine | Admitting: Internal Medicine

## 2016-08-09 DIAGNOSIS — I10 Essential (primary) hypertension: Secondary | ICD-10-CM | POA: Insufficient documentation

## 2016-08-09 DIAGNOSIS — I5032 Chronic diastolic (congestive) heart failure: Secondary | ICD-10-CM | POA: Insufficient documentation

## 2016-08-09 DIAGNOSIS — D509 Iron deficiency anemia, unspecified: Secondary | ICD-10-CM | POA: Insufficient documentation

## 2016-08-10 DIAGNOSIS — I469 Cardiac arrest, cause unspecified: Secondary | ICD-10-CM | POA: Diagnosis not present

## 2016-09-07 DIAGNOSIS — 419620001 Death: Secondary | SNOMED CT | POA: Diagnosis not present

## 2016-09-07 DEATH — deceased
